# Patient Record
Sex: Male | Born: 1960
Health system: Southern US, Community
[De-identification: ages and names within clinical notes are randomized; demographics above are authoritative.]

## PROBLEM LIST (undated history)

## (undated) DIAGNOSIS — I509 Heart failure, unspecified: Secondary | ICD-10-CM

## (undated) DIAGNOSIS — I10 Essential (primary) hypertension: Secondary | ICD-10-CM

## (undated) DIAGNOSIS — E119 Type 2 diabetes mellitus without complications: Secondary | ICD-10-CM

## (undated) DIAGNOSIS — M1990 Unspecified osteoarthritis, unspecified site: Secondary | ICD-10-CM

## (undated) DIAGNOSIS — K219 Gastro-esophageal reflux disease without esophagitis: Secondary | ICD-10-CM

## (undated) HISTORY — PX: OTHER SURGICAL HISTORY: SHX169

---

## 1998-09-07 ENCOUNTER — Encounter: Payer: Self-pay | Admitting: Emergency Medicine

## 1998-09-07 ENCOUNTER — Emergency Department (HOSPITAL_COMMUNITY): Admission: EM | Admit: 1998-09-07 | Discharge: 1998-09-07 | Payer: Self-pay | Admitting: Emergency Medicine

## 2017-10-18 LAB — HM COLONOSCOPY

## 2019-03-13 ENCOUNTER — Emergency Department (HOSPITAL_COMMUNITY): Payer: Self-pay

## 2019-03-13 ENCOUNTER — Emergency Department (HOSPITAL_COMMUNITY)
Admission: EM | Admit: 2019-03-13 | Discharge: 2019-03-13 | Disposition: A | Discharge status: Home / Self Care | Payer: Self-pay | Attending: Emergency Medicine | Admitting: Emergency Medicine

## 2019-03-13 ENCOUNTER — Encounter (HOSPITAL_COMMUNITY): Payer: Self-pay | Admitting: Emergency Medicine

## 2019-03-13 ENCOUNTER — Other Ambulatory Visit: Payer: Self-pay

## 2019-03-13 DIAGNOSIS — N5089 Other specified disorders of the male genital organs: Secondary | ICD-10-CM

## 2019-03-13 DIAGNOSIS — I10 Essential (primary) hypertension: Secondary | ICD-10-CM | POA: Insufficient documentation

## 2019-03-13 DIAGNOSIS — N50812 Left testicular pain: Secondary | ICD-10-CM | POA: Insufficient documentation

## 2019-03-13 DIAGNOSIS — N50819 Testicular pain, unspecified: Secondary | ICD-10-CM

## 2019-03-13 DIAGNOSIS — F17210 Nicotine dependence, cigarettes, uncomplicated: Secondary | ICD-10-CM | POA: Insufficient documentation

## 2019-03-13 HISTORY — DX: Essential (primary) hypertension: I10

## 2019-03-13 LAB — CBC WITH DIFFERENTIAL/PLATELET
Abs Immature Granulocytes: 0.03 10*3/uL (ref 0.00–0.07)
Basophils Absolute: 0 10*3/uL (ref 0.0–0.1)
Basophils Relative: 0 %
Eosinophils Absolute: 0.1 10*3/uL (ref 0.0–0.5)
Eosinophils Relative: 1 %
HCT: 44.3 % (ref 39.0–52.0)
Hemoglobin: 14.3 g/dL (ref 13.0–17.0)
Immature Granulocytes: 0 %
Lymphocytes Relative: 36 %
Lymphs Abs: 2.6 10*3/uL (ref 0.7–4.0)
MCH: 28 pg (ref 26.0–34.0)
MCHC: 32.3 g/dL (ref 30.0–36.0)
MCV: 86.7 fL (ref 80.0–100.0)
Monocytes Absolute: 0.6 10*3/uL (ref 0.1–1.0)
Monocytes Relative: 8 %
Neutro Abs: 3.8 10*3/uL (ref 1.7–7.7)
Neutrophils Relative %: 55 %
Platelets: 284 10*3/uL (ref 150–400)
RBC: 5.11 MIL/uL (ref 4.22–5.81)
RDW: 12.8 % (ref 11.5–15.5)
WBC: 7 10*3/uL (ref 4.0–10.5)
nRBC: 0 % (ref 0.0–0.2)

## 2019-03-13 LAB — URINALYSIS, ROUTINE W REFLEX MICROSCOPIC
Bacteria, UA: NONE SEEN
Bilirubin Urine: NEGATIVE
Glucose, UA: 150 mg/dL — AB
Hgb urine dipstick: NEGATIVE
Ketones, ur: NEGATIVE mg/dL
Leukocytes,Ua: NEGATIVE
Nitrite: NEGATIVE
Protein, ur: 100 mg/dL — AB
Specific Gravity, Urine: 1.02 (ref 1.005–1.030)
pH: 5 (ref 5.0–8.0)

## 2019-03-13 LAB — COMPREHENSIVE METABOLIC PANEL
ALT: 14 U/L (ref 0–44)
AST: 18 U/L (ref 15–41)
Albumin: 3.6 g/dL (ref 3.5–5.0)
Alkaline Phosphatase: 69 U/L (ref 38–126)
Anion gap: 9 (ref 5–15)
BUN: 12 mg/dL (ref 6–20)
CO2: 24 mmol/L (ref 22–32)
Calcium: 9.2 mg/dL (ref 8.9–10.3)
Chloride: 106 mmol/L (ref 98–111)
Creatinine, Ser: 1.49 mg/dL — ABNORMAL HIGH (ref 0.61–1.24)
GFR calc Af Amer: 58 mL/min — ABNORMAL LOW (ref >60)
GFR calc non Af Amer: 50 mL/min — ABNORMAL LOW (ref >60)
Glucose, Bld: 194 mg/dL — ABNORMAL HIGH (ref 70–99)
Potassium: 4 mmol/L (ref 3.5–5.1)
Sodium: 139 mmol/L (ref 135–145)
Total Bilirubin: 0.5 mg/dL (ref 0.3–1.2)
Total Protein: 7.6 g/dL (ref 6.5–8.1)

## 2019-03-13 MED ORDER — HYDROCODONE-ACETAMINOPHEN 5-325 MG PO TABS: 1.0000 | ORAL_TABLET | Freq: Four times a day (QID) | ORAL | 0 refills | Status: DC | PRN

## 2019-03-13 MED ORDER — SULFAMETHOXAZOLE-TRIMETHOPRIM 800-160 MG PO TABS: 1.0000 | ORAL_TABLET | Freq: Two times a day (BID) | ORAL | 0 refills | Status: AC

## 2019-03-13 NOTE — Discharge Instructions (Addendum)
Return here as needed.  Call the urologist tomorrow for an appointment.

## 2019-03-13 NOTE — ED Provider Notes (Signed)
[place] EMERGENCY DEPARTMENT Provider Note   CSN: [id number] Arrival date & time: 03/13/19  1148     History   Chief Complaint Chief Complaint  Patient presents with  . Testicle Pain  . Dysuria    HPI Rodney Schultz is a 58 y.o. male.     HPI Patient presents to the emergency department with a right testicle swelling and dysuria.  Patient states he was in prison when he had surgery for boils in 2018 he states when he came out his right testicle was in the inguinal region.  Patient states he has been having intermittent pain since that time.  He states he got worse over the last 2 days.  The patient states that nothing seems to make the condition better.  Patient states that palpation makes the pain worse.  Patient states that he did not commit to having this type of procedure done and is unsure of why it was done.  The patient denies chest pain, shortness of breath, headache,blurred vision, neck pain, fever, cough, weakness, numbness, dizziness, anorexia, edema, abdominal pain, nausea, vomiting, diarrhea, rash, back pain, dysuria, hematemesis, bloody stool, near syncope, or syncope. Past Medical History:  Diagnosis Date  . Hypertension     There are no active problems to display for this patient.   Past Surgical History:  Procedure Laterality Date  . testical  Right         Home Medications    Prior to Admission medications   Medication Sig Start Date End Date Taking? Authorizing Provider  HYDROcodone-acetaminophen (NORCO/VICODIN) 5-325 MG tablet Take 1 tablet by mouth every 6 (six) hours as needed for moderate pain. 03/13/19   Emmanuelle Coxe, Harrell Gave, PA-C  sulfamethoxazole-trimethoprim (BACTRIM DS) 800-160 MG tablet Take 1 tablet by mouth 2 (two) times daily for 7 days. 03/13/19 03/20/19  Dalia Heading, PA-C    Family History No family history on file.  Social History Social History   Tobacco Use  . Smoking status: Light Tobacco Smoker  .  Smokeless tobacco: Never Used  Substance Use Topics  . Alcohol use: Not Currently  . Drug use: Not Currently     Allergies   Patient has no known allergies.   Review of Systems Review of Systems All other systems negative except as documented in the HPI. All pertinent positives and negatives as reviewed in the HPI.  Physical Exam Updated Vital Signs BP (!) 199/113   Pulse 69   Temp 98.4 F (36.9 C) (Oral)   Resp 18   SpO2 100%   Physical Exam Vitals signs and nursing note reviewed.  Constitutional:      General: He is not in acute distress.    Appearance: He is well-developed.  HENT:     Head: Normocephalic and atraumatic.  Eyes:     Pupils: Pupils are equal, round, and reactive to light.  Neck:     Musculoskeletal: Normal range of motion and neck supple.  Cardiovascular:     Rate and Rhythm: Normal rate and regular rhythm.     Heart sounds: Normal heart sounds. No murmur. No friction rub. No gallop.   Pulmonary:     Effort: Pulmonary effort is normal. No respiratory distress.     Breath sounds: Normal breath sounds. No wheezing.  Abdominal:     General: Bowel sounds are normal. There is no distension.     Palpations: Abdomen is soft.     Tenderness: There is no abdominal tenderness.  Skin:  General: Skin is warm and dry.     Capillary Refill: Capillary refill takes less than 2 seconds.     Findings: No erythema or rash.  Neurological:     Mental Status: He is alert and oriented to person, place, and time.     Motor: No abnormal muscle tone.     Coordination: Coordination normal.  Psychiatric:        Behavior: Behavior normal.      ED Treatments / Results  Labs (all labs ordered are listed, but only abnormal results are displayed) Labs Reviewed  COMPREHENSIVE METABOLIC PANEL - Abnormal; Notable for the following components:      Result Value   Glucose, Bld 194 (*)    Creatinine, Ser 1.49 (*)    GFR calc non Af Amer 50 (*)    GFR calc Af Amer 58  (*)    All other components within normal limits  URINALYSIS, ROUTINE W REFLEX MICROSCOPIC - Abnormal; Notable for the following components:   Glucose, UA 150 (*)    Protein, ur 100 (*)    All other components within normal limits  URINE CULTURE  CBC WITH DIFFERENTIAL/PLATELET    EKG None  Radiology US Scrotum W/doppler  Result Date: 03/13/2019 CLINICAL DATA:  Swelling and pain EXAM: SCROTAL ULTRASOUND DOPPLER ULTRASOUND OF THE TESTICLES TECHNIQUE: Complete ultrasound examination of the testicles, epididymis, and other scrotal structures was performed. Color and spectral Doppler ultrasound were also utilized to evaluate blood flow to the testicles. COMPARISON:  None. FINDINGS: Right testicle Measurements: 3.9 x 1.2 x 2.7 cm. Right testicle is positioned high within the right groin/inguinal canal. No mass or microlithiasis. Left testicle Measurements: 4.3 x 2.1 x 3.0 cm. No mass or microlithiasis visualized. Right epididymis:  Not visualized. Left epididymis:  Normal in size and appearance. Hydrocele:  Small left hydrocele. Varicocele:  None visualized. Pulsed Doppler interrogation of both testes demonstrates normal low resistance arterial and venous waveforms bilaterally. IMPRESSION: No evidence of testicular mass or torsion. Right testicle appears positioned within the right inguinal canal. Correlate with patient history. Nonvisualization of the right epididymis. Electronically Signed   By: Kreg Shropshire M.D.   On: 03/13/2019 14:39    Procedures Procedures (including critical care time)  Medications Ordered in ED Medications - No data to display   Initial Impression / Assessment and Plan / ED Course  I have reviewed the triage vital signs and the nursing notes.  Pertinent labs & imaging results that were available during my care of the patient were reviewed by me and considered in my medical decision making (see chart for details).       I spoke with Dr. Alvester Morin of urology who will  follow up with the patient as an outpatient.  Patient was placed on antibiotics at his request.  Told to return here for any worsening in his condition.  Patient agrees the plan and all questions were answered.     Final Clinical Impressions(s) / ED Diagnoses   Final diagnoses:  Testicular pain    ED Discharge Orders         Ordered    sulfamethoxazole-trimethoprim (BACTRIM DS) 800-160 MG tablet  2 times daily     03/13/19 1731    HYDROcodone-acetaminophen (NORCO/VICODIN) 5-325 MG tablet  Every 6 hours PRN     03/13/19 1731           Charlestine Night, PA-C 03/15/19 1524    Tegeler, Canary Brim, MD 03/15/19 [phone]

## 2019-03-13 NOTE — ED Triage Notes (Signed)
Pt reports left testicle pain with swelling and dysuria. He reports having surgery for boils while in prison last year and states "I woke up with my right testicle missing, I've been in pain ever since." Denies fever or changes in color of urine.

## 2019-03-14 LAB — URINE CULTURE: Culture: <10000 — AB

## 2019-03-17 NOTE — Progress Notes (Signed)
Patient ID: Rodney Schultz, male   DOB: Aug 03, 1960, 58 y.o.   MRN: [id number]  Virtual Visit via Telephone Note  I connected with Rodney Schultz on 03/21/19 at  8:50 AM EDT by telephone and verified that I am speaking with the correct person using two identifiers.   I discussed the limitations, risks, security and privacy concerns of performing an evaluation and management service by telephone and the availability of in person appointments. I also discussed with the patient that there may be a patient responsible charge related to this service. The patient expressed understanding and agreed to proceed.  Patient location:  home My Location:  [facility] office Persons on the call: me and the patient   History of Present Illness: After ED visit 03/13/2019 for testicular pain.  Urology was consulted.  He was prescribed sulfa.  Glucose was 150.  He has an appointment with urology tomorrow.  Pain has improved some.  Got out of prison 02/21/2019.  Last A1C was 7.0 in prison in August.  Blood sugars at home running 120-180.  No fever.  No penile discharge.  Needs financial packet.     HPI Patient presents to the emergency department with a right testicle swelling and dysuria.  Patient states he was in prison when he had surgery for boils in 2018 he states when he came out his right testicle was in the inguinal region.  Patient states he has been having intermittent pain since that time.  He states he got worse over the last 2 days.  The patient states that nothing seems to make the condition better.  Patient states that palpation makes the pain worse.  Patient states that he did not commit to having this type of procedure done and is unsure of why it was done.  The patient denies chest pain, shortness of breath, headache,blurred vision, neck pain, fever, cough, weakness, numbness, dizziness, anorexia, edema, abdominal pain, nausea, vomiting, diarrhea, rash, back pain, dysuria, hematemesis, bloody stool, near  syncope, or syncope.    Observations/Objective:  A&Ox3. NAD   Assessment and Plan: 1. Type 2 diabetes mellitus with hyperglycemia, unspecified whether long term insulin use (HCC) Not at [facility] on diet/exercise.  Last A1C=7.0 1 month ago - metFORMIN (GLUMETZA) 500 MG (MOD) 24 hr tablet; Take 2 tablets (1,000 mg total) by mouth daily with breakfast.  Dispense: 60 tablet; Refill: 3 - glipiZIDE (GLUCOTROL) 5 MG tablet; Take 1 tablet (5 mg total) by mouth daily before breakfast.  Dispense: 30 tablet; Refill: 3  2. Gastroesophageal reflux disease without esophagitis stable - omeprazole (PRILOSEC) 40 MG capsule; Take 1 capsule (40 mg total) by mouth daily.  Dispense: 30 capsule; Refill: 3  3. Encounter for examination following treatment at hospital improving  4. Testicular pain Keep appt with urology    Follow Up Instructions: Assign PCP in 2 months and check labs/A1C   I discussed the assessment and treatment plan with the patient. The patient was provided an opportunity to ask questions and all were answered. The patient agreed with the plan and demonstrated an understanding of the instructions.   The patient was advised to call back or seek an in-person evaluation if the symptoms worsen or if the condition fails to improve as anticipated.  I provided 11 minutes of non-face-to-face time during this encounter.   Freeman Caldron, PA-C

## 2019-03-21 ENCOUNTER — Other Ambulatory Visit: Payer: Self-pay

## 2019-03-21 ENCOUNTER — Other Ambulatory Visit: Payer: Self-pay | Admitting: Pharmacist

## 2019-03-21 ENCOUNTER — Ambulatory Visit: Payer: Self-pay | Attending: Family Medicine | Admitting: Physician Assistant

## 2019-03-21 DIAGNOSIS — Z09 Encounter for follow-up examination after completed treatment for conditions other than malignant neoplasm: Secondary | ICD-10-CM

## 2019-03-21 DIAGNOSIS — E1165 Type 2 diabetes mellitus with hyperglycemia: Secondary | ICD-10-CM

## 2019-03-21 DIAGNOSIS — N50819 Testicular pain, unspecified: Secondary | ICD-10-CM

## 2019-03-21 DIAGNOSIS — K219 Gastro-esophageal reflux disease without esophagitis: Secondary | ICD-10-CM

## 2019-03-21 DIAGNOSIS — E118 Type 2 diabetes mellitus with unspecified complications: Secondary | ICD-10-CM | POA: Insufficient documentation

## 2019-03-21 MED ORDER — METFORMIN HCL ER 500 MG PO TB24: 1000.0000 mg | ORAL_TABLET | Freq: Every day | ORAL | 2 refills | Status: DC

## 2019-03-21 MED ORDER — OMEPRAZOLE 40 MG PO CPDR: 40.0000 mg | DELAYED_RELEASE_CAPSULE | Freq: Every day | ORAL | 3 refills | Status: DC

## 2019-03-21 MED ORDER — GLIPIZIDE 5 MG PO TABS: 5.0000 mg | ORAL_TABLET | Freq: Every day | ORAL | 3 refills | Status: DC

## 2019-03-21 MED ORDER — METFORMIN HCL ER (MOD) 500 MG PO TB24: 1000.0000 mg | ORAL_TABLET | Freq: Every day | ORAL | 3 refills | Status: DC

## 2019-03-21 MED FILL — METFORMIN HCL ER 500 MG TB2: 500 | 30 days supply | Qty: 60 | Fill #0

## 2019-03-21 MED FILL — glipiZIDE 5 MG TABS: 5 | 30 days supply | Qty: 30 | Fill #0

## 2019-03-21 MED FILL — OMEPRAZOLE DR 40 MG CAPSULE: 40 | 30 days supply | Qty: 30 | Fill #0

## 2019-03-24 ENCOUNTER — Other Ambulatory Visit: Payer: Self-pay | Admitting: Urology

## 2019-03-31 ENCOUNTER — Telehealth: Payer: Self-pay | Admitting: General Practice

## 2019-03-31 ENCOUNTER — Ambulatory Visit: Payer: Self-pay

## 2019-03-31 NOTE — Telephone Encounter (Signed)
Attempted to leave pt VM to schedule appt

## 2019-03-31 NOTE — Telephone Encounter (Signed)
-----   Message from Debbra Riding, RMA sent at 03/21/2019  8:42 AM EDT ----- Regarding: 2 MONTHS EST CARE Please schedule this patient

## 2019-04-06 NOTE — Patient Instructions (Addendum)
DUE TO COVID-19 ONLY ONE VISITOR IS ALLOWED TO COME WITH YOU AND STAY IN THE WAITING ROOM ONLY DURING PRE OP AND PROCEDURE DAY OF SURGERY. THE 1 VISITOR MAY VISIT WITH YOU AFTER SURGERY IN YOUR PRIVATE ROOM DURING VISITING HOURS ONLY!  YOU NEED TO HAVE A COVID 19 TEST ON 04-08-19  @ 9:15 AM, THIS TEST MUST BE DONE BEFORE SURGERY, COME  [address],  [place] , [id number].  [facility] ONCE YOUR COVID TEST IS COMPLETED, PLEASE BEGIN THE QUARANTINE INSTRUCTIONS AS OUTLINED IN YOUR HANDOUT.                Rodney Schultz  Schultz   Your procedure is scheduled on:    [facility]    Report to Admitting at 8:30 AM     Call this number if you have problems the morning of surgery [phone]    Remember: Do not eat food or drink liquids :After Midnight.      Take these medicines the morning of surgery with A SIP OF WATER: Amlodipine (Norvasc),  Loratadine (Claritin), and Omeprazole (Prilosec)  BRUSH YOUR TEETH MORNING OF SURGERY AND RINSE YOUR MOUTH OUT, NO CHEWING GUM CANDY OR MINTS.  DO NOT TAKE ANY DIABETIC MEDICATIONS DAY OF YOUR SURGERY                               You may not have any metal on your body including hair pins and              piercings     Do not wear jewelry, cologne, lotions, powders or deodorant                        Men may shave face and neck.   Do not bring valuables to the hospital. [facility] IS NOT             RESPONSIBLE   FOR VALUABLES.  Contacts, dentures or bridgework may not be worn into surgery.  Leave suitcase in the car. After surgery it may be brought to your room.     Patients discharged the day of surgery will not be allowed to drive home. IF YOU ARE HAVING SURGERY AND GOING HOME THE SAME DAY, YOU MUST HAVE AN ADULT TO DRIVE YOU HOME AND BE WITH YOU FOR 24 HOURS. YOU MAY GO HOME BY TAXI OR UBER OR ORTHERWISE, BUT AN ADULT MUST ACCOMPANY YOU HOME AND STAY WITH YOU FOR 24  HOURS.    Name and phone number of your driver: Rodney AlmasWillie Schultz [phone]  Special Instructions: N/A              Please read over the following fact sheets you were given: _____________________________________________________________________             How to Manage Your Diabetes Before and After Surgery  Why is it important to control my blood sugar before and after surgery? . Improving blood sugar levels before and after surgery helps healing and can limit problems. . A way of improving blood sugar control is eating a healthy diet by: o  Eating less sugar and carbohydrates o  Increasing activity/exercise o  Talking with your doctor about reaching your blood sugar goals . High blood sugars (greater than 180 mg/dL) can raise your risk of infections and slow your recovery, so you will need  to focus on controlling your diabetes during the weeks before surgery. . Make sure that the doctor who takes care of your diabetes knows about your planned surgery including the date and location.  How do I manage my blood sugar before surgery? . Check your blood sugar at least 4 times a day, starting 2 days before surgery, to make sure that the level is not too high or low. o Check your blood sugar the morning of your surgery when you wake up and every 2 hours until you get to the Short Stay unit. . If your blood sugar is less than 70 mg/dL, you will need to treat for low blood sugar: o Do not take insulin. o Treat a low blood sugar (less than 70 mg/dL) with  cup of clear juice (cranberry or apple), 4 glucose tablets, OR glucose gel. o Recheck blood sugar in 15 minutes after treatment (to make sure it is greater than 70 mg/dL). If your blood sugar is not greater than 70 mg/dL on recheck, call [phone] for further instructions. . Report your blood sugar to the short stay nurse when you get to Short Stay.  . If you are admitted to the hospital after surgery: o Your blood sugar will be  checked by the staff and you will probably be given insulin after surgery (instead of oral diabetes medicines) to make sure you have good blood sugar levels. o The goal for blood sugar control after surgery is 80-180 mg/dL.   WHAT DO I DO ABOUT MY DIABETES MEDICATION?  Marland Kitchen Do not take oral diabetes medicines (pills) the morning of surgery.  . THE DAY BEFORE SURGERY, take your usual dose of Actos and Metformin, but only your morning/lunch dose of Glipizide       Reviewed and Endorsed by [facility] Patient Education Committee, August 2015   [facility] - Preparing for Surgery Before surgery, you can play an important role.  Because skin is not sterile, your skin needs to be as free of germs as possible.  You can reduce the number of germs on your skin by washing with CHG (chlorahexidine gluconate) soap before surgery.  CHG is an antiseptic cleaner which kills germs and bonds with the skin to continue killing germs even after washing. Please DO NOT use if you have an allergy to CHG or antibacterial soaps.  If your skin becomes reddened/irritated stop using the CHG and inform your nurse when you arrive at Short Stay. Do not shave (including legs and underarms) for at least 48 hours prior to the first CHG shower.  You may shave your face/neck. Please follow these instructions carefully:  1.  Shower with CHG Soap the night before surgery and the  morning of Surgery.  2.  If you choose to wash your hair, wash your hair first as usual with your  normal  shampoo.  3.  After you shampoo, rinse your hair and body thoroughly to remove the  shampoo.                           4.  Use CHG as you would any other liquid soap.  You can apply chg directly  to the skin and wash                       Gently with a scrungie or clean washcloth.  5.  Apply the CHG Soap to your body ONLY FROM THE NECK  DOWN.   Do not use on face/ open                           Wound or open sores. Avoid contact with eyes, ears mouth  and genitals (private parts).                       Wash face,  Genitals (private parts) with your normal soap.             6.  Wash thoroughly, paying special attention to the area where your surgery  will be performed.  7.  Thoroughly rinse your body with warm water from the neck down.  8.  DO NOT shower/wash with your normal soap after using and rinsing off  the CHG Soap.                9.  Pat yourself dry with a clean towel.            10.  Wear clean pajamas.            11.  Place clean sheets on your bed the night of your first shower and do not  sleep with pets. Day of Surgery : Do not apply any lotions/deodorants the morning of surgery.  Please wear clean clothes to the hospital/surgery center.  FAILURE TO FOLLOW THESE INSTRUCTIONS MAY RESULT IN THE CANCELLATION OF YOUR SURGERY PATIENT SIGNATURE_________________________________  NURSE SIGNATURE__________________________________  ________________________________________________________________________

## 2019-04-06 NOTE — Progress Notes (Addendum)
PCP - Pt did not know the name of the provider. Only the location of the clinic Cardiologist -   Chest x-ray -  EKG - 04-07-19 Stress Test -  ECHO -  Cardiac Cath -   Sleep Study -  CPAP -   Fasting Blood Sugar - 338 Checks Blood Sugar _____ times a day (Pt doesnot check blood glucose  Blood Thinner Instructions: Aspirin Instructions: Last Dose:  Anesthesia review:  HGA1C 8.6  Patient denies shortness of breath, fever, cough and chest pain at PAT appointment   Patient verbalized understanding of instructions that were given to them at the PAT appointment. Patient was also instructed that they will need to review over the PAT instructions again at home before surgery.

## 2019-04-07 ENCOUNTER — Other Ambulatory Visit: Payer: Self-pay

## 2019-04-07 ENCOUNTER — Encounter (HOSPITAL_COMMUNITY)
Admission: RE | Admit: 2019-04-07 | Discharge: 2019-04-07 | Disposition: A | Discharge status: Home / Self Care | Payer: Self-pay | Source: Ambulatory Visit | Attending: Urology | Admitting: Urology

## 2019-04-07 ENCOUNTER — Encounter (HOSPITAL_COMMUNITY): Payer: Self-pay

## 2019-04-07 DIAGNOSIS — I10 Essential (primary) hypertension: Secondary | ICD-10-CM | POA: Insufficient documentation

## 2019-04-07 DIAGNOSIS — Z01818 Encounter for other preprocedural examination: Secondary | ICD-10-CM | POA: Insufficient documentation

## 2019-04-07 DIAGNOSIS — Q5522 Retractile testis: Secondary | ICD-10-CM | POA: Insufficient documentation

## 2019-04-07 DIAGNOSIS — E118 Type 2 diabetes mellitus with unspecified complications: Secondary | ICD-10-CM | POA: Insufficient documentation

## 2019-04-07 DIAGNOSIS — N50811 Right testicular pain: Secondary | ICD-10-CM | POA: Insufficient documentation

## 2019-04-07 HISTORY — DX: Type 2 diabetes mellitus without complications: E11.9

## 2019-04-07 HISTORY — DX: Gastro-esophageal reflux disease without esophagitis: K21.9

## 2019-04-07 LAB — CBC
HCT: 45.5 % (ref 39.0–52.0)
Hemoglobin: 14.9 g/dL (ref 13.0–17.0)
MCH: 28.2 pg (ref 26.0–34.0)
MCHC: 32.7 g/dL (ref 30.0–36.0)
MCV: 86 fL (ref 80.0–100.0)
Platelets: 270 10*3/uL (ref 150–400)
RBC: 5.29 MIL/uL (ref 4.22–5.81)
RDW: 12.7 % (ref 11.5–15.5)
WBC: 6.5 10*3/uL (ref 4.0–10.5)
nRBC: 0 % (ref 0.0–0.2)

## 2019-04-07 LAB — BASIC METABOLIC PANEL
Anion gap: 9 (ref 5–15)
BUN: 13 mg/dL (ref 6–20)
CO2: 27 mmol/L (ref 22–32)
Calcium: 9.3 mg/dL (ref 8.9–10.3)
Chloride: 102 mmol/L (ref 98–111)
Creatinine, Ser: 1.35 mg/dL — ABNORMAL HIGH (ref 0.61–1.24)
GFR calc Af Amer: >60 mL/min (ref >60)
GFR calc non Af Amer: 57 mL/min — ABNORMAL LOW (ref >60)
Glucose, Bld: 343 mg/dL — ABNORMAL HIGH (ref 70–99)
Potassium: 4.3 mmol/L (ref 3.5–5.1)
Sodium: 138 mmol/L (ref 135–145)

## 2019-04-07 LAB — GLUCOSE, CAPILLARY: Glucose-Capillary: 338 mg/dL — ABNORMAL HIGH (ref 70–99)

## 2019-04-07 NOTE — Progress Notes (Signed)
Elevated BP reading discussed with Iver Nestle, PA. Pt advised per Janett Billow to contact his PCP to see if BP medication can be adjusted, as surgery can potentially be cancelled if BP reading is elevated the day of surgery. Pt indicated that he is scheduled to see his PCP next week after surgery, but verbalized understanding of instructions provided.   Janett Billow indicated that she would also try to contact PCP, and surgeon's office as well.

## 2019-04-08 ENCOUNTER — Other Ambulatory Visit (HOSPITAL_COMMUNITY)
Admission: RE | Admit: 2019-04-08 | Discharge: 2019-04-08 | Disposition: A | Discharge status: Home / Self Care | Payer: HRSA Program | Source: Ambulatory Visit | Attending: Urology | Admitting: Urology

## 2019-04-08 DIAGNOSIS — Z20828 Contact with and (suspected) exposure to other viral communicable diseases: Secondary | ICD-10-CM | POA: Diagnosis not present

## 2019-04-08 DIAGNOSIS — Z01812 Encounter for preprocedural laboratory examination: Secondary | ICD-10-CM | POA: Diagnosis not present

## 2019-04-08 LAB — HEMOGLOBIN A1C
Hgb A1c MFr Bld: 8.6 % — ABNORMAL HIGH (ref 4.8–5.6)
Mean Plasma Glucose: 200 mg/dL

## 2019-04-09 LAB — NOVEL CORONAVIRUS, NAA (HOSP ORDER, SEND-OUT TO REF LAB; TAT 18-24 HRS): SARS-CoV-2, NAA: NOT DETECTED

## 2019-04-10 ENCOUNTER — Encounter (HOSPITAL_COMMUNITY): Payer: Self-pay | Admitting: Physician Assistant

## 2019-04-10 NOTE — Progress Notes (Addendum)
04-07-19 HGA1C result routed to Dr. Gloriann Loan for review

## 2019-04-11 NOTE — Anesthesia Preprocedure Evaluation (Deleted)
Anesthesia Evaluation    Airway        Dental   Pulmonary Current Smoker,           Cardiovascular hypertension,      Neuro/Psych    GI/Hepatic   Endo/Other  diabetes  Renal/GU      Musculoskeletal   Abdominal   Peds  Hematology   Anesthesia Other Findings   Reproductive/Obstetrics                             Anesthesia Physical Anesthesia Plan  ASA:   Anesthesia Plan:    Post-op Pain Management:    Induction:   PONV Risk Score and Plan:   Airway Management Planned:   Additional Equipment:   Intra-op Plan:   Post-operative Plan:   Informed Consent:   Plan Discussed with:   Anesthesia Plan Comments: (See PAT note 04/07/2019, Konrad Felix, PA-C)        Anesthesia Quick Evaluation

## 2019-04-11 NOTE — Progress Notes (Signed)
Anesthesia Chart Review   Case: [id number] Date/Time: 04/12/19 1015   Procedure: RIGHT GROIN  EXPLORATION POSSIBLE RIGHT ORCHIOPEXY POSSIBLE RIGHT ORCHIECTOMY (Right )   Anesthesia type: General   Pre-op diagnosis: RIGHT TESTICULAR PAIN   Location: [place] / [facility]   Surgeon: Lucas Mallow, MD      DISCUSSION:58 y.o. light tobacco smoker with h/o HTN, GERD, DM II, right testicular pain scheduled for above procedure 04/12/2019 with Dr. Link Snuffer.    Elevated BP at PAT visit.  Pt reports he was recently released from prison, is establishing care with PCP week after surgery.  Nurse advised pt to contact PCP to see if he could get in earlier for evaluation of BP.    Discussed with Dr. Marcell Barlow.  Unless case is emergent should be postponed until HTN and poorly controlled diabetes are better managed.  Voicemail left with Dr. Purvis Sheffield scheduler, informed case may be cancelled day of due to BP or CBG.   VS: BP (!) 213/116   Pulse 99   Temp 36.8 C (Oral)   Resp 18   Ht 5\' 10"  (1.778 m)   Wt 94.4 kg   SpO2 100%   BMI 29.87 kg/m   PROVIDERS: Patient, No Pcp Per   LABS: Surgeon made aware of blood glucose (all labs ordered are listed, but only abnormal results are displayed)  Labs Reviewed  BASIC METABOLIC PANEL - Abnormal; Notable for the following components:      Result Value   Glucose, Bld 343 (*)    Creatinine, Ser 1.35 (*)    GFR calc non Af Amer 57 (*)    All other components within normal limits  HEMOGLOBIN A1C - Abnormal; Notable for the following components:   Hgb A1c MFr Bld 8.6 (*)    All other components within normal limits  GLUCOSE, CAPILLARY - Abnormal; Notable for the following components:   Glucose-Capillary 338 (*)    All other components within normal limits  CBC     IMAGES:   EKG: 04/07/2019 Rate 74 bpm Normal sinus rhythm  Normal ECG   CV:  Past Medical History:  Diagnosis Date  . Diabetes mellitus without complication ([place])   .  GERD (gastroesophageal reflux disease)   . Hypertension     Past Surgical History:  Procedure Laterality Date  . testical  Right     MEDICATIONS: . amLODipine (NORVASC) 10 MG tablet  . glipiZIDE (GLUCOTROL) 10 MG tablet  . glipiZIDE (GLUCOTROL) 5 MG tablet  . HYDROcodone-acetaminophen (NORCO/VICODIN) 5-325 MG tablet  . loratadine (CLARITIN) 10 MG tablet  . Menthol-Methyl Salicylate (MUSCLE RUB EX)  . metFORMIN (GLUCOPHAGE-XR) 500 MG 24 hr tablet  . omeprazole (PRILOSEC) 20 MG capsule  . omeprazole (PRILOSEC) 40 MG capsule  . pioglitazone (ACTOS) 15 MG tablet   No current facility-administered medications for this encounter.      Maia Plan [facility] Pre-Surgical Testing [phone] 04/11/19 1:23 PM

## 2019-04-12 ENCOUNTER — Ambulatory Visit (HOSPITAL_COMMUNITY): Admission: RE | Admit: 2019-04-12 | Payer: Self-pay | Source: Home / Self Care | Admitting: Urology

## 2019-04-12 ENCOUNTER — Ambulatory Visit (HOSPITAL_COMMUNITY)
Admission: EM | Admit: 2019-04-12 | Discharge: 2019-04-12 | Disposition: A | Discharge status: Home / Self Care | Payer: Self-pay | Attending: Family Medicine | Admitting: Family Medicine

## 2019-04-12 ENCOUNTER — Encounter (HOSPITAL_COMMUNITY): Payer: Self-pay | Admitting: Emergency Medicine

## 2019-04-12 ENCOUNTER — Encounter (HOSPITAL_COMMUNITY): Admission: RE | Payer: Self-pay | Source: Home / Self Care

## 2019-04-12 ENCOUNTER — Other Ambulatory Visit: Payer: Self-pay

## 2019-04-12 DIAGNOSIS — E1165 Type 2 diabetes mellitus with hyperglycemia: Secondary | ICD-10-CM

## 2019-04-12 DIAGNOSIS — I10 Essential (primary) hypertension: Secondary | ICD-10-CM

## 2019-04-12 LAB — GLUCOSE, CAPILLARY: Glucose-Capillary: 411 mg/dL — ABNORMAL HIGH (ref 70–99)

## 2019-04-12 SURGERY — EXPLORATION, SCROTUM
Anesthesia: General | Laterality: Right

## 2019-04-12 MED ORDER — METFORMIN HCL ER 500 MG PO TB24: 1000.0000 mg | ORAL_TABLET | Freq: Two times a day (BID) | ORAL | 3 refills | Status: DC

## 2019-04-12 MED ORDER — INSULIN NPH ISOPHANE & REGULAR (70-30) 100 UNIT/ML ~~LOC~~ SUSP: 20.0000 [IU] | Freq: Every day | SUBCUTANEOUS | 11 refills | Status: DC

## 2019-04-12 MED ORDER — AMLODIPINE BESYLATE 10 MG PO TABS: 10.0000 mg | ORAL_TABLET | Freq: Every day | ORAL | 3 refills | Status: DC

## 2019-04-12 MED ORDER — "INSULIN SYRINGE 27G X 1/2"" 0.5 ML MISC": 1.0000 | Freq: Every day | 3 refills | Status: DC

## 2019-04-12 MED ORDER — GLIPIZIDE 10 MG PO TABS: 10.0000 mg | ORAL_TABLET | Freq: Every day | ORAL | 3 refills | Status: DC

## 2019-04-12 MED ORDER — PRAVASTATIN SODIUM 10 MG PO TABS: 10.0000 mg | ORAL_TABLET | Freq: Every day | ORAL | 3 refills | Status: DC

## 2019-04-12 NOTE — ED Triage Notes (Signed)
Pt presents to [facility] for assessment of hyperglycemia (390 this morning) and hypertension (denies headaches, weakness or dizziness).  States he just got out of prison and is out of his medications.

## 2019-04-12 NOTE — ED Provider Notes (Signed)
[facility]    CSN: [id number] Arrival date & time: 04/12/19  1058      History   Chief Complaint Chief Complaint  Patient presents with  . Hypertension  . Hyperglycemia    HPI Rodney Schultz is a 58 y.o. male.   This is the initial visit for this hypertensive diabetic patient to the [facility] urgent care office.  Pt presents to [facility] for assessment of hyperglycemia (390 this morning) and hypertension (denies headaches, weakness or dizziness).  States he just got out of prison and is out of his medications.      Past Medical History:  Diagnosis Date  . Diabetes mellitus without complication (HCC)   . GERD (gastroesophageal reflux disease)   . Hypertension     Patient Active Problem List   Diagnosis Date Noted  . Type 2 diabetes mellitus with hyperglycemia (HCC) 03/21/2019    Past Surgical History:  Procedure Laterality Date  . testical  Right        Home Medications    Prior to Admission medications   Medication Sig Start Date End Date Taking? Authorizing Provider  amLODipine (NORVASC) 10 MG tablet Take 1 tablet (10 mg total) by mouth daily. 04/12/19   Elvina Sidle, MD  glipiZIDE (GLUCOTROL) 10 MG tablet Take 1 tablet (10 mg total) by mouth daily before breakfast. 04/12/19   Elvina Sidle, MD  insulin NPH-regular Human (70-30) 100 UNIT/ML injection Inject 20 Units into the skin daily with breakfast. 04/12/19   Elvina Sidle, MD  Insulin Syringe 27G X 1/2" 0.5 ML MISC 1 Syringe by Does not apply route daily. 04/12/19   Elvina Sidle, MD  loratadine (CLARITIN) 10 MG tablet Take 10 mg by mouth daily as needed for allergies.    [provider]  Menthol-Methyl Salicylate (MUSCLE RUB EX) Apply 1 application topically 2 (two) times daily as needed (leg pain).    [provider]  metFORMIN (GLUCOPHAGE-XR) 500 MG 24 hr tablet Take 2 tablets (1,000 mg total) by mouth 2 (two) times daily. 04/12/19   Elvina Sidle, MD  pravastatin  (PRAVACHOL) 10 MG tablet Take 1 tablet (10 mg total) by mouth daily. Patient unsure of dose at this time 04/12/19   Elvina Sidle, MD  omeprazole (PRILOSEC) 20 MG capsule Take 20 mg by mouth daily.  04/12/19  [provider]  pioglitazone (ACTOS) 15 MG tablet Take 15 mg by mouth daily.  04/12/19  [provider]    Family History History reviewed. No pertinent family history.  Social History Social History   Tobacco Use  . Smoking status: Light Tobacco Smoker    Packs/day: 0.10  . Smokeless tobacco: Never Used  . Tobacco comment: Last smoked on 04/03/19. Pt reports that he only smoke when he is in pain  Substance Use Topics  . Alcohol use: Not Currently  . Drug use: Not Currently     Allergies   Patient has no known allergies.   Review of Systems Review of Systems   Physical Exam Triage Vital Signs ED Triage Vitals [04/12/19 1111]  Enc Vitals Group     BP (!) 185/119     Pulse Rate 98     Resp 18     Temp 97.6 F (36.4 C)     Temp Source Oral     SpO2 100 %     Weight      Height      Head Circumference      Peak Flow  Pain Score 0     Pain Loc      Pain Edu?      Excl. in [place]?    No data found.  Updated Vital Signs BP (!) 185/119 (BP Location: Right Arm)   Pulse 98   Temp 97.6 F (36.4 C) (Oral)   Resp 18   SpO2 100%    Physical Exam   UC Treatments / Results  Labs (all labs ordered are listed, but only abnormal results are displayed) Labs Reviewed  GLUCOSE, CAPILLARY - Abnormal; Notable for the following components:      Result Value   Glucose-Capillary 411 (*)    All other components within normal limits  CBG MONITORING, ED    EKG   Radiology No results found.  Procedures Procedures (including critical care time)  Medications Ordered in UC Medications - No data to display  Initial Impression / Assessment and Plan / UC Course  I have reviewed the triage vital signs and the nursing notes.  Pertinent labs &  imaging results that were available during my care of the patient were reviewed by me and considered in my medical decision making (see chart for details).    Final Clinical Impressions(s) / UC Diagnoses   Final diagnoses:  Essential hypertension  Uncontrolled type 2 diabetes mellitus with hyperglycemia [facility])   Discharge Instructions   None    ED Prescriptions    Medication Sig Dispense Auth. Provider   amLODipine (NORVASC) 10 MG tablet Take 1 tablet (10 mg total) by mouth daily. 90 tablet Robyn Haber, MD   glipiZIDE (GLUCOTROL) 10 MG tablet Take 1 tablet (10 mg total) by mouth daily before breakfast. 90 tablet Robyn Haber, MD   insulin NPH-regular Human (70-30) 100 UNIT/ML injection Inject 20 Units into the skin daily with breakfast. 10 mL Robyn Haber, MD   metFORMIN (GLUCOPHAGE-XR) 500 MG 24 hr tablet Take 2 tablets (1,000 mg total) by mouth 2 (two) times daily. 180 tablet Robyn Haber, MD   pravastatin (PRAVACHOL) 10 MG tablet Take 1 tablet (10 mg total) by mouth daily. Patient unsure of dose at this time 90 tablet Teffany Blaszczyk, Synetta Shadow, MD   Insulin Syringe 27G X 1/2" 0.5 ML MISC 1 Syringe by Does not apply route daily. 100 each Robyn Haber, MD     I have reviewed the PDMP during this encounter.   Robyn Haber, MD 04/12/19 1144

## 2019-04-28 ENCOUNTER — Ambulatory Visit (HOSPITAL_COMMUNITY)
Admission: EM | Admit: 2019-04-28 | Discharge: 2019-04-28 | Disposition: A | Discharge status: Home / Self Care | Payer: Self-pay | Attending: Family Medicine | Admitting: Family Medicine

## 2019-04-28 ENCOUNTER — Encounter (HOSPITAL_COMMUNITY): Payer: Self-pay

## 2019-04-28 ENCOUNTER — Other Ambulatory Visit: Payer: Self-pay

## 2019-04-28 DIAGNOSIS — Z794 Long term (current) use of insulin: Secondary | ICD-10-CM

## 2019-04-28 DIAGNOSIS — E119 Type 2 diabetes mellitus without complications: Secondary | ICD-10-CM

## 2019-04-28 DIAGNOSIS — I10 Essential (primary) hypertension: Secondary | ICD-10-CM

## 2019-04-28 MED ORDER — TRIAMTERENE-HCTZ 37.5-25 MG PO TABS: 1.0000 | ORAL_TABLET | Freq: Every day | ORAL | 3 refills | Status: DC

## 2019-04-28 NOTE — ED Triage Notes (Signed)
Pt presents for follow up on elevated blood sugar and elevated blood pressure since last visit on 09/23; pt has not been able [facility].

## 2019-04-28 NOTE — ED Provider Notes (Signed)
[facility]    CSN: [id number] Arrival date & time: 04/28/19  4098      History   Chief Complaint Chief Complaint  Patient presents with   Elevated Blood Sugar   Hypertension    HPI Rodney Schultz is a 58 y.o. male.   Pt presents for follow up on elevated blood sugar and elevated blood pressure since last visit on 09/23; pt has not been able to establish [facility] care yet at [facility].  Patient is feeling much better.  Sugars are running 69-125.  Blood pressure still high (150/98 recently) and patient eager to have right inguinal hernia repaired.  No chest pain or shortness of breath.  No headaches.  Walking miles daily.  Living with mother and brother who has stage IV lung cancer.      Past Medical History:  Diagnosis Date   Diabetes mellitus without complication (HCC)    GERD (gastroesophageal reflux disease)    Hypertension     Patient Active Problem List   Diagnosis Date Noted   Type 2 diabetes mellitus with hyperglycemia (HCC) 03/21/2019    Past Surgical History:  Procedure Laterality Date   testical  Right        Home Medications    Prior to Admission medications   Medication Sig Start Date End Date Taking? Authorizing Provider  amLODipine (NORVASC) 10 MG tablet Take 1 tablet (10 mg total) by mouth daily. 04/12/19   Elvina Sidle, MD  glipiZIDE (GLUCOTROL) 10 MG tablet Take 1 tablet (10 mg total) by mouth daily before breakfast. 04/12/19   Elvina Sidle, MD  insulin NPH-regular Human (70-30) 100 UNIT/ML injection Inject 20 Units into the skin daily with breakfast. 04/12/19   Elvina Sidle, MD  Insulin Syringe 27G X 1/2" 0.5 ML MISC 1 Syringe by Does not apply route daily. 04/12/19   Elvina Sidle, MD  loratadine (CLARITIN) 10 MG tablet Take 10 mg by mouth daily as needed for allergies.    [provider]  Menthol-Methyl Salicylate (MUSCLE RUB EX) Apply 1 application topically 2 (two) times daily as needed  (leg pain).    [provider]  metFORMIN (GLUCOPHAGE-XR) 500 MG 24 hr tablet Take 2 tablets (1,000 mg total) by mouth 2 (two) times daily. 04/12/19   Elvina Sidle, MD  pravastatin (PRAVACHOL) 10 MG tablet Take 1 tablet (10 mg total) by mouth daily. Patient unsure of dose at this time 04/12/19   Elvina Sidle, MD  triamterene-hydrochlorothiazide (MAXZIDE-25) 37.5-25 MG tablet Take 1 tablet by mouth daily. 04/28/19   Elvina Sidle, MD  omeprazole (PRILOSEC) 20 MG capsule Take 20 mg by mouth daily.  04/12/19  [provider]  pioglitazone (ACTOS) 15 MG tablet Take 15 mg by mouth daily.  04/12/19  [provider]    Family History Family History  Family history unknown: Yes    Social History Social History   Tobacco Use   Smoking status: Light Tobacco Smoker    Packs/day: 0.10   Smokeless tobacco: Never Used   Tobacco comment: Last smoked on 04/03/19. Pt reports that he only smoke when he is in pain  Substance Use Topics   Alcohol use: Not Currently   Drug use: Not Currently     Allergies   Patient has no known allergies.   Review of Systems Review of Systems  All other systems reviewed and are negative.    Physical Exam Triage Vital Signs ED Triage Vitals  Enc Vitals Group  BP 04/28/19 0830 (!) 168/114     Pulse Rate 04/28/19 0830 (!) 114     Resp 04/28/19 0830 16     Temp 04/28/19 0830 97.9 F (36.6 C)     Temp Source 04/28/19 0830 Temporal     SpO2 04/28/19 0830 98 %     Weight --      Height --      Head Circumference --      Peak Flow --      Pain Score 04/28/19 0833 2     Pain Loc --      Pain Edu? --      Excl. in [place]? --    No data found.  Updated Vital Signs BP (!) 168/114 (BP Location: Left Arm)    Pulse (!) 114    Temp 97.9 F (36.6 C) (Temporal)    Resp 16    SpO2 98%   Physical Exam Vitals signs and nursing note reviewed.  Constitutional:      General: He is not in acute distress.    Appearance: Normal  appearance. He is normal weight. He is not toxic-appearing.  HENT:     Head: Normocephalic.  Eyes:     Conjunctiva/sclera: Conjunctivae normal.  Neck:     Musculoskeletal: Normal range of motion and neck supple.  Cardiovascular:     Rate and Rhythm: Normal rate and regular rhythm.  Pulmonary:     Effort: Pulmonary effort is normal.     Breath sounds: Normal breath sounds.  Abdominal:     Hernia: A hernia is present.  Musculoskeletal: Normal range of motion.  Skin:    General: Skin is warm and dry.  Neurological:     General: No focal deficit present.     Mental Status: He is alert and oriented to person, place, and time.  Psychiatric:        Mood and Affect: Mood normal.        Behavior: Behavior normal.        Thought Content: Thought content normal.      UC Treatments / Results  Labs (all labs ordered are listed, but only abnormal results are displayed) Labs Reviewed - No data to display  EKG   Radiology No results found.  Procedures Procedures (including critical care time)  Medications Ordered in UC Medications - No data to display  Initial Impression / Assessment and Plan / UC Course  I have reviewed the triage vital signs and the nursing notes.  Pertinent labs & imaging results that were available during my care of the patient were reviewed by me and considered in my medical decision making (see chart for details).     Final Clinical Impressions(s) / UC Diagnoses   Final diagnoses:  Essential hypertension  Controlled type 2 diabetes mellitus without complication, with long-term current use of insulin [facility])   Discharge Instructions   None    ED Prescriptions    Medication Sig Dispense Auth. Provider   triamterene-hydrochlorothiazide (MAXZIDE-25) 37.5-25 MG tablet Take 1 tablet by mouth daily. 90 tablet Robyn Haber, MD     PDMP not reviewed this encounter.   Robyn Haber, MD 04/28/19 [phone]

## 2019-06-13 ENCOUNTER — Ambulatory Visit (HOSPITAL_COMMUNITY)
Admission: EM | Admit: 2019-06-13 | Discharge: 2019-06-13 | Disposition: A | Discharge status: Home / Self Care | Payer: Self-pay | Attending: Family Medicine | Admitting: Family Medicine

## 2019-06-13 ENCOUNTER — Other Ambulatory Visit: Payer: Self-pay

## 2019-06-13 ENCOUNTER — Encounter (HOSPITAL_COMMUNITY): Payer: Self-pay | Admitting: Family Medicine

## 2019-06-13 DIAGNOSIS — I10 Essential (primary) hypertension: Secondary | ICD-10-CM

## 2019-06-13 MED ORDER — CHLORTHALIDONE 25 MG PO TABS: 25.0000 mg | ORAL_TABLET | Freq: Every day | ORAL | 3 refills | Status: DC

## 2019-06-13 MED ORDER — LOSARTAN POTASSIUM 100 MG PO TABS: 100.0000 mg | ORAL_TABLET | Freq: Every day | ORAL | 3 refills | Status: DC

## 2019-06-13 NOTE — ED Triage Notes (Signed)
Patient presents to Urgent Care with complaints of needing his blood pressure rechecked since having a high reading last month. Patient reports he was put on new medication and told to come back for follow up.

## 2019-06-13 NOTE — ED Provider Notes (Signed)
[facility]    CSN: [id number] Arrival date & time: 06/13/19  9485      History   Chief Complaint Chief Complaint  Patient presents with   Blood Pressure Check    HPI Rodney Schultz is a 58 y.o. male.   58 year old established [facility] urgent care patient who comes in for evaluation of blood pressure.  He is waiting to have good control of his blood pressure so he can proceed with a right inguinal hernia repair.  Patient also has diabetes mellitus.  Patient's been out of blood pressure medicine for several days.  He says that the last prescription cost him $20.  His blood sugars have been running low normal.  Patient shared with me that he had an operation while he was in prison on his right groin to remove his right testicle.  He does not feel that he had any problem with that testicle to begin with but that the surgeon operating on him took advantage of him without him having signed any consent.  He is having continued pain and swelling in that right groin.     Past Medical History:  Diagnosis Date   Diabetes mellitus without complication (HCC)    GERD (gastroesophageal reflux disease)    Hypertension     Patient Active Problem List   Diagnosis Date Noted   Type 2 diabetes mellitus with hyperglycemia ([place]) 03/21/2019    Past Surgical History:  Procedure Laterality Date   testical  Right        Home Medications    Prior to Admission medications   Medication Sig Start Date End Date Taking? Authorizing Provider  amLODipine (NORVASC) 10 MG tablet Take 1 tablet (10 mg total) by mouth daily. 04/12/19 06/13/19 Yes Robyn Haber, MD  chlorthalidone (HYGROTON) 25 MG tablet Take 1 tablet (25 mg total) by mouth daily. 06/13/19   Robyn Haber, MD  glipiZIDE (GLUCOTROL) 10 MG tablet Take 1 tablet (10 mg total) by mouth daily before breakfast. 04/12/19   Robyn Haber, MD  insulin NPH-regular Human (70-30) 100 UNIT/ML injection Inject 20 Units  into the skin daily with breakfast. 04/12/19   Robyn Haber, MD  Insulin Syringe 27G X 1/2" 0.5 ML MISC 1 Syringe by Does not apply route daily. 04/12/19   Robyn Haber, MD  loratadine (CLARITIN) 10 MG tablet Take 10 mg by mouth daily as needed for allergies.    [provider]  losartan (COZAAR) 100 MG tablet Take 1 tablet (100 mg total) by mouth daily. 06/13/19   Robyn Haber, MD  Menthol-Methyl Salicylate (MUSCLE RUB EX) Apply 1 application topically 2 (two) times daily as needed (leg pain).    [provider]  metFORMIN (GLUCOPHAGE-XR) 500 MG 24 hr tablet Take 2 tablets (1,000 mg total) by mouth 2 (two) times daily. 04/12/19   Robyn Haber, MD  pravastatin (PRAVACHOL) 10 MG tablet Take 1 tablet (10 mg total) by mouth daily. Patient unsure of dose at this time 04/12/19   Robyn Haber, MD  omeprazole (PRILOSEC) 20 MG capsule Take 20 mg by mouth daily.  04/12/19  [provider]  pioglitazone (ACTOS) 15 MG tablet Take 15 mg by mouth daily.  04/12/19  [provider]  triamterene-hydrochlorothiazide (MAXZIDE-25) 37.5-25 MG tablet Take 1 tablet by mouth daily. 04/28/19 06/13/19  Robyn Haber, MD    Family History Family History  Problem Relation Age of Onset   Healthy Mother     Social History Social History   Tobacco  Use   Smoking status: Light Tobacco Smoker    Packs/day: 0.10   Smokeless tobacco: Never Used   Tobacco comment: Last smoked on 04/03/19. Pt reports that he only smoke when he is in pain  Substance Use Topics   Alcohol use: Not Currently   Drug use: Not Currently     Allergies   Patient has no known allergies.   Review of Systems Review of Systems   Physical Exam Triage Vital Signs ED Triage Vitals  Enc Vitals Group     BP      Pulse      Resp      Temp      Temp src      SpO2      Weight      Height      Head Circumference      Peak Flow      Pain Score      Pain Loc      Pain Edu?       Excl. in [place]?    No data found.  Updated Vital Signs BP (!) 157/110 (BP Location: Right Arm)    Pulse 89    Temp 98.5 F (36.9 C) (Oral)    Resp 15    SpO2 100%    Physical Exam Vitals signs and nursing note reviewed.  Constitutional:      General: He is not in acute distress.    Appearance: Normal appearance. He is normal weight. He is not toxic-appearing.  Neck:     Musculoskeletal: Normal range of motion and neck supple.  Pulmonary:     Effort: Pulmonary effort is normal.  Abdominal:     Hernia: A hernia is present.     Comments: Tender right inguinal swelling  Musculoskeletal: Normal range of motion.  Skin:    General: Skin is warm and dry.  Neurological:     General: No focal deficit present.     Mental Status: He is alert.  Psychiatric:        Mood and Affect: Mood normal.      UC Treatments / Results  Labs (all labs ordered are listed, but only abnormal results are displayed) Labs Reviewed - No data to display  EKG   Radiology No results found.  Procedures Procedures (including critical care time)  Medications Ordered in UC Medications - No data to display  Initial Impression / Assessment and Plan / UC Course  I have reviewed the triage vital signs and the nursing notes.  Pertinent labs & imaging results that were available during my care of the patient were reviewed by me and considered in my medical decision making (see chart for details).    Final Clinical Impressions(s) / UC Diagnoses   Final diagnoses:  Essential hypertension   Discharge Instructions   None    ED Prescriptions    Medication Sig Dispense Auth. Provider   chlorthalidone (HYGROTON) 25 MG tablet Take 1 tablet (25 mg total) by mouth daily. 90 tablet Elvina Sidle, MD   losartan (COZAAR) 100 MG tablet Take 1 tablet (100 mg total) by mouth daily. 90 tablet Elvina Sidle, MD     I have reviewed the PDMP during this encounter.   Elvina Sidle, MD 06/13/19 [phone]

## 2019-07-12 ENCOUNTER — Ambulatory Visit (HOSPITAL_COMMUNITY)
Admission: EM | Admit: 2019-07-12 | Discharge: 2019-07-12 | Disposition: A | Discharge status: Home / Self Care | Payer: Self-pay | Attending: Family Medicine | Admitting: Family Medicine

## 2019-07-12 ENCOUNTER — Encounter (HOSPITAL_COMMUNITY): Payer: Self-pay

## 2019-07-12 ENCOUNTER — Other Ambulatory Visit: Payer: Self-pay

## 2019-07-12 DIAGNOSIS — I10 Essential (primary) hypertension: Secondary | ICD-10-CM

## 2019-07-12 DIAGNOSIS — E1165 Type 2 diabetes mellitus with hyperglycemia: Secondary | ICD-10-CM

## 2019-07-12 DIAGNOSIS — E119 Type 2 diabetes mellitus without complications: Secondary | ICD-10-CM

## 2019-07-12 DIAGNOSIS — Z794 Long term (current) use of insulin: Secondary | ICD-10-CM

## 2019-07-12 DIAGNOSIS — Z760 Encounter for issue of repeat prescription: Secondary | ICD-10-CM

## 2019-07-12 MED ORDER — METFORMIN HCL ER 500 MG PO TB24: 1000.0000 mg | ORAL_TABLET | Freq: Two times a day (BID) | ORAL | 3 refills | Status: DC

## 2019-07-12 MED ORDER — CHLORTHALIDONE 25 MG PO TABS: 25.0000 mg | ORAL_TABLET | Freq: Every day | ORAL | 3 refills | Status: DC

## 2019-07-12 MED ORDER — "INSULIN SYRINGE 27G X 1/2"" 0.5 ML MISC": 1.0000 | Freq: Every day | 3 refills | Status: DC

## 2019-07-12 MED ORDER — INSULIN NPH ISOPHANE & REGULAR (70-30) 100 UNIT/ML ~~LOC~~ SUSP: 20.0000 [IU] | Freq: Every day | SUBCUTANEOUS | 11 refills | Status: DC

## 2019-07-12 MED ORDER — PRAVASTATIN SODIUM 10 MG PO TABS: 10.0000 mg | ORAL_TABLET | Freq: Every day | ORAL | 3 refills | Status: DC

## 2019-07-12 MED ORDER — GLIPIZIDE 10 MG PO TABS: 10.0000 mg | ORAL_TABLET | Freq: Every day | ORAL | 3 refills | Status: DC

## 2019-07-12 MED ORDER — LOSARTAN POTASSIUM 100 MG PO TABS: 100.0000 mg | ORAL_TABLET | Freq: Every day | ORAL | 3 refills | Status: DC

## 2019-07-12 MED FILL — LOSARTAN POTASSIUM 100 MG T: 100 | 30 days supply | Qty: 30 | Fill #0

## 2019-07-12 MED FILL — PRAVASTATIN SODIUM 10 MG TA: 10 | 30 days supply | Qty: 30 | Fill #0

## 2019-07-12 MED FILL — ?CHLORTHALIDONE 25MG TABL: 25 | 30 days supply | Qty: 30 | Fill #0

## 2019-07-12 MED FILL — METFORMIN HCL ER 500 MG TB2: 500 | 30 days supply | Qty: 120 | Fill #0

## 2019-07-12 MED FILL — ?GLIPIZIDE 10 MG TABLET: 10 | 30 days supply | Qty: 30 | Fill #0

## 2019-07-12 MED FILL — ?HUMULIN 70/30 VIAL: (70-30) 100 | 10 days supply | Qty: 10 | Fill #0

## 2019-07-12 NOTE — ED Triage Notes (Signed)
Pt states he ran out of his b/p meds.

## 2019-07-12 NOTE — ED Provider Notes (Signed)
[facility]    CSN: [id number] Arrival date & time: 07/12/19  0807      History   Chief Complaint Chief Complaint  Patient presents with  . Hypertension    HPI Rodney Schultz is a 58 y.o. male.   Is a 58 year old man who was released from prison little over a month ago and is here for follow-up of elevated blood pressure. We started him on two different medications: Chlorthalidone and losartan.  Patient was unable to get his prescriptions because they cost $138 so he is gone a month without medicine..     Past Medical History:  Diagnosis Date  . Diabetes mellitus without complication (HCC)   . GERD (gastroesophageal reflux disease)   . Hypertension     Patient Active Problem List   Diagnosis Date Noted  . Type 2 diabetes mellitus with hyperglycemia (HCC) 03/21/2019    Past Surgical History:  Procedure Laterality Date  . testical  Right        Home Medications    Prior to Admission medications   Medication Sig Start Date End Date Taking? Authorizing Provider  chlorthalidone (HYGROTON) 25 MG tablet Take 1 tablet (25 mg total) by mouth daily. 07/12/19   Elvina Sidle, MD  glipiZIDE (GLUCOTROL) 10 MG tablet Take 1 tablet (10 mg total) by mouth daily before breakfast. 07/12/19   Elvina Sidle, MD  insulin NPH-regular Human (70-30) 100 UNIT/ML injection Inject 20 Units into the skin daily with breakfast. 07/12/19   Elvina Sidle, MD  Insulin Syringe 27G X 1/2" 0.5 ML MISC 1 Syringe by Does not apply route daily. 07/12/19   Elvina Sidle, MD  loratadine (CLARITIN) 10 MG tablet Take 10 mg by mouth daily as needed for allergies.    [provider]  losartan (COZAAR) 100 MG tablet Take 1 tablet (100 mg total) by mouth daily. 07/12/19   Elvina Sidle, MD  Menthol-Methyl Salicylate (MUSCLE RUB EX) Apply 1 application topically 2 (two) times daily as needed (leg pain).    [provider]  metFORMIN (GLUCOPHAGE-XR) 500 MG 24 hr  tablet Take 2 tablets (1,000 mg total) by mouth 2 (two) times daily. 07/12/19   Elvina Sidle, MD  pravastatin (PRAVACHOL) 10 MG tablet Take 1 tablet (10 mg total) by mouth daily. Patient unsure of dose at this time 07/12/19   Elvina Sidle, MD  amLODipine (NORVASC) 10 MG tablet Take 1 tablet (10 mg total) by mouth daily. 04/12/19 06/13/19  Elvina Sidle, MD  omeprazole (PRILOSEC) 20 MG capsule Take 20 mg by mouth daily.  04/12/19  [provider]  pioglitazone (ACTOS) 15 MG tablet Take 15 mg by mouth daily.  04/12/19  [provider]  triamterene-hydrochlorothiazide (MAXZIDE-25) 37.5-25 MG tablet Take 1 tablet by mouth daily. 04/28/19 06/13/19  Elvina Sidle, MD    Family History Family History  Problem Relation Age of Onset  . Healthy Mother     Social History Social History   Tobacco Use  . Smoking status: Light Tobacco Smoker    Packs/day: 0.10  . Smokeless tobacco: Never Used  . Tobacco comment: Last smoked on 04/03/19. Pt reports that he only smoke when he is in pain  Substance Use Topics  . Alcohol use: Not Currently  . Drug use: Not Currently     Allergies   Patient has no known allergies.   Review of Systems Review of Systems  Constitutional: Negative.   Respiratory: Negative.   Cardiovascular: Negative.   Neurological: Negative.  Physical Exam Triage Vital Signs ED Triage Vitals  Enc Vitals Group     BP 07/12/19 0844 (!) 162/107     Pulse Rate 07/12/19 0844 100     Resp 07/12/19 0844 (!) 158     Temp 07/12/19 0844 98.5 F (36.9 C)     Temp Source 07/12/19 0844 Oral     SpO2 07/12/19 0844 100 %     Weight 07/12/19 0845 190 lb (86.2 kg)     Height --      Head Circumference --      Peak Flow --      Pain Score 07/12/19 0845 0     Pain Loc --      Pain Edu? --      Excl. in [place]? --    No data found.  Updated Vital Signs BP (!) 162/107 (BP Location: Right Arm)   Pulse 100   Temp 98.5 F (36.9 C) (Oral)   Resp (!)  158   Wt 86.2 kg   SpO2 100%   BMI 27.26 kg/m    Physical Exam Vitals and nursing note reviewed.  Constitutional:      Appearance: Normal appearance. He is normal weight.  Eyes:     Conjunctiva/sclera: Conjunctivae normal.  Pulmonary:     Effort: Pulmonary effort is normal.  Musculoskeletal:        General: Normal range of motion.     Cervical back: Normal range of motion and neck supple.  Skin:    General: Skin is warm and dry.  Neurological:     General: No focal deficit present.     Mental Status: He is alert.      UC Treatments / Results  Labs (all labs ordered are listed, but only abnormal results are displayed) Labs Reviewed - No data to display  EKG   Radiology No results found.  Procedures Procedures (including critical care time)  Medications Ordered in UC Medications - No data to display  Initial Impression / Assessment and Plan / UC Course  I have reviewed the triage vital signs and the nursing notes.  Pertinent labs & imaging results that were available during my care of the patient were reviewed by me and considered in my medical decision making (see chart for details).    Final Clinical Impressions(s) / UC Diagnoses   Final diagnoses:  Essential hypertension  Insulin dependent type 2 diabetes mellitus [facility])   Discharge Instructions   None    ED Prescriptions    Medication Sig Dispense Auth. Provider   chlorthalidone (HYGROTON) 25 MG tablet Take 1 tablet (25 mg total) by mouth daily. 90 tablet Robyn Haber, MD   glipiZIDE (GLUCOTROL) 10 MG tablet Take 1 tablet (10 mg total) by mouth daily before breakfast. 90 tablet Robyn Haber, MD   insulin NPH-regular Human (70-30) 100 UNIT/ML injection Inject 20 Units into the skin daily with breakfast. 10 mL Robyn Haber, MD   Insulin Syringe 27G X 1/2" 0.5 ML MISC 1 Syringe by Does not apply route daily. 100 each Robyn Haber, MD   losartan (COZAAR) 100 MG tablet Take 1 tablet (100  mg total) by mouth daily. 90 tablet Robyn Haber, MD   metFORMIN (GLUCOPHAGE-XR) 500 MG 24 hr tablet Take 2 tablets (1,000 mg total) by mouth 2 (two) times daily. 180 tablet Robyn Haber, MD   pravastatin (PRAVACHOL) 10 MG tablet Take 1 tablet (10 mg total) by mouth daily. Patient unsure of dose at this time 90 tablet  Elvina Sidle, MD     PDMP not reviewed this encounter.   Elvina Sidle, MD 07/12/19 [phone]

## 2019-08-11 MED FILL — LOSARTAN POTASSIUM 100 MG T: 100 | 30 days supply | Qty: 30 | Fill #1

## 2019-08-11 MED FILL — ?GLIPIZIDE 10 MG TABLET: 10 | 30 days supply | Qty: 30 | Fill #1

## 2019-08-11 MED FILL — metFORMIN HCL ER 500 MG TB2: 500 | 30 days supply | Qty: 120 | Fill #1

## 2019-09-22 MED FILL — metFORMIN HCL ER 500 MG TB2: 500 | 30 days supply | Qty: 120 | Fill #2

## 2019-09-22 MED FILL — LOSARTAN POTASSIUM 100 MG T: 100 | 30 days supply | Qty: 30 | Fill #2

## 2019-09-22 MED FILL — ?GLIPIZIDE 10 MG TABLET: 10 | 30 days supply | Qty: 30 | Fill #2

## 2019-10-26 ENCOUNTER — Ambulatory Visit: Payer: Self-pay | Attending: Internal Medicine

## 2019-10-26 DIAGNOSIS — Z23 Encounter for immunization: Secondary | ICD-10-CM

## 2019-10-26 NOTE — Progress Notes (Signed)
   Covid-19 Vaccination Clinic  Name:  ELMA LIMAS    MRN: [id number] DOB: Mar 06, 1961  10/26/2019  Mr. [place] was observed post Covid-19 immunization for 15 minutes without incident. He was provided with Vaccine Information Sheet and instruction to access the V-Safe system.   Mr. Metayer was instructed to call 911 with any severe reactions post vaccine: Marland Kitchen Difficulty breathing  . Swelling of face and throat  . A fast heartbeat  . A bad rash all over body  . Dizziness and weakness   Immunizations Administered    Name Date Dose VIS Date Route   Pfizer COVID-19 Vaccine 10/26/2019 10:15 AM 0.3 mL 06/30/2019 Intramuscular   Manufacturer: [employer], [employer]   Lot: [id number]   NDC: [address]-5

## 2019-11-07 MED FILL — ?GLIPIZIDE 10 MG TABLET: 10 | 30 days supply | Qty: 30 | Fill #3

## 2019-11-07 MED FILL — LOSARTAN POTASSIUM 100 MG T: 100 | 30 days supply | Qty: 30 | Fill #3

## 2019-11-07 MED FILL — metFORMIN HCL ER 500 MG TB2: 500 | 30 days supply | Qty: 120 | Fill #3

## 2019-11-20 ENCOUNTER — Ambulatory Visit: Payer: Self-pay | Attending: Internal Medicine

## 2019-11-20 DIAGNOSIS — Z23 Encounter for immunization: Secondary | ICD-10-CM

## 2019-11-20 NOTE — Progress Notes (Signed)
   Covid-19 Vaccination Clinic  Name:  JUVENCIO VERDI    MRN: [id number] DOB: 10/05/1960  11/20/2019  Mr. Iovine was observed post Covid-19 immunization for 15 minutes without incident. He was provided with Vaccine Information Sheet and instruction to access the V-Safe system.   Mr. Ogborn was instructed to call 911 with any severe reactions post vaccine: Marland Kitchen Difficulty breathing  . Swelling of face and throat  . A fast heartbeat  . A bad rash all over body  . Dizziness and weakness   Immunizations Administered    Name Date Dose VIS Date Route   Pfizer COVID-19 Vaccine 11/20/2019 10:04 AM 0.3 mL 09/13/2018 Intramuscular   Manufacturer: [employer], [employer]   Lot: [id number]   NDC: [address]-6

## 2020-01-10 MED FILL — ?GLIPIZIDE 10 MG TABLET: 10 | 30 days supply | Qty: 30 | Fill #4

## 2020-01-10 MED FILL — LOSARTAN POTASSIUM 100 MG T: 100 | 30 days supply | Qty: 30 | Fill #4

## 2020-04-12 MED FILL — LOSARTAN POTASSIUM 100 MG T: 100 | 30 days supply | Qty: 30 | Fill #5

## 2020-04-12 MED FILL — ?GLIPIZIDE 10 MG TABLET: 10 | 30 days supply | Qty: 30 | Fill #5

## 2020-04-19 MED FILL — ?GLIPIZIDE 10 MG TABLET: 10 | 30 days supply | Qty: 30 | Fill #5

## 2020-04-19 MED FILL — LOSARTAN POTASSIUM 100 MG T: 100 | 30 days supply | Qty: 30 | Fill #5

## 2020-05-31 ENCOUNTER — Ambulatory Visit (HOSPITAL_COMMUNITY): Admission: EM | Admit: 2020-05-31 | Discharge: 2020-05-31 | Discharge status: Against Medical Advice | Payer: Self-pay

## 2020-05-31 ENCOUNTER — Other Ambulatory Visit: Payer: Self-pay

## 2020-05-31 NOTE — ED Notes (Signed)
Patient was called for triage 2 times with no response.

## 2020-05-31 NOTE — ED Notes (Signed)
Pt called in lobby x 2 no answer.

## 2020-06-19 MED FILL — LOSARTAN POTASSIUM 100 MG T: 100 | 30 days supply | Qty: 30 | Fill #6

## 2020-06-19 MED FILL — ?GLIPIZIDE 10 MG TABLET: 10 | 30 days supply | Qty: 30 | Fill #6

## 2020-06-19 MED FILL — metFORMIN HCL ER 500 MG TB2: 500 | 30 days supply | Qty: 120 | Fill #4

## 2020-07-11 IMAGING — US ULTRASOUND SCROTUM DOPPLER COMPLETE
1 series · 14 of 25 positions shown · non-contrast
Comparison: None.

CLINICAL DATA: Swelling and pain

EXAM:
SCROTAL ULTRASOUND
DOPPLER ULTRASOUND OF THE TESTICLES
TECHNIQUE: Complete ultrasound examination of the testicles, epididymis, and
other scrotal structures was performed. Color and spectral Doppler
ultrasound were also utilized to evaluate blood flow to the
testicles.

[Series 1: ultrasound scrotum doppler complete · 43 acquisitions, 14 frames shown]
[im 1/43]
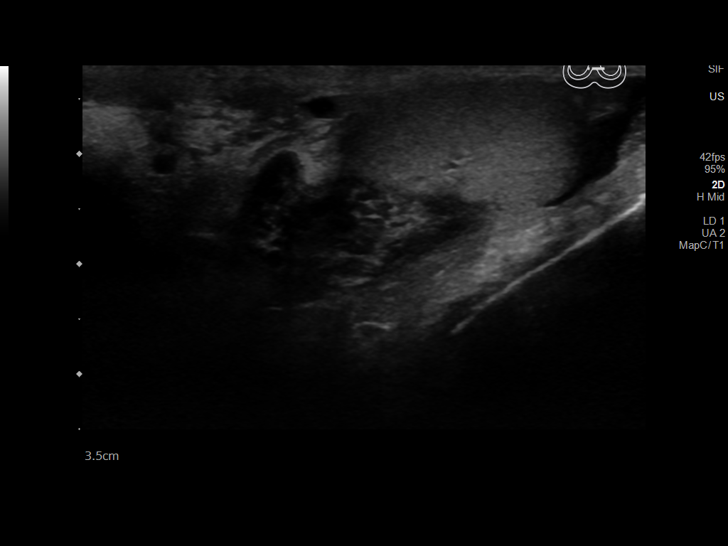
[im 4/43]
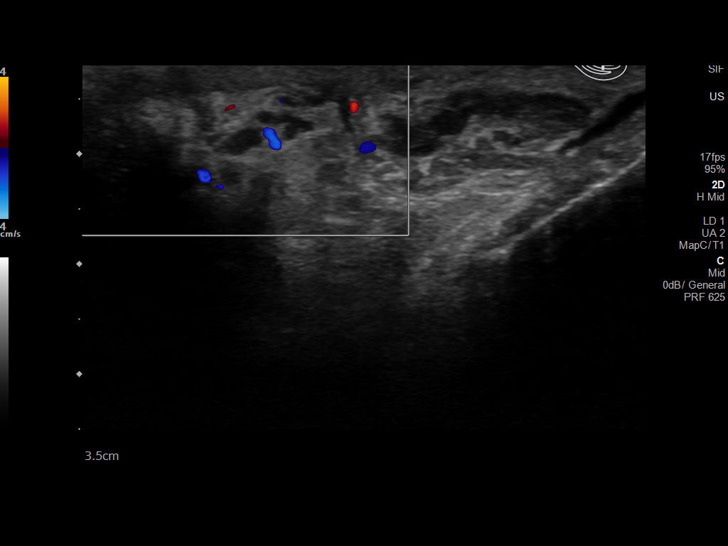
[im 8/43]
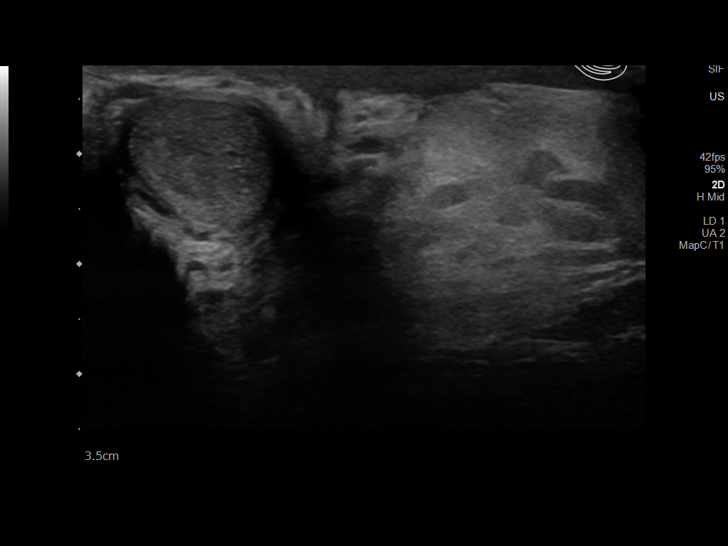
[im 11/43]
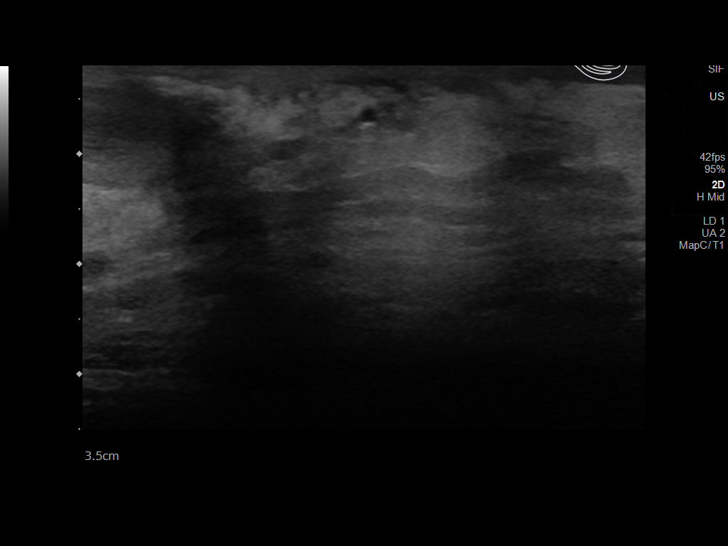
[im 15/43]
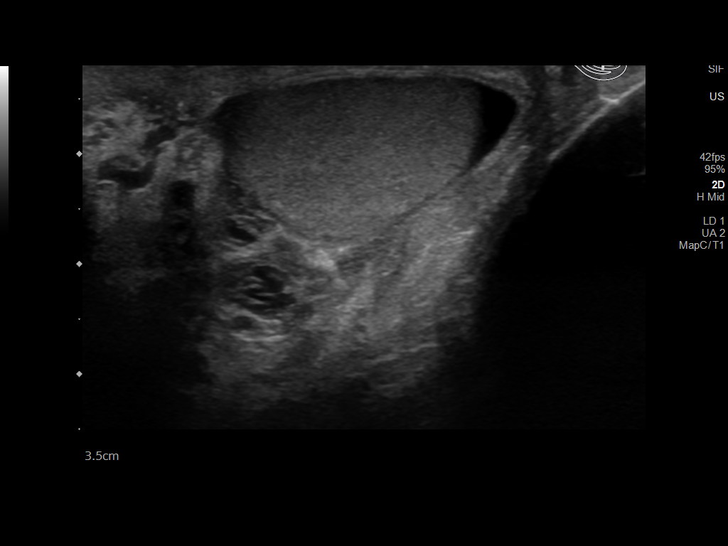
[im 16/43]
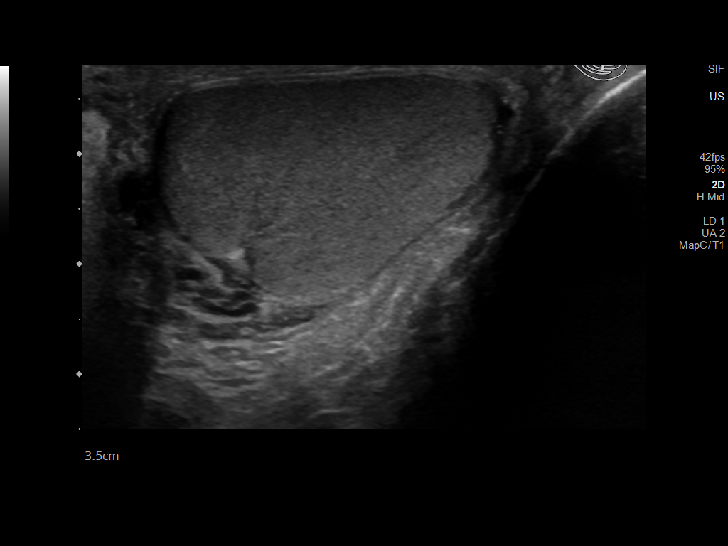
[im 20/43]
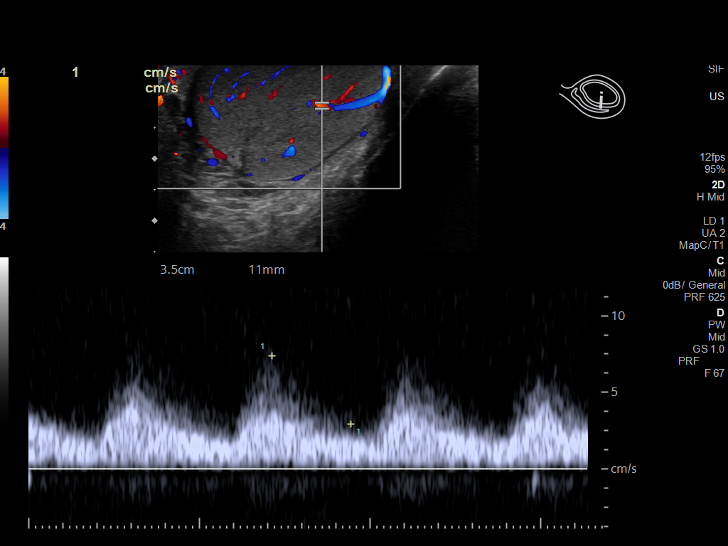
[im 23/43]
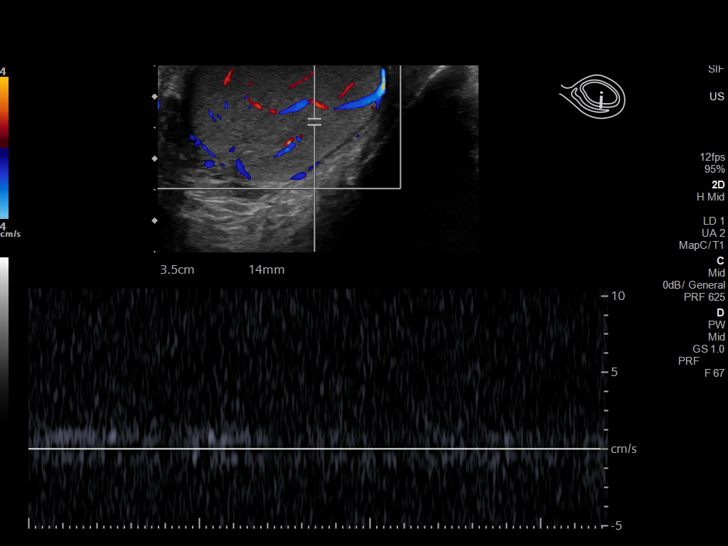
[im 27/43]
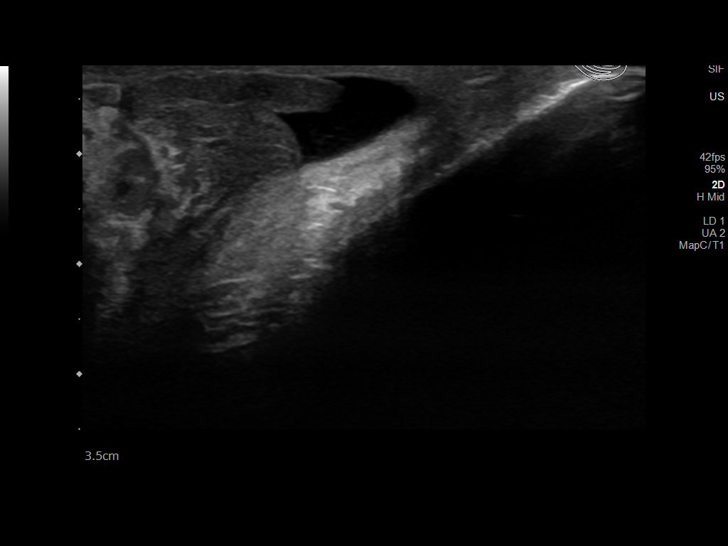
[im 29/43]
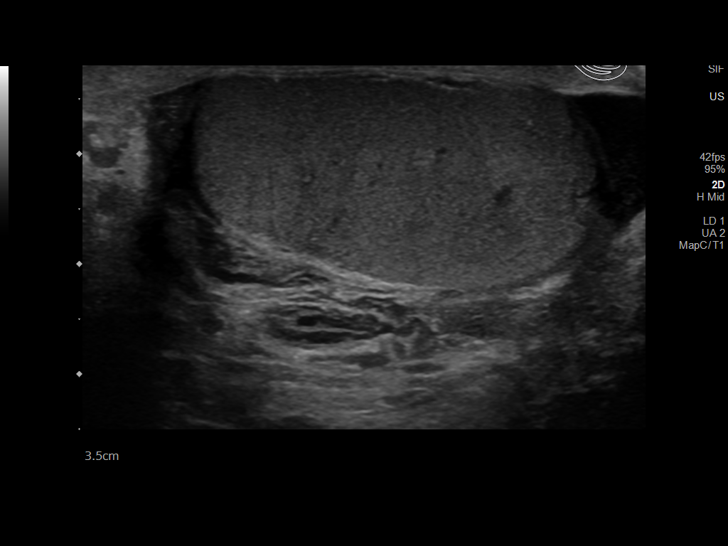
[im 32/43]
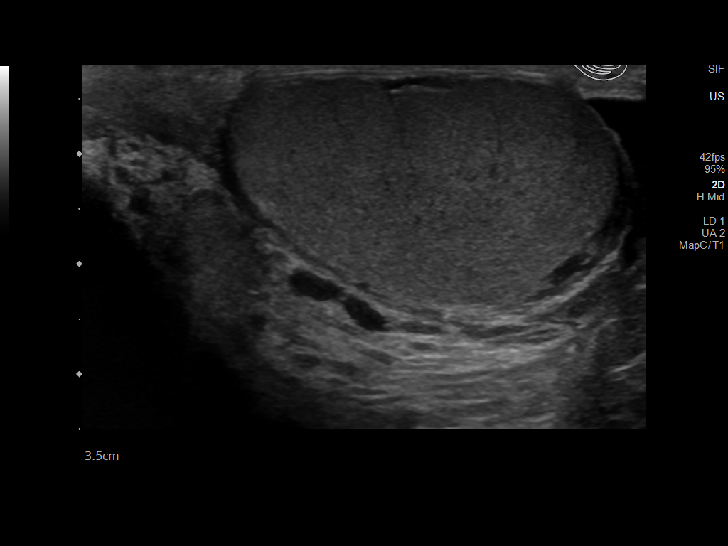
[im 36/43]
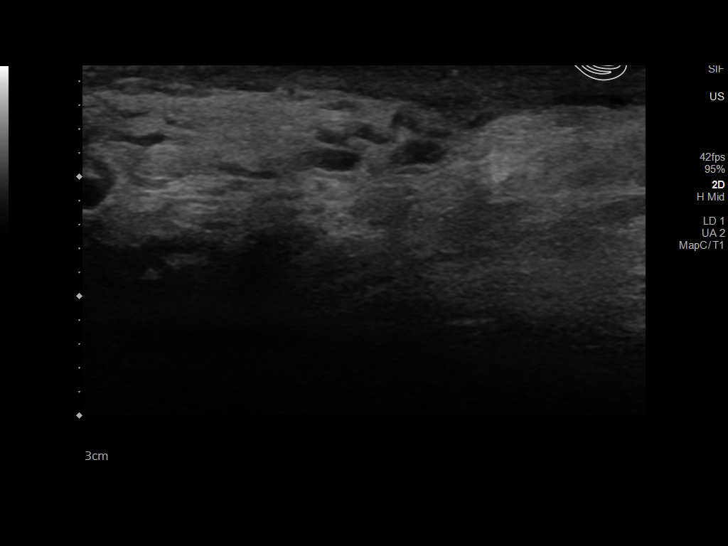
[im 39/43]
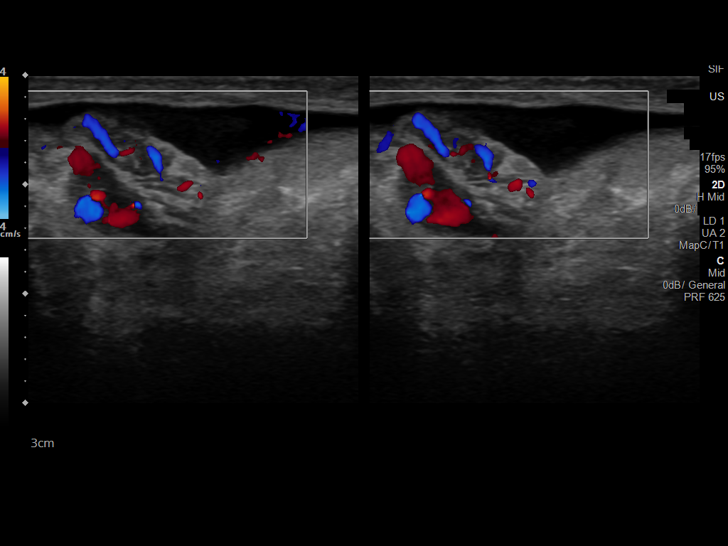
[im 43/43]
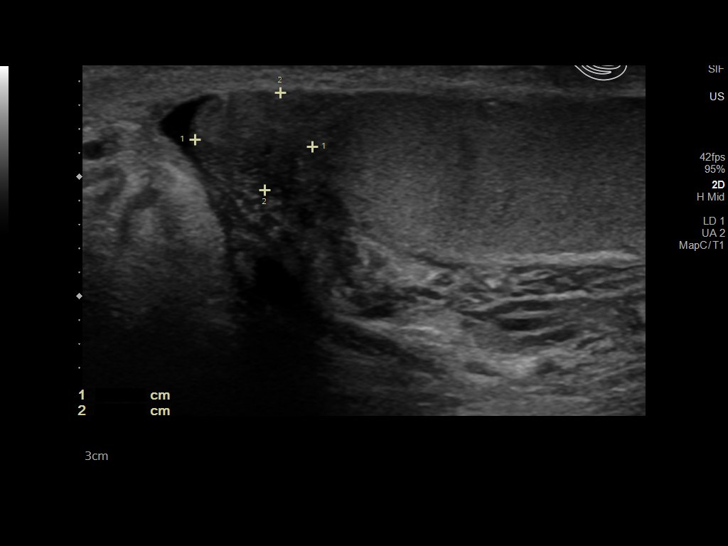

[14 of 25 positions shown; findings below may reference images not displayed]

FINDINGS: Right testicle

Measurements: 3.9 x 1.2 x 2.7 cm. Right testicle is positioned high
within the right groin/inguinal canal. No mass or microlithiasis.

Left testicle

Measurements: 4.3 x 2.1 x 3.0 cm. No mass or microlithiasis
visualized.

Right epididymis:  Not visualized.

Left epididymis:  Normal in size and appearance.

Hydrocele:  Small left hydrocele.

Varicocele:  None visualized.

Pulsed Doppler interrogation of both testes demonstrates normal low
resistance arterial and venous waveforms bilaterally.
IMPRESSION: No evidence of testicular mass or torsion.

Right testicle appears positioned within the right inguinal canal.
Correlate with patient history.

Nonvisualization of the right epididymis.

## 2020-07-16 ENCOUNTER — Ambulatory Visit: Payer: Self-pay | Attending: Internal Medicine

## 2020-07-16 DIAGNOSIS — Z23 Encounter for immunization: Secondary | ICD-10-CM

## 2020-07-16 NOTE — Progress Notes (Signed)
° °  Covid-19 Vaccination Clinic  Name:  NAVY BELAY    MRN: [id number] DOB: 1960/09/21  07/16/2020  Mr. Tremblay was observed post Covid-19 immunization for 15 minutes without incident. He was provided with Vaccine Information Sheet and instruction to access the V-Safe system.   Mr. Vi was instructed to call 911 with any severe reactions post vaccine:  Difficulty breathing   Swelling of face and throat   A fast heartbeat   A bad rash all over body   Dizziness and weakness   Immunizations Administered    Name Date Dose VIS Date Route   Pfizer COVID-19 Vaccine 07/16/2020  1:45 PM 0.3 mL 05/08/2020 Intramuscular   Manufacturer: [employer], [employer]   Lot: [id number]   NDC: [address]-2

## 2020-09-02 ENCOUNTER — Ambulatory Visit: Payer: Self-pay | Admitting: Internal Medicine

## 2020-09-13 ENCOUNTER — Other Ambulatory Visit: Payer: Self-pay

## 2020-09-13 ENCOUNTER — Encounter (HOSPITAL_COMMUNITY): Payer: Self-pay

## 2020-09-13 ENCOUNTER — Other Ambulatory Visit: Payer: Self-pay | Admitting: Emergency Medicine

## 2020-09-13 ENCOUNTER — Ambulatory Visit (HOSPITAL_COMMUNITY)
Admission: EM | Admit: 2020-09-13 | Discharge: 2020-09-13 | Disposition: A | Discharge status: Home / Self Care | Payer: 59 | Attending: Emergency Medicine | Admitting: Emergency Medicine

## 2020-09-13 DIAGNOSIS — E119 Type 2 diabetes mellitus without complications: Secondary | ICD-10-CM | POA: Diagnosis not present

## 2020-09-13 DIAGNOSIS — I10 Essential (primary) hypertension: Secondary | ICD-10-CM | POA: Diagnosis not present

## 2020-09-13 DIAGNOSIS — E1165 Type 2 diabetes mellitus with hyperglycemia: Secondary | ICD-10-CM

## 2020-09-13 DIAGNOSIS — Z760 Encounter for issue of repeat prescription: Secondary | ICD-10-CM

## 2020-09-13 LAB — CBG MONITORING, ED: Glucose-Capillary: 78 mg/dL (ref 70–99)

## 2020-09-13 MED ORDER — LOSARTAN POTASSIUM 100 MG PO TABS: 100.0000 mg | ORAL_TABLET | Freq: Every day | ORAL | 1 refills | Status: DC

## 2020-09-13 MED ORDER — CHLORTHALIDONE 25 MG PO TABS: 25.0000 mg | ORAL_TABLET | Freq: Every day | ORAL | 1 refills | Status: DC

## 2020-09-13 MED ORDER — GLIPIZIDE 10 MG PO TABS: 10.0000 mg | ORAL_TABLET | Freq: Every day | ORAL | 1 refills | Status: DC

## 2020-09-13 MED ORDER — METFORMIN HCL ER 500 MG PO TB24: 1000.0000 mg | ORAL_TABLET | Freq: Two times a day (BID) | ORAL | 1 refills | Status: DC

## 2020-09-13 MED FILL — CHLORTHALIDONE 25 MG TAB: 25 | 30 days supply | Qty: 30 | Fill #0

## 2020-09-13 MED FILL — glipiZIDE 10 MG TABS: 10 | 30 days supply | Qty: 30 | Fill #0

## 2020-09-13 MED FILL — metFORMIN HCL ER 500 MG TB2: 500 | 30 days supply | Qty: 120 | Fill #0

## 2020-09-13 MED FILL — LOSARTAN POTASSIUM 100 MG T: 100 | 30 days supply | Qty: 30 | Fill #0

## 2020-09-13 NOTE — ED Triage Notes (Signed)
Pt in requesting medication refill for metformin, glipizide, and losartan  States that he has been out for about 2 weeks now

## 2020-09-13 NOTE — ED Provider Notes (Signed)
HPI  SUBJECTIVE:  Rodney Schultz is a 60 y.o. male who presents for medication refill on his lisinopril, Metformin and glipizide.  States that he has been out of all of these medications for 2 weeks.  He states his blood pressure has been around 190/110 during this time, but denies headache, blurry vision, slurred speech, arm or leg weakness, facial droop, discoordination, chest pain, shortness of breath, palpitations, pain tearing through to his back, syncope, seizures, lower extremity edema, anuria, hematuria.  He states that chlorthaladone and losartan controls his blood pressure well.  He states that his glucose has been running within normal limits-states it was 110 earlier today.  Has a past medical history of diabetes, hypertension.  No history of stroke, MI, hypercholesterolemia, smoking.  PMD: None.    Past Medical History:  Diagnosis Date  . Diabetes mellitus without complication (HCC)   . GERD (gastroesophageal reflux disease)   . Hypertension     Past Surgical History:  Procedure Laterality Date  . testical  Right     Family History  Problem Relation Age of Onset  . Healthy Mother     Social History   Tobacco Use  . Smoking status: Light Tobacco Smoker    Packs/day: 0.10  . Smokeless tobacco: Never Used  . Tobacco comment: Last smoked on 04/03/19. Pt reports that he only smoke when he is in pain  Vaping Use  . Vaping Use: Never used  Substance Use Topics  . Alcohol use: Not Currently  . Drug use: Not Currently    No current facility-administered medications for this encounter.  Current Outpatient Medications:  .  chlorthalidone (HYGROTON) 25 MG tablet, Take 1 tablet (25 mg total) by mouth daily., Disp: 30 tablet, Rfl: 1 .  glipiZIDE (GLUCOTROL) 10 MG tablet, Take 1 tablet (10 mg total) by mouth daily before breakfast., Disp: 30 tablet, Rfl: 1 .  insulin NPH-regular Human (70-30) 100 UNIT/ML injection, Inject 20 Units into the skin daily with breakfast., Disp:  10 mL, Rfl: 11 .  Insulin Syringe 27G X 1/2" 0.5 ML MISC, 1 Syringe by Does not apply route daily., Disp: 100 each, Rfl: 3 .  losartan (COZAAR) 100 MG tablet, Take 1 tablet (100 mg total) by mouth daily., Disp: 30 tablet, Rfl: 1 .  Menthol-Methyl Salicylate (MUSCLE RUB EX), Apply 1 application topically 2 (two) times daily as needed (leg pain)., Disp: , Rfl:  .  metFORMIN (GLUCOPHAGE-XR) 500 MG 24 hr tablet, Take 2 tablets (1,000 mg total) by mouth 2 (two) times daily., Disp: 120 tablet, Rfl: 1  No Known Allergies   ROS  As noted in HPI.   Physical Exam  BP (!) 203/119   Pulse 81   Temp 98.4 F (36.9 C)   Resp 17   SpO2 100%   Constitutional: Well developed, well nourished, no acute distress Eyes:  EOMI, conjunctiva normal bilaterally HENT: Normocephalic, atraumatic,mucus membranes moist Respiratory: Normal inspiratory effort Cardiovascular: Normal rate GI: nondistended skin: No rash, skin intact Musculoskeletal: no deformities Neurologic: Alert & oriented x 3, no focal neuro deficits Psychiatric: Speech and behavior appropriate   ED Course   Medications - No data to display  Orders Placed This Encounter  Procedures  . POC CBG monitoring    Standing Status:   Standing    Number of Occurrences:   1    Results for orders placed or performed during the hospital encounter of 09/13/20 (from the past 24 hour(s))  POC CBG monitoring  Status: None   Collection Time: 09/13/20  2:50 PM  Result Value Ref Range   Glucose-Capillary 78 70 - 99 mg/dL   No results found.  ED Clinical Impression  1.  Essential hypertension 2.  Type 2 diabetes without complication without long-term current use of insulin 3.  Medication refill  ED Assessment/Plan  Confirmed all medications and dosages with the patient.  1-hypertension.  Blood pressure noted.  States BP has been running in this range recently.  Has not taken BP meds in 2-week.  Pt has no historical evidence of end organ  damage. Pt denies any CNS type sx such as HA, visual changes, focal paresis, or new onset seizure activity. Pt denies any CV sx such as CP, dyspnea, palpitations, pedal edema, tearing pain radiating to back or abd. Pt denied any renal sx such as anuria or hematuria.  Will refill the losartan and Hygroton-1 month with one refill. Pt to f/u as OP.   2.  Diabetes.  Glucose 78.  Patient states he is not taking insulin.  Will refill Metformin and glipizide-1 month with 1 refill.  His glucose is normal today despite not having medications for 2 weeks.   will have him hold off on both of these medications for now and start them only if his sugars get above 200.  He is to start the Metformin only if glucose is consistently above 200.  Explained this to patient and wrote it down. will have PMD decide whether he needs to restart glipizide.  Providing primary care list for ongoing care and will order assistance in finding primary care provider.  Discussed labs,  MDM, treatment plan, and plan for follow-up with patient. Discussed sn/sx that should prompt return to the ED. patient agrees with plan.   Meds ordered this encounter  Medications  . chlorthalidone (HYGROTON) 25 MG tablet    Sig: Take 1 tablet (25 mg total) by mouth daily.    Dispense:  30 tablet    Refill:  1  . glipiZIDE (GLUCOTROL) 10 MG tablet    Sig: Take 1 tablet (10 mg total) by mouth daily before breakfast.    Dispense:  30 tablet    Refill:  1  . losartan (COZAAR) 100 MG tablet    Sig: Take 1 tablet (100 mg total) by mouth daily.    Dispense:  30 tablet    Refill:  1  . metFORMIN (GLUCOPHAGE-XR) 500 MG 24 hr tablet    Sig: Take 2 tablets (1,000 mg total) by mouth 2 (two) times daily.    Dispense:  120 tablet    Refill:  1    *This clinic note was created using Scientist, clinical (histocompatibility and immunogenetics). Therefore, there may be occasional mistakes despite careful proofreading.   ?    Domenick Gong, MD 09/14/20 [phone]

## 2020-09-13 NOTE — Discharge Instructions (Addendum)
Decrease your salt intake. diet and exercise will lower your blood pressure significantly. It is important to keep your blood pressure under good control, as having a elevated blood pressure for prolonged periods of time significantly increases your risk of stroke, heart attacks, kidney damage, eye damage, and other problems. Measure your blood pressure once a day, preferably at the same time every day. Keep a log of this and bring it to your next doctor's appointment.  Bring your blood pressure cuff as well.  Return here in 2 weeks for blood pressure recheck if you're unable to find a primary care physician by then. Return immediately to the ER if you start having chest pain, headache, problems seeing, problems talking, problems walking, if you feel like you're about to pass out, if you do pass out, if you have a seizure, or for any other concerns.  Also keep a log of your glucose.  Bring that to your doctor's appointment.  Your sugar is normal today despite not being on any medicines for 2 weeks.  I would not start taking the glipizide or the Metformin at this point in time.  I would continue monitoring your blood sugars and keeping a log of it.  I would start with the Metformin if your glucose gets above 200 mg consistently.  Do not start the glipizide unless your glucose is persistently elevated despite taking the Metformin.  Hopefully by that time you will have a primary care provider to monitor this more closely.  Below is a list of primary care practices who are taking new patients for you to follow-up with.  [facility] internal medicine clinic Ground Floor - [facility], [address] [place], [place], [place] [address] [phone]  [facility] at [facility] [address] [place], [place] [address] [phone]  [facility] [address] [place], [place] [address] [phone]  [facility] [phone] [address] [place] [address]  [facility]: [address] [place] [address]  [phone]  [facility]: [address] [place] [address]   [phone]  [facility]: [address] [place] [address]  [phone]  [facility] : [address] [place] [address] [phone]  [facility]: [address] [place] [address] [phone]  [facility]: [address] [place] [address] [phone]  Dr. Oneal Grout [address] [facility] [place] [address]  [phone]   Go to www.goodrx.com to look up your medications. This will give you a list of where you can find your prescriptions at the most affordable prices. Or ask the pharmacist what the cash price is, or if they have any other discount programs available to help make your medication more affordable. This can be less expensive than what you would pay with insurance.

## 2020-10-22 ENCOUNTER — Other Ambulatory Visit: Payer: Self-pay

## 2020-10-22 ENCOUNTER — Ambulatory Visit (INDEPENDENT_AMBULATORY_CARE_PROVIDER_SITE_OTHER): Payer: 59 | Admitting: Medical

## 2020-10-22 ENCOUNTER — Encounter: Payer: Self-pay | Admitting: Medical

## 2020-10-22 VITALS — BP 158/110 | HR 95 | Ht 70.0 in | Wt 180.4 lb

## 2020-10-22 DIAGNOSIS — E1169 Type 2 diabetes mellitus with other specified complication: Secondary | ICD-10-CM

## 2020-10-22 DIAGNOSIS — R319 Hematuria, unspecified: Secondary | ICD-10-CM | POA: Diagnosis not present

## 2020-10-22 DIAGNOSIS — Z9079 Acquired absence of other genital organ(s): Secondary | ICD-10-CM | POA: Diagnosis not present

## 2020-10-22 DIAGNOSIS — I10 Essential (primary) hypertension: Secondary | ICD-10-CM | POA: Diagnosis not present

## 2020-10-22 DIAGNOSIS — E119 Type 2 diabetes mellitus without complications: Secondary | ICD-10-CM | POA: Insufficient documentation

## 2020-10-22 DIAGNOSIS — R9431 Abnormal electrocardiogram [ECG] [EKG]: Secondary | ICD-10-CM | POA: Insufficient documentation

## 2020-10-22 LAB — POCT URINALYSIS DIP (PROADVANTAGE DEVICE)
Bilirubin, UA: NEGATIVE
Glucose, UA: NEGATIVE mg/dL
Ketones, POC UA: NEGATIVE mg/dL
Leukocytes, UA: NEGATIVE
Nitrite, UA: NEGATIVE
Protein Ur, POC: >=300 mg/dL — AB
Specific Gravity, Urine: 1.03
Urobilinogen, Ur: 0.2
pH, UA: 6 (ref 5.0–8.0)

## 2020-10-22 LAB — HEMOGLOBIN A1C
Est. average glucose Bld gHb Est-mCnc: 140 mg/dL
Hgb A1c MFr Bld: 6.5 % — ABNORMAL HIGH (ref 4.8–5.6)

## 2020-10-22 NOTE — Progress Notes (Addendum)
Subjective:  Rodney Schultz is a 60 y.o. male who presents for Chief Complaint  Patient presents with  . New Patient (Initial Visit)    Establish care and discuss blood pressure which has been elevated     Here as a new patient today to establish care.   Was going to urgent care for primary care prior.  HTN - is on medication but home readings tend to start up close to over 100 DBP.  Ranges between 90-110 DBP.   Typical home BP 150/99.   First diagnosed with HTN 6 years ago.   Saw cardiology in prison in the past, maybe 02/2019 had stress test, echo.  Currently on Losartan 100mg  and Chlorthalidone 25mg  daily.   In prison was on HCTZ, was eventually put on Chlorthalidone.  No chest pain, palpitations, SOB, edema.      Is diabetic, compliant with diabetes medication, Metformin and Glipizide.   Last on insulin 15 years ago.  Last labs 09/13/2020, HgbA1C 6%, sugar around 78.    Not currently on cholesterol medication but not currently.   Quit smoking 2 months ago, no alcohol or drugs in 22 years.     Works for .    Exercise - walks, some basketball.   Eating habits, not a lot of food.  Eats a variety of food including fried chicken.    He reports history of surgery while in prison in the past.  He notes that he went in for boils on his buttocks but then woke up by the surgery missing the right testicle.  He would like to have a prosthetic testicle put back in.  He is not sure why his testicles removed.  He felt like he had an unnecessary procedure.  No other aggravating or relieving factors.    No other c/o.   Past Medical History:  Diagnosis Date  . Diabetes mellitus without complication (HCC)   . GERD (gastroesophageal reflux disease)   . Hypertension    Current Outpatient Medications on File Prior to Visit  Medication Sig Dispense Refill  . chlorthalidone (HYGROTON) 25 MG tablet Take 1 tablet (25 mg total) by mouth daily. 30 tablet 1  . glipiZIDE (GLUCOTROL) 10 MG  tablet Take 1 tablet (10 mg total) by mouth daily before breakfast. 30 tablet 1  . losartan (COZAAR) 100 MG tablet Take 1 tablet (100 mg total) by mouth daily. 30 tablet 1  . Menthol-Methyl Salicylate (MUSCLE RUB EX) Apply 1 application topically 2 (two) times daily as needed (leg pain).    . metFORMIN (GLUCOPHAGE-XR) 500 MG 24 hr tablet Take 2 tablets (1,000 mg total) by mouth 2 (two) times daily. 120 tablet 1  . [DISCONTINUED] amLODipine (NORVASC) 10 MG tablet Take 1 tablet (10 mg total) by mouth daily. 90 tablet 3  . [DISCONTINUED] loratadine (CLARITIN) 10 MG tablet Take 10 mg by mouth daily as needed for allergies.    . [DISCONTINUED] omeprazole (PRILOSEC) 20 MG capsule Take 20 mg by mouth daily.    . [DISCONTINUED] pioglitazone (ACTOS) 15 MG tablet Take 15 mg by mouth daily.    . [DISCONTINUED] pravastatin (PRAVACHOL) 10 MG tablet Take 1 tablet (10 mg total) by mouth daily. Patient unsure of dose at this time 90 tablet 3  . [DISCONTINUED] triamterene-hydrochlorothiazide (MAXZIDE-25) 37.5-25 MG tablet Take 1 tablet by mouth daily. 90 tablet 3   No current facility-administered medications on file prior to visit.     The following portions of the patient's history were  reviewed and updated as appropriate: allergies, current medications, past family history, past medical history, past social history, past surgical history and problem list.  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  . Essential hypertension, benign Yes  . Type 2 diabetes mellitus with other specified complication, without long-term current use of insulin (HCC)   . Hematuria, unspecified type   . S/P orchiectomy   . Abnormal EKG      Plan: Hypertension-not at goal, EKG reviewed, labs today, will make medication modifications pending labs  Diabetes-labs today, follow-up pending results.  For now continue current medication  History of hematuria, status post orchiectomy-we will try to request prior records.  He saw urologist in [place] after getting out of prison and supposedly had normal work-up.  We will try to get prior records from [facility] [employer] to review  We discussed the need to be on statin the standard of care  Abnormal EKG - we will try to get copy of prior cardiac evaluation  Johnte was seen today for new patient (initial visit).  Diagnoses and all orders for this visit:  Essential hypertension, benign -     EKG 12-Lead -     POCT Urinalysis DIP (Proadvantage Device) -     Basic metabolic panel  Type 2 diabetes mellitus with other specified complication, without long-term current use of insulin (HCC) -     Hemoglobin A1c  Hematuria, unspecified type -     Basic metabolic panel  S/P orchiectomy  Abnormal EKG    Follow up: Pending labs

## 2020-10-23 ENCOUNTER — Other Ambulatory Visit: Payer: Self-pay | Admitting: Medical

## 2020-10-23 ENCOUNTER — Other Ambulatory Visit (HOSPITAL_BASED_OUTPATIENT_CLINIC_OR_DEPARTMENT_OTHER): Payer: Self-pay

## 2020-10-23 ENCOUNTER — Encounter: Payer: Self-pay | Admitting: Medical

## 2020-10-23 DIAGNOSIS — I10 Essential (primary) hypertension: Secondary | ICD-10-CM

## 2020-10-23 DIAGNOSIS — R809 Proteinuria, unspecified: Secondary | ICD-10-CM

## 2020-10-23 LAB — BASIC METABOLIC PANEL
BUN/Creatinine Ratio: 12 (ref 10–24)
BUN: 18 mg/dL (ref 8–27)
CO2: 21 mmol/L (ref 20–29)
Calcium: 9.5 mg/dL (ref 8.6–10.2)
Chloride: 110 mmol/L — ABNORMAL HIGH (ref 96–106)
Creatinine, Ser: 1.53 mg/dL — ABNORMAL HIGH (ref 0.76–1.27)
Glucose: 109 mg/dL — ABNORMAL HIGH (ref 65–99)
Potassium: 4.9 mmol/L (ref 3.5–5.2)
Sodium: 145 mmol/L — ABNORMAL HIGH (ref 134–144)
eGFR: 51 mL/min/{1.73_m2} — ABNORMAL LOW (ref >59)

## 2020-10-23 MED ORDER — ATORVASTATIN CALCIUM 20 MG PO TABS: 20.0000 mg | ORAL_TABLET | Freq: Every day | ORAL | 3 refills | Status: DC

## 2020-10-23 MED ORDER — EDARBYCLOR 40-25 MG PO TABS
1.0000 | ORAL_TABLET | Freq: Every day | ORAL | 3 refills | Status: DC
  Filled 2020-10-23 – 2020-10-25 (×2): qty 30, 30d supply, fill #0

## 2020-10-23 MED ORDER — AMLODIPINE BESYLATE 10 MG PO TABS
10.0000 mg | ORAL_TABLET | Freq: Every day | ORAL | 3 refills | Status: DC
  Filled 2020-10-23 – 2020-10-25 (×2): qty 30, 30d supply, fill #0

## 2020-10-24 ENCOUNTER — Other Ambulatory Visit (HOSPITAL_BASED_OUTPATIENT_CLINIC_OR_DEPARTMENT_OTHER): Payer: Self-pay

## 2020-10-25 ENCOUNTER — Other Ambulatory Visit: Payer: Self-pay

## 2020-10-28 ENCOUNTER — Other Ambulatory Visit (HOSPITAL_BASED_OUTPATIENT_CLINIC_OR_DEPARTMENT_OTHER): Payer: Self-pay

## 2020-10-28 ENCOUNTER — Other Ambulatory Visit: Payer: Self-pay

## 2020-11-04 ENCOUNTER — Other Ambulatory Visit: Payer: Self-pay

## 2020-11-05 ENCOUNTER — Other Ambulatory Visit: Payer: Self-pay

## 2020-12-10 ENCOUNTER — Telehealth: Payer: Self-pay | Admitting: Medical

## 2020-12-10 NOTE — Telephone Encounter (Signed)
Requested records received from [facility]

## 2021-01-08 ENCOUNTER — Ambulatory Visit: Payer: Self-pay | Admitting: Internal Medicine

## 2021-01-29 ENCOUNTER — Other Ambulatory Visit (HOSPITAL_BASED_OUTPATIENT_CLINIC_OR_DEPARTMENT_OTHER): Payer: Self-pay

## 2021-03-21 ENCOUNTER — Telehealth: Payer: Self-pay | Admitting: Internal Medicine

## 2021-03-21 NOTE — Telephone Encounter (Signed)
Called and left message for pt to call back to schedule an appt 

## 2021-03-27 ENCOUNTER — Ambulatory Visit (INDEPENDENT_AMBULATORY_CARE_PROVIDER_SITE_OTHER): Payer: 59 | Admitting: Medical

## 2021-03-27 ENCOUNTER — Other Ambulatory Visit: Payer: Self-pay

## 2021-03-27 ENCOUNTER — Other Ambulatory Visit (HOSPITAL_BASED_OUTPATIENT_CLINIC_OR_DEPARTMENT_OTHER): Payer: Self-pay

## 2021-03-27 ENCOUNTER — Telehealth: Payer: Self-pay

## 2021-03-27 ENCOUNTER — Encounter: Payer: Self-pay | Admitting: Medical

## 2021-03-27 VITALS — BP 170/110 | HR 87 | Wt 187.4 lb

## 2021-03-27 DIAGNOSIS — E785 Hyperlipidemia, unspecified: Secondary | ICD-10-CM

## 2021-03-27 DIAGNOSIS — I10 Essential (primary) hypertension: Secondary | ICD-10-CM | POA: Diagnosis not present

## 2021-03-27 DIAGNOSIS — N509 Disorder of male genital organs, unspecified: Secondary | ICD-10-CM | POA: Diagnosis not present

## 2021-03-27 DIAGNOSIS — R809 Proteinuria, unspecified: Secondary | ICD-10-CM | POA: Insufficient documentation

## 2021-03-27 DIAGNOSIS — R310 Gross hematuria: Secondary | ICD-10-CM

## 2021-03-27 DIAGNOSIS — E1169 Type 2 diabetes mellitus with other specified complication: Secondary | ICD-10-CM

## 2021-03-27 DIAGNOSIS — R319 Hematuria, unspecified: Secondary | ICD-10-CM

## 2021-03-27 LAB — POCT GLYCOSYLATED HEMOGLOBIN (HGB A1C): Hemoglobin A1C: 7 % — AB (ref 4.0–5.6)

## 2021-03-27 MED ORDER — GLIPIZIDE 10 MG PO TABS
10.0000 mg | ORAL_TABLET | Freq: Every day | ORAL | 1 refills | Status: DC
  Filled 2021-03-27: qty 30, 30d supply, fill #0
  Filled 2021-03-27: qty 90, 90d supply, fill #0
  Filled 2021-07-07 (×3): qty 30, 30d supply, fill #1
  Filled 2021-08-07: qty 30, 30d supply, fill #2
  Filled 2021-08-25 – 2021-09-10 (×2): qty 30, 30d supply, fill #3
  Filled 2021-12-04: qty 30, 30d supply, fill #4
  Filled 2022-02-27: qty 30, 30d supply, fill #5

## 2021-03-27 MED ORDER — AMLODIPINE BESYLATE 10 MG PO TABS
10.0000 mg | ORAL_TABLET | Freq: Every day | ORAL | 3 refills | Status: DC
  Filled 2021-03-27: qty 90, 90d supply, fill #0
  Filled 2021-03-27: qty 30, 30d supply, fill #0

## 2021-03-27 MED ORDER — ATORVASTATIN CALCIUM 20 MG PO TABS
20.0000 mg | ORAL_TABLET | Freq: Every day | ORAL | 3 refills | Status: DC
Start: 2021-03-27 — End: 2021-07-29
  Filled 2021-03-27: qty 90, 90d supply, fill #0
  Filled 2021-03-27: qty 30, 30d supply, fill #0

## 2021-03-27 MED ORDER — CHLORTHALIDONE 25 MG PO TABS
ORAL_TABLET | Freq: Every day | ORAL | 3 refills | Status: DC
  Filled 2021-03-27: qty 90, fill #0
  Filled 2021-03-27: qty 30, 30d supply, fill #0

## 2021-03-27 MED ORDER — VALSARTAN 320 MG PO TABS
320.0000 mg | ORAL_TABLET | Freq: Every day | ORAL | 0 refills | Status: DC
  Filled 2021-03-27 (×2): qty 30, 30d supply, fill #0

## 2021-03-27 MED ORDER — METFORMIN HCL ER 500 MG PO TB24
500.0000 mg | ORAL_TABLET | Freq: Two times a day (BID) | ORAL | 1 refills | Status: DC
  Filled 2021-03-27: qty 60, 30d supply, fill #0
  Filled 2021-03-27: qty 180, 90d supply, fill #0

## 2021-03-27 MED ORDER — ATENOLOL 25 MG PO TABS
25.0000 mg | ORAL_TABLET | Freq: Every day | ORAL | 0 refills | Status: DC
  Filled 2021-03-27 (×2): qty 30, 30d supply, fill #0

## 2021-03-27 NOTE — Patient Instructions (Signed)
High blood pressure Continue Amlodipine 10 mg daily in the morning for blood pressure Continue Chlorthalidone 25 mg daily in the morning for blood pressure STOP Losartan blood pressure medication ADD or begin Valsartan 320 mg daily in the morning for blood pressure ADD or begin Atenolol 25 mg daily in the morning for blood pressure Your blood pressure is way too high so it will likely take multiple medications to get this under control I am changing out the losartan for valsartan as I think this will work better than the current losartan you are taking   High cholesterol Begin a atorvastatin/Lipitor 20 mg daily at bedtime for cholesterol and to reduce risk for heart disease   Diabetes I reduced you Metformin to 1 tablet twice daily given your kidney function is abnormal Continue glipizide 10 mg daily Your diabetes marker is okay at the moment, stable Check your blood sugars fasting in the mornings.  The goal is less than 130 fasting glucose See your eye doctor yearly for eye exam and make sure they send a copy of your eye exam to [facility]   Testicular issue and blood in the urine I have made a referral to [facility] Expect a call back within the next 7 to 10 days about this appointment   Please call if any issues with obtaining medications, price issues, insurance not covering the medicines or any other issues that we do not delay treatment

## 2021-03-27 NOTE — Progress Notes (Signed)
Subjective:  Rodney Schultz is a 60 y.o. male who presents for Chief Complaint  Patient presents with   med check    Med check. Been out of meds for 2 weeks   Here for med check.  I saw him as a new patient back in April.  At that visit we did some labs but when we tried to call him back and make changes to his medications and describe the lab findings we could not get a hold of him by phone after multiple attempts.  We mailed him a letter about the recommendations and he does not recall getting the letter either.  Thus he did not make any the changes recommended at that time.  He is still taking the same medications he was taking back in April.  We also apparently had 2 different pharmacies listed and could not get a hold of him to verify the preferred pharmacy.  Thus, here for recheck as he is out of medications.  Ran out about a week and a half ago  Home blood pressure readings are elevated.  However his blood sugars run okay  He does note compliance with losartan, chlorthalidone and amlodipine for blood pressure.  He denies chest pain, palpitations, edema.  Diabetes-compliant with glipizide and metformin 2 tablets twice daily  He is not on a cholesterol medicine although we recommended at last visit  He still has concerns about his right testicle area.  We discussed this last visit.  He notes prior surgery when he was in prison.  He claims that he was operated on without his permission in a way that was not warranted or justified causing harm to his right scrotal region.  He says he took his testicle out for no reason.  He does get blood in the urine particularly when the area of his right inguinal region swells up which he does fairly regularly.  No fever.  No other body aches or chills.  Married, no concern for STD.   Past Medical History:  Diagnosis Date   Diabetes mellitus without complication (HCC)    GERD (gastroesophageal reflux disease)    Hypertension    Review of  systems as in subjective    Objective: BP (!) 170/110   Pulse 87   Wt 187 lb 6.4 oz (85 kg)   BMI 26.89 kg/m   General appearance: alert, no distress, well developed, well nourished, African-American male Neck: supple, no lymphadenopathy, no thyromegaly, no masses, no bruits Heart: RRR, normal S1, S2, no murmurs Lungs: CTA bilaterally, no wheezes, rhonchi, or rales Pulses: 2+ radial pulses, 2+ pedal pulses, normal cap refill Ext: no edema GU: Surgical scar in the right mons pubis region, fullness in the inguinal region, surgical scar noted on the right inguinal/scrotal region, otherwise normal genitalia, circumcised    Assessment: Encounter Diagnoses  Name Primary?   Essential hypertension, benign Yes   Type 2 diabetes mellitus with other specified complication, without long-term current use of insulin (HCC)    Hyperlipidemia, unspecified hyperlipidemia type    Testicular abnormality    Gross hematuria    Hematuria, unspecified type    Proteinuria, unspecified type      Plan: High blood pressure Continue Amlodipine 10 mg daily in the morning for blood pressure Continue Chlorthalidone 25 mg daily in the morning for blood pressure STOP Losartan blood pressure medication ADD or begin Valsartan 320 mg daily in the morning for blood pressure ADD or begin Atenolol 25 mg daily in the morning  for blood pressure Your blood pressure is way too high so it will likely take multiple medications to get this under control I am changing out the losartan for valsartan as I think this will work better than the current losartan you are taking   High cholesterol Begin a atorvastatin/Lipitor 20 mg daily at bedtime for cholesterol and to reduce risk for heart disease   Diabetes I reduced you Metformin to 1 tablet twice daily given your kidney function is abnormal Continue glipizide 10 mg daily Your diabetes marker is okay at the moment, stable Check your blood sugars fasting in the  mornings.  The goal is less than 130 fasting glucose See your eye doctor yearly for eye exam and make sure they send a copy of your eye exam to [facility]   Testicular issue and blood in the urine I have made a referral to [facility] Expect a call back within the next 7 to 10 days about this appointment   Please call if any issues with obtaining medications, price issues, insurance not covering the medicines or any other issues that we do not delay treatment   Please make sure you update your phone number and email on file with [place] so we can communicate with you    Rodney Schultz was seen today for med check.  Diagnoses and all orders for this visit:  Essential hypertension, benign  Type 2 diabetes mellitus with other specified complication, without long-term current use of insulin (HCC) -     Urinalysis, Routine w reflex microscopic -     HgB A1c  Hyperlipidemia, unspecified hyperlipidemia type  Testicular abnormality -     Ambulatory referral to Urology  Gross hematuria -     Ambulatory referral to Urology  Hematuria, unspecified type  Proteinuria, unspecified type  Other orders -     amLODipine (NORVASC) 10 MG tablet; Take 1 tablet (10 mg total) by mouth daily. -     chlorthalidone (HYGROTON) 25 MG tablet; TAKE 1 TABLET (25 MG TOTAL) BY MOUTH DAILY. -     glipiZIDE (GLUCOTROL) 10 MG tablet; Take 1 tablet (10 mg total) by mouth daily before breakfast. -     metFORMIN (GLUCOPHAGE-XR) 500 MG 24 hr tablet; Take 1 tablet (500 mg total) by mouth 2 (two) times daily. -     valsartan (DIOVAN) 320 MG tablet; Take 1 tablet (320 mg total) by mouth daily. -     atenolol (TENORMIN) 25 MG tablet; Take 1 tablet (25 mg total) by mouth daily. -     atorvastatin (LIPITOR) 20 MG tablet; Take 1 tablet (20 mg total) by mouth daily.    Follow up: 14mo

## 2021-03-27 NOTE — Telephone Encounter (Signed)
Mailed pt AVS, also set up mychart while pt was in office today.

## 2021-03-28 ENCOUNTER — Other Ambulatory Visit: Payer: Self-pay

## 2021-03-28 LAB — URINALYSIS, ROUTINE W REFLEX MICROSCOPIC
Bilirubin, UA: NEGATIVE
Glucose, UA: NEGATIVE
Ketones, UA: NEGATIVE
Leukocytes,UA: NEGATIVE
Nitrite, UA: NEGATIVE
Specific Gravity, UA: 1.021 (ref 1.005–1.030)
Urobilinogen, Ur: 0.2 mg/dL (ref 0.2–1.0)
pH, UA: 6 (ref 5.0–7.5)

## 2021-03-28 LAB — MICROSCOPIC EXAMINATION
Bacteria, UA: NONE SEEN
Casts: NONE SEEN /LPF
Epithelial Cells (non renal): NONE SEEN /HPF (ref 0–10)
WBC, UA: NONE SEEN /HPF (ref 0–5)

## 2021-03-28 NOTE — Progress Notes (Signed)
Mailed pt AVS, called and left message for pt

## 2021-04-01 NOTE — Progress Notes (Signed)
Left another message for pt.

## 2021-04-04 ENCOUNTER — Telehealth: Payer: Self-pay

## 2021-04-05 NOTE — Telephone Encounter (Signed)
Have left 2 messages this week for pt to call back regarding [facility] instructions

## 2021-04-05 NOTE — Progress Notes (Signed)
Have left 2 messages this week for pt to call back regarding [facility] instructions & mailed AVS

## 2021-06-18 ENCOUNTER — Emergency Department (HOSPITAL_COMMUNITY): Payer: 59

## 2021-06-18 ENCOUNTER — Inpatient Hospital Stay (HOSPITAL_COMMUNITY)
Admission: EM | Admit: 2021-06-18 | Discharge: 2021-06-20 | DRG: 291 | Disposition: A | Discharge status: Home / Self Care | Payer: 59 | Attending: Family Medicine | Admitting: Family Medicine

## 2021-06-18 ENCOUNTER — Encounter (HOSPITAL_COMMUNITY): Payer: Self-pay | Admitting: Emergency Medicine

## 2021-06-18 ENCOUNTER — Other Ambulatory Visit: Payer: Self-pay

## 2021-06-18 DIAGNOSIS — N179 Acute kidney failure, unspecified: Secondary | ICD-10-CM | POA: Diagnosis present

## 2021-06-18 DIAGNOSIS — I509 Heart failure, unspecified: Secondary | ICD-10-CM

## 2021-06-18 DIAGNOSIS — F458 Other somatoform disorders: Secondary | ICD-10-CM | POA: Diagnosis present

## 2021-06-18 DIAGNOSIS — Z79899 Other long term (current) drug therapy: Secondary | ICD-10-CM

## 2021-06-18 DIAGNOSIS — R059 Cough, unspecified: Secondary | ICD-10-CM | POA: Diagnosis present

## 2021-06-18 DIAGNOSIS — R778 Other specified abnormalities of plasma proteins: Secondary | ICD-10-CM | POA: Diagnosis not present

## 2021-06-18 DIAGNOSIS — I160 Hypertensive urgency: Secondary | ICD-10-CM | POA: Diagnosis not present

## 2021-06-18 DIAGNOSIS — Z87891 Personal history of nicotine dependence: Secondary | ICD-10-CM

## 2021-06-18 DIAGNOSIS — I5043 Acute on chronic combined systolic (congestive) and diastolic (congestive) heart failure: Secondary | ICD-10-CM | POA: Diagnosis present

## 2021-06-18 DIAGNOSIS — I110 Hypertensive heart disease with heart failure: Secondary | ICD-10-CM | POA: Diagnosis not present

## 2021-06-18 DIAGNOSIS — Z7984 Long term (current) use of oral hypoglycemic drugs: Secondary | ICD-10-CM

## 2021-06-18 DIAGNOSIS — R7989 Other specified abnormal findings of blood chemistry: Secondary | ICD-10-CM | POA: Insufficient documentation

## 2021-06-18 DIAGNOSIS — R0609 Other forms of dyspnea: Secondary | ICD-10-CM

## 2021-06-18 DIAGNOSIS — R000 Tachycardia, unspecified: Secondary | ICD-10-CM | POA: Diagnosis present

## 2021-06-18 DIAGNOSIS — J441 Chronic obstructive pulmonary disease with (acute) exacerbation: Secondary | ICD-10-CM | POA: Diagnosis present

## 2021-06-18 DIAGNOSIS — K219 Gastro-esophageal reflux disease without esophagitis: Secondary | ICD-10-CM | POA: Diagnosis present

## 2021-06-18 DIAGNOSIS — T502X5A Adverse effect of carbonic-anhydrase inhibitors, benzothiadiazides and other diuretics, initial encounter: Secondary | ICD-10-CM | POA: Diagnosis present

## 2021-06-18 DIAGNOSIS — Z20822 Contact with and (suspected) exposure to covid-19: Secondary | ICD-10-CM | POA: Diagnosis present

## 2021-06-18 DIAGNOSIS — E785 Hyperlipidemia, unspecified: Secondary | ICD-10-CM | POA: Diagnosis present

## 2021-06-18 DIAGNOSIS — Z887 Allergy status to serum and vaccine status: Secondary | ICD-10-CM

## 2021-06-18 DIAGNOSIS — R0602 Shortness of breath: Secondary | ICD-10-CM

## 2021-06-18 DIAGNOSIS — R319 Hematuria, unspecified: Secondary | ICD-10-CM | POA: Diagnosis present

## 2021-06-18 DIAGNOSIS — I5022 Chronic systolic (congestive) heart failure: Secondary | ICD-10-CM

## 2021-06-18 LAB — URINALYSIS, ROUTINE W REFLEX MICROSCOPIC
Bilirubin Urine: NEGATIVE
Glucose, UA: NEGATIVE mg/dL
Ketones, ur: NEGATIVE mg/dL
Leukocytes,Ua: NEGATIVE
Nitrite: NEGATIVE
Protein, ur: >300 mg/dL — AB
Specific Gravity, Urine: >1.03 — ABNORMAL HIGH (ref 1.005–1.030)
pH: 6 (ref 5.0–8.0)

## 2021-06-18 LAB — TROPONIN I (HIGH SENSITIVITY)
Troponin I (High Sensitivity): 48 ng/L — ABNORMAL HIGH (ref <18)
Troponin I (High Sensitivity): 49 ng/L — ABNORMAL HIGH (ref <18)

## 2021-06-18 LAB — CBC WITH DIFFERENTIAL/PLATELET
Abs Immature Granulocytes: 0.01 10*3/uL (ref 0.00–0.07)
Basophils Absolute: 0 10*3/uL (ref 0.0–0.1)
Basophils Relative: 0 %
Eosinophils Absolute: 0.2 10*3/uL (ref 0.0–0.5)
Eosinophils Relative: 3 %
HCT: 40.5 % (ref 39.0–52.0)
Hemoglobin: 13.5 g/dL (ref 13.0–17.0)
Immature Granulocytes: 0 %
Lymphocytes Relative: 35 %
Lymphs Abs: 1.9 10*3/uL (ref 0.7–4.0)
MCH: 28.5 pg (ref 26.0–34.0)
MCHC: 33.3 g/dL (ref 30.0–36.0)
MCV: 85.4 fL (ref 80.0–100.0)
Monocytes Absolute: 0.6 10*3/uL (ref 0.1–1.0)
Monocytes Relative: 10 %
Neutro Abs: 2.9 10*3/uL (ref 1.7–7.7)
Neutrophils Relative %: 52 %
Platelets: 278 10*3/uL (ref 150–400)
RBC: 4.74 MIL/uL (ref 4.22–5.81)
RDW: 13.2 % (ref 11.5–15.5)
WBC: 5.6 10*3/uL (ref 4.0–10.5)
nRBC: 0 % (ref 0.0–0.2)

## 2021-06-18 LAB — URINALYSIS, MICROSCOPIC (REFLEX): Squamous Epithelial / HPF: NONE SEEN (ref 0–5)

## 2021-06-18 LAB — SODIUM, URINE, RANDOM: Sodium, Ur: 89 mmol/L

## 2021-06-18 LAB — BASIC METABOLIC PANEL
Anion gap: 8 (ref 5–15)
BUN: 23 mg/dL (ref 8–23)
CO2: 20 mmol/L — ABNORMAL LOW (ref 22–32)
Calcium: 8.9 mg/dL (ref 8.9–10.3)
Chloride: 109 mmol/L (ref 98–111)
Creatinine, Ser: 1.96 mg/dL — ABNORMAL HIGH (ref 0.61–1.24)
GFR, Estimated: 38 mL/min — ABNORMAL LOW (ref >60)
Glucose, Bld: 210 mg/dL — ABNORMAL HIGH (ref 70–99)
Potassium: 4 mmol/L (ref 3.5–5.1)
Sodium: 137 mmol/L (ref 135–145)

## 2021-06-18 LAB — RESP PANEL BY RT-PCR (FLU A&B, COVID) ARPGX2
Influenza A by PCR: NEGATIVE
Influenza B by PCR: NEGATIVE
SARS Coronavirus 2 by RT PCR: NEGATIVE

## 2021-06-18 LAB — BRAIN NATRIURETIC PEPTIDE: B Natriuretic Peptide: 436.2 pg/mL — ABNORMAL HIGH (ref 0.0–100.0)

## 2021-06-18 LAB — CREATININE, URINE, RANDOM: Creatinine, Urine: 126.03 mg/dL

## 2021-06-18 MED ORDER — INSULIN ASPART 100 UNIT/ML IJ SOLN
0.0000 [IU] | Freq: Three times a day (TID) | INTRAMUSCULAR | Status: DC
Start: 2021-06-19 — End: 2021-06-20
  Administered 2021-06-19 (×3): 1 [IU] via SUBCUTANEOUS

## 2021-06-18 MED ORDER — ATORVASTATIN CALCIUM 10 MG PO TABS
20.0000 mg | ORAL_TABLET | Freq: Every day | ORAL | Status: DC
  Administered 2021-06-18 – 2021-06-20 (×3): 20 mg via ORAL
  Filled 2021-06-18 (×3): qty 2

## 2021-06-18 MED ORDER — IPRATROPIUM-ALBUTEROL 0.5-2.5 (3) MG/3ML IN SOLN
3.0000 mL | Freq: Four times a day (QID) | RESPIRATORY_TRACT | Status: DC
  Filled 2021-06-18: qty 3

## 2021-06-18 MED ORDER — PREDNISONE 5 MG PO TABS
5.0000 mg | ORAL_TABLET | Freq: Every day | ORAL | Status: DC
  Administered 2021-06-19: 5 mg via ORAL
  Filled 2021-06-18 (×2): qty 1

## 2021-06-18 MED ORDER — AZITHROMYCIN 250 MG PO TABS
500.0000 mg | ORAL_TABLET | Freq: Every day | ORAL | Status: DC
  Administered 2021-06-18 – 2021-06-19 (×2): 500 mg via ORAL
  Filled 2021-06-18 (×2): qty 2

## 2021-06-18 MED ORDER — IPRATROPIUM-ALBUTEROL 0.5-2.5 (3) MG/3ML IN SOLN: 3.0000 mL | Freq: Four times a day (QID) | RESPIRATORY_TRACT | Status: DC | PRN

## 2021-06-18 MED ORDER — ATENOLOL 25 MG PO TABS
25.0000 mg | ORAL_TABLET | Freq: Once | ORAL | Status: AC
  Administered 2021-06-18: 25 mg via ORAL
  Filled 2021-06-18: qty 1

## 2021-06-18 MED ORDER — ENOXAPARIN SODIUM 40 MG/0.4ML IJ SOSY
40.0000 mg | PREFILLED_SYRINGE | INTRAMUSCULAR | Status: DC
  Administered 2021-06-18 – 2021-06-19 (×2): 40 mg via SUBCUTANEOUS
  Filled 2021-06-18 (×2): qty 0.4

## 2021-06-18 MED ORDER — FUROSEMIDE 10 MG/ML IJ SOLN
20.0000 mg | Freq: Once | INTRAMUSCULAR | Status: AC
  Administered 2021-06-18: 20 mg via INTRAVENOUS
  Filled 2021-06-18: qty 2

## 2021-06-18 MED ORDER — LABETALOL HCL 5 MG/ML IV SOLN: 20.0000 mg | Freq: Once | INTRAVENOUS | Status: DC

## 2021-06-18 MED ORDER — IPRATROPIUM-ALBUTEROL 0.5-2.5 (3) MG/3ML IN SOLN
3.0000 mL | Freq: Once | RESPIRATORY_TRACT | Status: AC
  Administered 2021-06-18: 3 mL via RESPIRATORY_TRACT
  Filled 2021-06-18: qty 3

## 2021-06-18 MED ORDER — AMLODIPINE BESYLATE 5 MG PO TABS
10.0000 mg | ORAL_TABLET | Freq: Every day | ORAL | Status: DC
  Administered 2021-06-19: 10 mg via ORAL
  Filled 2021-06-18: qty 2

## 2021-06-18 MED ORDER — ATENOLOL 25 MG PO TABS: 25.0000 mg | ORAL_TABLET | Freq: Every day | ORAL | Status: DC

## 2021-06-18 MED ORDER — AMLODIPINE BESYLATE 5 MG PO TABS
10.0000 mg | ORAL_TABLET | Freq: Once | ORAL | Status: AC
  Administered 2021-06-18: 10 mg via ORAL
  Filled 2021-06-18: qty 2

## 2021-06-18 NOTE — ED Triage Notes (Signed)
Patient c/o shortness of breath with exertion x 1 week. States he recently quit smoking and has been smoking since he was 60 years old. Denies any pain. Reports dry cough. Has not taken BP medicine in about 4 days since he has not felt well.

## 2021-06-18 NOTE — ED Provider Notes (Signed)
[facility] EMERGENCY DEPARTMENT Provider Note   CSN: [id number] Arrival date & time: 06/18/21  [id number]     History Chief Complaint  Patient presents with   Shortness of Breath    Rodney Schultz is a 60 y.o. male with history of hypertension, diabetes, hyperlipidemia presents the emergency department with 1 week of shortness of breath.  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  He denies any abdominal or leg swelling, chest pain, nausea, vomiting, diaphoresis, lightheadedness, or dizziness.  He does report a dry cough.  Patiently recently stopped smoking a week ago.  He was 1/2 pack/day smoker for the past 50 years.  Additionally, the patient has not taken her blood pressure medicine for the past 4 days because of shortness of breath was concerning him. He denies any EtOH or drugs.  No known drug allergies.   Shortness of Breath Associated symptoms: cough   Associated symptoms: no abdominal pain, no chest pain, no ear pain, no fever, no rash, no sore throat and no vomiting       Past Medical History:  Diagnosis Date   Diabetes mellitus without complication (HCC)    GERD (gastroesophageal reflux disease)    Hypertension     Patient Active Problem List   Diagnosis Date Noted   Heart failure ([place]) 06/18/2021   Testicular abnormality 03/27/2021   Hyperlipidemia 03/27/2021   Proteinuria 03/27/2021   Essential hypertension, benign 10/22/2020   Hematuria 10/22/2020   Diabetes mellitus ([place]) 10/22/2020   Abnormal EKG 10/22/2020   Type 2 diabetes mellitus with hyperglycemia ([place]) 03/21/2019    Past Surgical History:  Procedure Laterality Date   testical  Right        Family History  Problem Relation Age of Onset   Healthy Mother     Social History   Tobacco Use   Smoking status: Former    Packs/day: 0.10    Types: Cigarettes   Smokeless  tobacco: Never   Tobacco comments:    Last smoked on 04/03/19. Pt reports that he only smoke when he is in pain  Vaping Use   Vaping Use: Never used  Substance Use Topics   Alcohol use: Not Currently   Drug use: Not Currently    Home Medications Prior to Admission medications   Medication Sig Start Date End Date Taking? Authorizing Provider  amLODipine (NORVASC) 10 MG tablet Take 1 tablet (10 mg total) by mouth daily. 03/27/21 03/27/22 Yes Tysinger, Camelia Eng, PA-C  atenolol (TENORMIN) 25 MG tablet Take 1 tablet (25 mg total) by mouth daily. 03/27/21  Yes Tysinger, Camelia Eng, PA-C  atorvastatin (LIPITOR) 20 MG tablet Take 1 tablet (20 mg total) by mouth daily. 03/27/21 03/27/22 Yes Tysinger, Camelia Eng, PA-C  chlorthalidone (HYGROTON) 25 MG tablet TAKE 1 TABLET (25 MG TOTAL) BY MOUTH DAILY. Patient taking differently: Take 25 mg by mouth daily. 03/27/21 03/27/22 Yes Tysinger, Camelia Eng, PA-C  glipiZIDE (GLUCOTROL) 10 MG tablet Take 1 tablet (10 mg total) by mouth daily before breakfast. 03/27/21  Yes Tysinger, Camelia Eng, PA-C  Menthol-Methyl Salicylate (MUSCLE RUB EX) Apply 1 application topically 2 (two) times daily as needed (leg pain).   Yes [provider]  metFORMIN (GLUCOPHAGE-XR) 500 MG 24 hr tablet Take 1 tablet (500 mg total) by mouth 2 (two) times daily. 03/27/21 03/27/22 Yes Tysinger, Camelia Eng, PA-C  Triprolidine-Pseudoephedrine (COLD/ALLERGY/SINUS PO) Take  1 tablet by mouth every 6 (six) hours as needed (sinus symptoms).   Yes [provider]  valsartan (DIOVAN) 320 MG tablet Take 1 tablet (320 mg total) by mouth daily. 03/27/21  Yes Tysinger, Camelia Eng, PA-C  loratadine (CLARITIN) 10 MG tablet Take 10 mg by mouth daily as needed for allergies.  09/13/20  [provider]  omeprazole (PRILOSEC) 20 MG capsule Take 20 mg by mouth daily.  04/12/19  [provider]  pioglitazone (ACTOS) 15 MG tablet Take 15 mg by mouth daily.  04/12/19  [provider]  pravastatin (PRAVACHOL)  10 MG tablet Take 1 tablet (10 mg total) by mouth daily. Patient unsure of dose at this time 07/12/19 09/13/20  Robyn Haber, MD  triamterene-hydrochlorothiazide (MAXZIDE-25) 37.5-25 MG tablet Take 1 tablet by mouth daily. 04/28/19 06/13/19  Robyn Haber, MD    Allergies    Influenza vaccines  Review of Systems   Review of Systems  Constitutional:  Negative for chills and fever.  HENT:  Negative for congestion, ear pain, rhinorrhea and sore throat.   Eyes:  Negative for pain and visual disturbance.  Respiratory:  Positive for cough and shortness of breath.   Cardiovascular:  Negative for chest pain, palpitations and leg swelling.  Gastrointestinal:  Negative for abdominal pain and vomiting.  Genitourinary:  Negative for dysuria and hematuria.  Musculoskeletal:  Negative for arthralgias, back pain and myalgias.  Skin:  Negative for color change and rash.  Neurological:  Negative for dizziness, seizures, syncope, weakness and light-headedness.  All other systems reviewed and are negative.  Physical Exam Updated Vital Signs BP (!) 195/137   Pulse 94   Temp 98.4 F (36.9 C)   Resp 16   Ht 5\' 10"  (1.778 m)   Wt 86.2 kg   SpO2 99%   BMI 27.26 kg/m   Physical Exam Vitals and nursing note reviewed.  Constitutional:      General: He is not in acute distress.    Appearance: Normal appearance. He is not toxic-appearing.  HENT:     Head: Normocephalic and atraumatic.  Eyes:     General: No scleral icterus. Cardiovascular:     Rate and Rhythm: Normal rate and regular rhythm.     Comments: Borderline tachycardic at 96 when I was in the room Pulmonary:     Effort: Pulmonary effort is normal. Tachypnea present.     Breath sounds: Normal breath sounds.     Comments: Patient is mildly tachypneic at 22, but no respiratory distress. Satting 98% on RA. Patient speaking in full sentences with ease.  No respiratory distress, accessory muscle use, tripoding, cyanosis, or nasal flaring  present.  No retractions.  On auscultation exam, patient has diminished breath sounds with bilateral expiratory wheezing.  No rhonchi appreciated. Chest:     Chest wall: No deformity.  Abdominal:     General: Abdomen is flat. Bowel sounds are normal.     Palpations: Abdomen is soft.     Tenderness: There is no abdominal tenderness. There is no guarding or rebound.  Musculoskeletal:        General: No deformity.     Cervical back: Normal range of motion.     Right lower leg: No tenderness. No edema.     Left lower leg: No tenderness. No edema.  Skin:    General: Skin is warm and dry.  Neurological:     General: No focal deficit present.     Mental Status: He is alert. Mental  status is at baseline.     Motor: No weakness.    ED Results / Procedures / Treatments   Labs (all labs ordered are listed, but only abnormal results are displayed) Labs Reviewed  BASIC METABOLIC PANEL - Abnormal; Notable for the following components:      Result Value   CO2 20 (*)    Glucose, Bld 210 (*)    Creatinine, Ser 1.96 (*)    GFR, Estimated 38 (*)    All other components within normal limits  BRAIN NATRIURETIC PEPTIDE - Abnormal; Notable for the following components:   B Natriuretic Peptide 436.2 (*)    All other components within normal limits  TROPONIN I (HIGH SENSITIVITY) - Abnormal; Notable for the following components:   Troponin I (High Sensitivity) 48 (*)    All other components within normal limits  TROPONIN I (HIGH SENSITIVITY) - Abnormal; Notable for the following components:   Troponin I (High Sensitivity) 49 (*)    All other components within normal limits  RESP PANEL BY RT-PCR (FLU A&B, COVID) ARPGX2  CBC WITH DIFFERENTIAL/PLATELET    EKG EKG Interpretation  Date/Time:  Wednesday June 18 2021 06:57:53 EST Ventricular Rate:  102 PR Interval:  158 QRS Duration: 98 QT Interval:  386 QTC Calculation: 503 R Axis:   110 Text Interpretation: Sinus tachycardia Possible Left  atrial enlargement Left posterior fascicular block Left ventricular hypertrophy with repolarization abnormality ( Cornell product ) Abnormal ECG Confirmed by Elnora Morrison [id number]) on 06/18/2021 10:31:35 AM  Radiology DG Chest 2 View  Result Date: 06/18/2021 CLINICAL DATA:  Shortness of breath. EXAM: CHEST - 2 VIEW COMPARISON:  None. FINDINGS: Cardiopericardial silhouette is at upper limits of normal for size. Hazy parahilar opacities identified in both lungs, potentially related to pulmonary edema although infectious/inflammatory etiology not excluded. No pleural effusion or pneumothorax. The visualized bony structures of the thorax show no acute abnormality. IMPRESSION: Hazy bilateral parahilar lung opacity. Features may be related to pulmonary edema although infectious/inflammatory etiology not excluded. Electronically Signed   By: Misty Stanley M.D.   On: 06/18/2021 07:33    Procedures Procedures   Medications Ordered in ED Medications  ipratropium-albuterol (DUONEB) 0.5-2.5 (3) MG/3ML nebulizer solution 3 mL (3 mLs Nebulization Given 06/18/21 1345)  atenolol (TENORMIN) tablet 25 mg (25 mg Oral Given 06/18/21 1345)  amLODipine (NORVASC) tablet 10 mg (10 mg Oral Given 06/18/21 1345)    ED Course  I have reviewed the triage vital signs and the nursing notes.  Pertinent labs & imaging results that were available during my care of the patient were reviewed by me and considered in my medical decision making (see chart for details).  60 year old male presents to the emergency department for evaluation of gradually worsening shortness of breath for the past week.  Differential diagnosis includes but is not limited to PE, pneumonia, pneumothorax, viral illness, COPD exacerbation, CHF, ACS.  On physical exam, patient is in no acute distress.  Satting 98% on room air.  Patient is mildly tachycardic.  Mildly tachypneic at 22 although patient is speaking in full sentences with ease.  Patient is  hypertensive at 190/147.  The patient reports he has not taken his blood pressure medication the past 4 days because of the shortness of breath concern.  His home medication of amlodipine and atenolol ordered.  No leg swelling or abdominal swelling appreciated on exam.  Patient does not appear to be fluid overload.  No weakness.  I personally reviewed and interpreted the patient's  labs and imaging.  Patient has elevated troponin with initial 48 and repeat 149.  All these are flat, the elevated troponin is concerning.  BMP shows no electrolyte abnormalities, although patient has hyperglycemia at 210 with an increased creatinine at 1.96 from 1.53 seven months ago.  BNP elevated at 436.  No previous compare to.  Patient is negative for COVID and flu.  CBC shows no leukocytosis or anemia.  Chest x-ray shows some hazy bilateral perihilar lung opacities that may be consistent with pulmonary edema.  Per confirm EKG that shows sinus tachycardia with possible left atrial enlargement, left posterior fascicular block, left ventricular hypertrophy with repolarization abnormality.  Given the elevated troponin and BNP, cardiology was consulted.  Spoke to Rosann Auerbach, NP with cardiology, who recommended admission to medicine with follow-up with cardiology inpatient. Discussed lab and imaging results with patient. Patient is amenable to admission.  Admission placed at this time. [facility] admitting provider.    MDM Rules/Calculators/A&P                          Final Clinical Impression(s) / ED Diagnoses Final diagnoses:  SOB (shortness of breath)  Elevated troponin  Hypertensive urgency    Rx / DC Orders ED Discharge Orders     None        Achille Rich, PA-C 06/18/21 1502    Blane Ohara, MD 06/19/21 1347

## 2021-06-18 NOTE — Hospital Course (Addendum)
Rodney Schultz is a 60 y.o. male presenting with shortness of breath. PMH is significant for T2DM, HTN, HLD, Hematuria s/p orchiectomy, GERD.   Hospital course by problem listed below:  Shortness of breath CHF Exacerbation, Systolic and diastolic HF Patient was admitted for shortness of breath that concerning for new onset CHF. BNP elevated to 436 and troponin mildly elevated but trended flat. Chest x-ray showed hazy perihilar opacities and cardiac silhouette in upper limits of normal.  EKG showed mild sinus tachycardia at 102 bpm and significant LVH. Cardiology was consulted and recommended admission.  On examination patient had bilateral crackles in lower lung fields posteriorly and was saturating well on room air.  Had some mild increased work of breathing while in room.  Did not have any lower extremity edema and had mild JVD 1 inch above supraclavicular notch.  Was given 20 mg IV Lasix and demonstrated rapid response with improvement in respiratory status.  Echo showed LVEF 20-25%, global hypokinesis of the LV, and mild dilatation of the LV. Moderate LVH also noted as well as Grade III diastolic dysfunction. RV systolic function also severely reduced. Moderate dilatation of LA. No valvular disease. Due to patient's renal impairment discussed below, he was not placed on an ACE/ARB/ARNI and was not started on daily diuretic, but was discharged with PRN Lasix and close follow-up in HF clinic with plans for outpatient L/R Heart Cath.   AKI, likely prerenal Creatinine 1.96 on admission.  Baseline around 1.3-1.5.  Creatinine initially improved with diuresis, but increased to 2.14 after two doses of Lasix. Patient's home chlorthalidone and valsartan were transitioned to hydralazine and isosorbide and his home atenolol was transitioned to carvedilol. Discharged with PRN Lasix as we feared his kidneys would not tolerate daily diuresis.   Chronic conditions of HTN, T2DM, HLD remained stable  Follow up  recommendations: Recommend adding SGLT2 given T2DM and systolic heart failure. He would benefit from both cardiac and renal protection.  AKI: Recommend follow-up BMP to monitor renal function off nephrotoxic meds.

## 2021-06-18 NOTE — H&P (Addendum)
[facility] Admission History and Physical Service Pager: [phone]  Patient name: Rodney Schultz Medical record number: [id number] Date of birth: [birth year] Age: 60 y.o. Gender: male  Primary Care Provider: Carlena Hurl, PA-C Consultants: Cardiology Code Status: FULL Preferred Emergency Contact: Rodney Schultz [phone] (home) [phone] (cell)  Chief Complaint: Shortness of breath  Assessment and Plan: Rodney Schultz is a 60 y.o. male presenting with shortness of breath. PMH is significant for T2DM, HTN, HLD, Hematuria s/p orchiectomy, GERD.   Shortness of breath Patient was admitted for shortness of breath that started last Wednesday, concerning for CHF. Vital signs in the ED showed saturations in high 90s on room air, hypertensive (talked about in problem below), afebrile and mostly normocardic and normal respirations. Labs in the ED showed BNP elevated to 436 and troponin 48, 49, and mildly decreased bicarb.  Negative respiratory panel, normal CBC, and AKI.  Chest x-ray showed hazy perihilar opacities and cardiac silhouette in upper limits of normal.  EKG showed mild sinus tachycardia at 102 bpm and significant LVH.  Was given DuoNeb x 1 and felt improved afterwards. Cardiology was consulted by ED provider and recommended admission with ongoing follow up in hospital given concern for new onset heart failure.  On examination patient had bilateral crackles in lower lung fields posteriorly and was saturating well on room air.  Had some mild increased work of breathing while in room.  Did not have any lower extremity edema and had mild JVD 1 inch above supraclavicular notch.  Differential includes new onset heart failure given elevated BNP, crackles on our examination, and shortness of breath. COPD exacerbation possible given patient has long history of smoking, has been having increased cough with sputum production since 2 weeks ago, wheezing noted  on ED provider exam, and improvement with DuoNeb in ED.  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 Consider malignancy given long smoking history however no hematemesis or weight loss. Unlikely to be related to cardiac etiology given troponins 48>49 and denies any kind of chest pain EKG not showing any acute ST elevations. -Admit to med telemetry for observation, attending Dr. Erin Hearing -Cardiology consulted, appreciate recommendations -Follow-up echo -One time 20 IV Lasix dose -prednisone 5 mg daily for 5 days, DuoNebs every 6 hours for 3 treatments, and azithromycin 500 mg daily for 5 days for possible COPD exacerbation -Daily weights -Strict I's and O's -Vitals per floor protocol -PT/OT evaluate and treat -A.m. BMP, CBC  AKI, likely prerenal Creatinine 1.96 on admission.  Baseline around 1.3-1.5.  No CKD charted baseline and proteinuria likely CKD.  Says he has been urinating well.  Could be secondary to fluid overload in new onset CHF. -Monitor on BMP -UA -Urine creatinine -Urine sodium  HTN Says he has not taken since last Friday 11/25.  Denies any chest pain, vision changes, or headaches. Home meds of amlodipine 10 mg, atenolol 25 mg, chlorthalidone 25 mg, and valsartan 320 mg daily.  Blood pressures in the ED were [id number] systolics and [id number] diastolic. S/p initiation of amlodipine 10 mg and atenolol 25 mg in ED. -Reordered amlodipine 10 mg and atenolol 25 mg home medications -Holding chlorthalidone and valsartan given AKI -vitals per unit routine  T2DM Last hemoglobin A1c of 7.0. Home meds of glipizide 10 mg and metformin 500 mg BID.  Says it has been well controlled. -Carb modified diet -A1c -CBG  TID with meals and qHS -very sensitive sliding scale insulin TID given insulin naivety  HLD Home meds of atorvastatin 20 mg daily. -Continue atorvastatin in  hospital  Hematuria s/p orchiectomy  Hx proteinuria in chart Denies any hematuria and did not disclose testicular surgery. -UA  FEN/GI: Carb controlled diet Prophylaxis: Lovenox  Disposition: Med tele  History of Present Illness:  Rodney Schultz is a 60 y.o. male presenting with shortness of breath.   Reports he quit smoking one week ago on Tuesday, out of the blue. He was smoking around 1 pack per week. On Wednesday he developed shortness of breath. 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 He reports inhaling a lot of wood particles and soot at work despite wearing masks and protection.   He says his shortness of breath comes and goes since last Wednesday. It occurs during exertion (walking, pulling wood off conveyor belt at work). He denies orthopnea, sleeps well on back without feeling smothered. He does breathe hard and fast at baseline which fiancee corroborates.   He says he started smoking at 60 yo and did not smoke for 8 years while in jail/prison. He started back smoking in 2020 and stopped last Tuesday. He has been having a dry cough for 2 weeks. After he quit smoking he felt like he couldn't cough because it was so dry. He had some feeling of globus sensation or feeling of phlegm that won't come out. Denies hemoptysis, edema/swelling, fevers, other systemic illness symptoms. Reports having lung evaluation in prison about 2015, does not recall being given lung diagnosis like asthma or COPD.   Denies chest pain. Reports never having chest pain really.   Review Of Systems: Per HPI with the following additions:   Review of Systems  Constitutional:  Negative for fever.  HENT:  Negative for sore throat and trouble swallowing.   Eyes:  Negative for visual disturbance.  Respiratory:  Positive for cough. Negative for choking.   Cardiovascular:  Negative for chest pain and leg swelling.   Gastrointestinal:  Negative for abdominal pain, constipation and diarrhea.  Genitourinary:  Negative for difficulty urinating, dysuria and hematuria.    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   Hyperlipidemia 03/27/2021   Proteinuria 03/27/2021   Essential hypertension, benign 10/22/2020   Hematuria 10/22/2020   Diabetes mellitus (HCC) 10/22/2020   Abnormal EKG 10/22/2020   Type 2 diabetes mellitus with hyperglycemia (HCC) 03/21/2019    Past Medical History: Past Medical History:  Diagnosis Date   Diabetes mellitus without complication (HCC)    GERD (gastroesophageal reflux disease)    Hypertension     Past Surgical History: Past Surgical History:  Procedure Laterality Date   testical  Right    Abdominal surgery at age 25 after trauma due to running through a laundromat window  Social History: Social History   Tobacco Use   Smoking status: Former    Packs/day: 0.10    Types: Cigarettes   Smokeless tobacco: Never   Tobacco comments:    Last smoked on 04/03/19. Pt reports that he only smoke when he is in pain  Vaping Use   Vaping Use: Never used  Substance Use Topics   Alcohol use: Not Currently   Drug use: Not Currently   No drug use currently "Way back in the day used  to get high" No alcohol use currently Does do very infrequent social drinking, cannot remember last drink, says months ago No hx ETOH withdrawal  Additional social history:  - works at [employer] - lives with fiancee only - prison x8 years  Please also refer to relevant sections of EMR.  Family History: Family History  Problem Relation Age of Onset   Healthy Mother     Allergies and Medications: Allergies  Allergen Reactions   Influenza Vaccines     Sick- was put in hospital    No current facility-administered medications on file prior to encounter.   Current Outpatient Medications on File  Prior to Encounter  Medication Sig Dispense Refill   amLODipine (NORVASC) 10 MG tablet Take 1 tablet (10 mg total) by mouth daily. 90 tablet 3   atenolol (TENORMIN) 25 MG tablet Take 1 tablet (25 mg total) by mouth daily. 30 tablet 0   atorvastatin (LIPITOR) 20 MG tablet Take 1 tablet (20 mg total) by mouth daily. 90 tablet 3   chlorthalidone (HYGROTON) 25 MG tablet TAKE 1 TABLET (25 MG TOTAL) BY MOUTH DAILY. (Patient taking differently: Take 25 mg by mouth daily.) 90 tablet 3   glipiZIDE (GLUCOTROL) 10 MG tablet Take 1 tablet (10 mg total) by mouth daily before breakfast. 90 tablet 1   Menthol-Methyl Salicylate (MUSCLE RUB EX) Apply 1 application topically 2 (two) times daily as needed (leg pain).     metFORMIN (GLUCOPHAGE-XR) 500 MG 24 hr tablet Take 1 tablet (500 mg total) by mouth 2 (two) times daily. 180 tablet 1   Triprolidine-Pseudoephedrine (COLD/ALLERGY/SINUS PO) Take 1 tablet by mouth every 6 (six) hours as needed (sinus symptoms).     valsartan (DIOVAN) 320 MG tablet Take 1 tablet (320 mg total) by mouth daily. 30 tablet 0   [DISCONTINUED] loratadine (CLARITIN) 10 MG tablet Take 10 mg by mouth daily as needed for allergies.     [DISCONTINUED] omeprazole (PRILOSEC) 20 MG capsule Take 20 mg by mouth daily.     [DISCONTINUED] pioglitazone (ACTOS) 15 MG tablet Take 15 mg by mouth daily.     [DISCONTINUED] pravastatin (PRAVACHOL) 10 MG tablet Take 1 tablet (10 mg total) by mouth daily. Patient unsure of dose at this time 90 tablet 3   [DISCONTINUED] triamterene-hydrochlorothiazide (MAXZIDE-25) 37.5-25 MG tablet Take 1 tablet by mouth daily. 90 tablet 3    Objective: BP (!) 153/118   Pulse 73   Temp 98.4 F (36.9 C)   Resp (!) 30   Ht 5\' 10"  (1.778 m)   Wt 86.2 kg   SpO2 100%   BMI 27.26 kg/m  Exam: General: NAD, laying comfortably in bed, nontoxic, alert and oriented and responsive to questions Eyes: No conjunctival injection, tracks movements well ENTM: No nasal drainage,  normocephalic atraumatic Cardiovascular: Mild JVD 1 inch above the clavicular notch bilaterally, regular rate and rhythm, no murmurs rubs or gallops.  2+ radial pulses and 2+ lower extremity pulses Respiratory: Clear to auscultation in frontal lung fields, crackles present in lower lung fields posteriorly.  Some mild dyspnea but saturating in high 90s on room air.  No retractions. No wheezes no rales auscultated. Gastrointestinal: Soft, nondistended, nontender to palpation, surgical scar present on lower left abdomen healed LE: No lower extremity edema, moves all extremities well Derm: No rashes visualized Neuro: No focal neurologic deficits, able to respond appropriately Psych: Mood appropriate, pleasant  Labs and Imaging: CBC BMET  Recent Labs  Lab 06/18/21 0717  WBC 5.6  HGB 13.5  HCT 40.5  PLT 278   Recent Labs  Lab 06/18/21 0717  NA 137  K 4.0  CL 109  CO2 20*  BUN 23  CREATININE 1.96*  GLUCOSE 210*  CALCIUM 8.9     EKG: sinus tachycardia with LVH with HR 102, QTcB 503, nonspecific ST and T wave abnormalities  DG Chest 2 View  Result Date: 06/18/2021 CLINICAL DATA:  Shortness of breath. EXAM: CHEST - 2 VIEW COMPARISON:  None. FINDINGS: Cardiopericardial silhouette is at upper limits of normal for size. Hazy parahilar opacities identified in both lungs, potentially related to pulmonary edema although infectious/inflammatory etiology not excluded. No pleural effusion or pneumothorax. The visualized bony structures of the thorax show no acute abnormality. IMPRESSION: Hazy bilateral parahilar lung opacity. Features may be related to pulmonary edema although infectious/inflammatory etiology not excluded. Electronically Signed   By: Misty Stanley M.D.   On: 06/18/2021 07:33     Echo pending  Gerrit Heck, MD 06/18/2021, 2:41 PM PGY-1, [place] [facility] pager: [phone], text pages welcome   Upper Level Addendum: I have seen and evaluated this  patient along with Dr. Jinny Sanders and reviewed the above note, making necessary revisions as appropriate. I agree with the medical decision making and physical exam as noted above. Ezequiel Essex, MD PGY-2 [facility]

## 2021-06-18 NOTE — Progress Notes (Signed)
FPTS Brief Progress Note  S: Patient reports that he feels his breathing has improved. He states he has filled up 1.5 urine canisters since given the Lasix. Denies any chest pain, SOB, edema.   O: BP 135/81   Pulse (!) 52   Temp 98.4 F (36.9 C)   Resp 14   Ht 5\' 10"  (1.778 m)   Wt 86.2 kg   SpO2 94%   BMI 27.26 kg/m   Gen: Awake, alert, well appearing, in NAD Resp: CTAB, without wheezing/rhonchi/rales, normal effort Cardio: RRR  A/P: Acute CHF exacerbation vs. COPD  Breathing appears to be improved per patient. He appears stable and diuresing with IV lasix.  -Continue plan per H&P  Hypertension Appears improved since admission.  -Monitor; continue atenolol, amlodipine  - Orders reviewed. Labs for AM ordered, which was adjusted as needed.   Additional plan per H&P  , DO 06/18/2021, 10:41 PM PGY-2, [place] [facility] Night Resident  Please page [phone] with questions.

## 2021-06-18 NOTE — Consult Note (Addendum)
Cardiology Consultation:   Patient ID: Rodney Schultz MRN: [id number]; DOB: Apr 07, 1961  Admit date: 06/18/2021 Date of Consult: 06/18/2021  PCP:  Jac Canavan, PA-C   [facility] Providers Cardiologist: New    Patient Profile:   Rodney Schultz is a 60 y.o. male with a hx of hypertension, tobacco smoking and diabetes mellitus who is being seen 06/18/2021 for the evaluation of shortness of breath and elevated troponin at the request of Dr. Deirdre Priest.  Patient was seen by cardiologist remotely when in prison.  Had normal stress test at that time.  History of Present Illness:   Rodney Schultz has history of tobacco smoking since age 58.  He quit for 5 years between 2015-2000.  Since then he started to smoking about 1 pack/week.  Eventually quit smoking last Tuesday.  He reports since Thursday he is experiencing exertional shortness of breath.  Relieved with rest.  No associated chest tightness or palpitation.  He denies orthopnea, PND, syncope, lower extremity edema, dizziness or melena.  Reports his blood pressure runs in 150s/80 to 90s at home.  He had a worsening shortness of breath this morning at 7 AM while walking outside.  He came to ER for further evaluation.  Upon arrival he noted hypertensive at 206/155.  Patient reports he did not took his medication yesterday otherwise reports compliance.  Last dose on Monday.   BNP 436 High sensitivity troponin 48 >>49 Creatinine 1.9 Hemoglobin 13.5  CXR: IMPRESSION: Hazy bilateral parahilar lung opacity. Features may be related to pulmonary edema although infectious/inflammatory etiology not excluded.   Started on nebulizer treatment, antibiotic, amlodipine and atenolol.   Past Medical History:  Diagnosis Date   Diabetes mellitus without complication (HCC)    GERD (gastroesophageal reflux disease)    Hypertension     Past Surgical History:  Procedure Laterality Date   testical  Right      Inpatient  Medications: Scheduled Meds:  [START ON 06/19/2021] amLODipine  10 mg Oral Daily   [START ON 06/19/2021] atenolol  25 mg Oral Daily   atorvastatin  20 mg Oral Daily   azithromycin  500 mg Oral Daily   enoxaparin (LOVENOX) injection  40 mg Subcutaneous Q24H   furosemide  20 mg Intravenous Once   [START ON 06/19/2021] insulin aspart  0-6 Units Subcutaneous TID WC   ipratropium-albuterol  3 mL Nebulization Q6H   [START ON 06/19/2021] predniSONE  5 mg Oral Q breakfast   Continuous Infusions:  PRN Meds:   Allergies:    Allergies  Allergen Reactions   Influenza Vaccines     Sick- was put in hospital     Social History:   Social History   Socioeconomic History   Marital status: Divorced    Spouse name: Not on file   Number of children: Not on file   Years of education: Not on file   Highest education level: Not on file  Occupational History   Not on file  Tobacco Use   Smoking status: Former    Packs/day: 0.10    Types: Cigarettes   Smokeless tobacco: Never   Tobacco comments:    Last smoked on 04/03/19. Pt reports that he only smoke when he is in pain  Vaping Use   Vaping Use: Never used  Substance and Sexual Activity   Alcohol use: Not Currently   Drug use: Not Currently   Sexual activity: Not on file  Other Topics Concern   Not on file  Social History Narrative  Not on file   Social Determinants of Health   Financial Resource Strain: Not on file  Food Insecurity: Not on file  Transportation Needs: Not on file  Physical Activity: Not on file  Stress: Not on file  Social Connections: Not on file  Intimate Partner Violence: Not on file    Family History:   Family History  Problem Relation Age of Onset   Healthy Mother      ROS:  Please see the history of present illness.  All other ROS reviewed and negative.     Physical Exam/Data:   Vitals:   06/18/21 1330 06/18/21 1445 06/18/21 1615 06/18/21 1730  BP: (!) 185/143 (!) 195/137 (!) 153/118 (!) 161/112   Pulse: 92 94 73 77  Resp: 16 16 (!) 30 20  Temp:      SpO2: 100% 99% 100% 98%  Weight:      Height:       No intake or output data in the 24 hours ending 06/18/21 1753 Last 3 Weights 06/18/2021 03/27/2021 10/22/2020  Weight (lbs) 190 lb 187 lb 6.4 oz 180 lb 6.4 oz  Weight (kg) 86.183 kg 85.004 kg 81.829 kg     Body mass index is 27.26 kg/m.  General:  Well nourished, well developed, in no acute distress HEENT: normal Neck: no JVD Vascular: No carotid bruits; Distal pulses 2+ bilaterally Cardiac:  normal S1, S2; RRR; no murmur  Lungs:  clear to auscultation bilaterally, no wheezing, rhonchi or rales  Abd: soft, nontender, no hepatomegaly  Ext: no edema Musculoskeletal:  No deformities, BUE and BLE strength normal and equal Skin: warm and dry  Neuro:  CNs 2-12 intact, no focal abnormalities noted Psych:  Normal affect   EKG:  The EKG was personally reviewed and demonstrates: Sinus rhythm, LVH with repolarization abnormality Telemetry:  Telemetry was personally reviewed and demonstrates: Sinus rhythm  Relevant CV Studies: As above   Laboratory Data:  High Sensitivity Troponin:   Recent Labs  Lab 06/18/21 0717 06/18/21 0936  TROPONINIHS 48* 49*     Chemistry Recent Labs  Lab 06/18/21 0717  NA 137  K 4.0  CL 109  CO2 20*  GLUCOSE 210*  BUN 23  CREATININE 1.96*  CALCIUM 8.9  GFRNONAA 38*  ANIONGAP 8    Hematology Recent Labs  Lab 06/18/21 0717  WBC 5.6  RBC 4.74  HGB 13.5  HCT 40.5  MCV 85.4  MCH 28.5  MCHC 33.3  RDW 13.2  PLT 278    BNP Recent Labs  Lab 06/18/21 0717  BNP 436.2*    Radiology/Studies:  DG Chest 2 View  Result Date: 06/18/2021 CLINICAL DATA:  Shortness of breath. EXAM: CHEST - 2 VIEW COMPARISON:  None. FINDINGS: Cardiopericardial silhouette is at upper limits of normal for size. Hazy parahilar opacities identified in both lungs, potentially related to pulmonary edema although infectious/inflammatory etiology not excluded. No  pleural effusion or pneumothorax. The visualized bony structures of the thorax show no acute abnormality. IMPRESSION: Hazy bilateral parahilar lung opacity. Features may be related to pulmonary edema although infectious/inflammatory etiology not excluded. Electronically Signed   By: Kennith Center M.D.   On: 06/18/2021 07:33     Assessment and Plan:   Shortness of breath  Unknown etiology.  He reports shortness of breath with exertion since Thursday after quitting tobacco smoking on Tuesday.  Chest x-ray with pulmonary edema versus infection.  Started on antibiotic and nebulizer.  1 dose of IV Lasix.  See response and  follow-up tomorrow.  Pending echocardiogram.  2.  Hypertensive urgency -Upon arrival he noted hypertensive at 206/155.   -Last took his medication on Monday -Continue amlodipine and atenolol.  Consider changing atenolol to carvedilol for better blood pressure control.  3.  AKI -Hold chlorthalidone -Follow closely  4. Elevated troponin  -High sensitive troponin 48 >>49 -No chest pain   Risk Assessment/Risk Scores:  46}   HEAR Score (for undifferentiated chest pain):  HEAR Score: 4{  For questions or updates, please contact [facility] Please consult [email or web address] for contact info under    Lorelei Pont, PA  06/18/2021 5:53 PM   I have examined the patient and reviewed assessment and plan and discussed with patient.  Agree with above as stated.    Check echo.  Further w/u based on echo.  COntinue smoking cessation.  Smoked 1 pack per week.  Would order 7 mg patch at discharge.    Said BP was better controlled but he stopped his meds a few days ago.  I stressed the importance of BP control to prevent ESRD.  Will follow.   Lance Muss

## 2021-06-18 NOTE — ED Provider Notes (Signed)
Emergency Medicine Provider Triage Evaluation Note  Rodney Schultz , a 60 y.o. male  was evaluated in triage.  Pt complains of exertional shortness of breath over the last week.  No pain.  Recently quit smoking.  Has dry cough which is chronic at baseline.  No PND, orthopnea.  No lower extremity swelling.  No history of heart failure, PE, DVT.  Has not taken blood pressure medications over the last 4 days due to not feeling well.  No headache.  No paresthesias or weakness.  No known sick contact  Review of Systems  Positive: Cough, shortness of breath Negative: Pain, emesis, numbness or weakness  Physical Exam  BP (!) 206/155 (BP Location: Right Arm)   Pulse (!) 102   Temp 98.7 F (37.1 C)   Resp 17   Ht 5\' 10"  (1.778 m)   Wt 86.2 kg   SpO2 96%   BMI 27.26 kg/m  Gen:   Awake, no distress   Resp:  Normal effort, slight rales at right base, decreased lung sounds on the right MSK:   Moves extremities without difficulty  Other:    Medical Decision Making  Medically screening exam initiated at 7:16 AM.  Appropriate orders placed.  Petsch was informed that the remainder of the evaluation will be completed by another provider, this initial triage assessment does not replace that evaluation, and the importance of remaining in the ED until their evaluation is complete.  SOB, cough  Severely HTN, plan on labs and imaging   Sherel Fennell A, PA-C 06/18/21 0718    06/20/21, DO 06/18/21 [phone]

## 2021-06-18 NOTE — Progress Notes (Signed)
Family medicine teaching service will be admitting this patient. Our pager information can be located in the physician sticky notes, treatment team sticky notes, and the headers of all our official daily progress notes.   FAMILY MEDICINE TEACHING SERVICE Patient - Please contact intern pager [phone] or text page via website [email or web address] (login: [email or web address]) for questions regarding care. DO NOT page listed attending provider unless there is no answer from the number above.   Rodney Lampkins, MD PGY-2, [place] [facility] pager [phone]   

## 2021-06-19 ENCOUNTER — Encounter (HOSPITAL_COMMUNITY): Payer: Self-pay | Admitting: Student

## 2021-06-19 ENCOUNTER — Observation Stay (HOSPITAL_COMMUNITY): Payer: 59

## 2021-06-19 ENCOUNTER — Other Ambulatory Visit (HOSPITAL_COMMUNITY): Payer: Self-pay

## 2021-06-19 DIAGNOSIS — J441 Chronic obstructive pulmonary disease with (acute) exacerbation: Secondary | ICD-10-CM | POA: Diagnosis present

## 2021-06-19 DIAGNOSIS — N179 Acute kidney failure, unspecified: Secondary | ICD-10-CM | POA: Diagnosis present

## 2021-06-19 DIAGNOSIS — R0609 Other forms of dyspnea: Secondary | ICD-10-CM | POA: Diagnosis not present

## 2021-06-19 DIAGNOSIS — I5041 Acute combined systolic (congestive) and diastolic (congestive) heart failure: Secondary | ICD-10-CM

## 2021-06-19 DIAGNOSIS — I517 Cardiomegaly: Secondary | ICD-10-CM | POA: Diagnosis not present

## 2021-06-19 DIAGNOSIS — N1831 Chronic kidney disease, stage 3a: Secondary | ICD-10-CM | POA: Diagnosis not present

## 2021-06-19 DIAGNOSIS — K219 Gastro-esophageal reflux disease without esophagitis: Secondary | ICD-10-CM | POA: Diagnosis present

## 2021-06-19 DIAGNOSIS — Z20822 Contact with and (suspected) exposure to covid-19: Secondary | ICD-10-CM | POA: Diagnosis present

## 2021-06-19 DIAGNOSIS — I110 Hypertensive heart disease with heart failure: Secondary | ICD-10-CM | POA: Diagnosis present

## 2021-06-19 DIAGNOSIS — I5043 Acute on chronic combined systolic (congestive) and diastolic (congestive) heart failure: Secondary | ICD-10-CM | POA: Diagnosis present

## 2021-06-19 DIAGNOSIS — E785 Hyperlipidemia, unspecified: Secondary | ICD-10-CM | POA: Diagnosis present

## 2021-06-19 DIAGNOSIS — Z79899 Other long term (current) drug therapy: Secondary | ICD-10-CM | POA: Diagnosis not present

## 2021-06-19 DIAGNOSIS — Z887 Allergy status to serum and vaccine status: Secondary | ICD-10-CM | POA: Diagnosis not present

## 2021-06-19 DIAGNOSIS — Z7984 Long term (current) use of oral hypoglycemic drugs: Secondary | ICD-10-CM | POA: Diagnosis not present

## 2021-06-19 DIAGNOSIS — I10 Essential (primary) hypertension: Secondary | ICD-10-CM | POA: Diagnosis not present

## 2021-06-19 DIAGNOSIS — R059 Cough, unspecified: Secondary | ICD-10-CM | POA: Diagnosis present

## 2021-06-19 DIAGNOSIS — R319 Hematuria, unspecified: Secondary | ICD-10-CM | POA: Diagnosis present

## 2021-06-19 DIAGNOSIS — F458 Other somatoform disorders: Secondary | ICD-10-CM | POA: Diagnosis present

## 2021-06-19 DIAGNOSIS — R000 Tachycardia, unspecified: Secondary | ICD-10-CM | POA: Diagnosis present

## 2021-06-19 DIAGNOSIS — Z87891 Personal history of nicotine dependence: Secondary | ICD-10-CM | POA: Diagnosis not present

## 2021-06-19 DIAGNOSIS — T502X5A Adverse effect of carbonic-anhydrase inhibitors, benzothiadiazides and other diuretics, initial encounter: Secondary | ICD-10-CM | POA: Diagnosis present

## 2021-06-19 DIAGNOSIS — I160 Hypertensive urgency: Secondary | ICD-10-CM | POA: Diagnosis present

## 2021-06-19 LAB — CBC
HCT: 38.3 % — ABNORMAL LOW (ref 39.0–52.0)
Hemoglobin: 12.6 g/dL — ABNORMAL LOW (ref 13.0–17.0)
MCH: 28.4 pg (ref 26.0–34.0)
MCHC: 32.9 g/dL (ref 30.0–36.0)
MCV: 86.5 fL (ref 80.0–100.0)
Platelets: 233 10*3/uL (ref 150–400)
RBC: 4.43 MIL/uL (ref 4.22–5.81)
RDW: 13.3 % (ref 11.5–15.5)
WBC: 6.4 10*3/uL (ref 4.0–10.5)
nRBC: 0 % (ref 0.0–0.2)

## 2021-06-19 LAB — ECHOCARDIOGRAM COMPLETE
AR max vel: 2.04 cm2
AV Peak grad: 6.8 mmHg
Ao pk vel: 1.3 m/s
Area-P 1/2: 5.5 cm2
Calc EF: 32.4 %
Height: 70 in
MV M vel: 5.19 m/s
MV Peak grad: 107.5 mmHg
S' Lateral: 4.7 cm
Single Plane A2C EF: 30.1 %
Single Plane A4C EF: 34.5 %
Weight: 3040 oz

## 2021-06-19 LAB — BASIC METABOLIC PANEL
Anion gap: 5 (ref 5–15)
BUN: 26 mg/dL — ABNORMAL HIGH (ref 8–23)
CO2: 19 mmol/L — ABNORMAL LOW (ref 22–32)
Calcium: 7.6 mg/dL — ABNORMAL LOW (ref 8.9–10.3)
Chloride: 115 mmol/L — ABNORMAL HIGH (ref 98–111)
Creatinine, Ser: 1.74 mg/dL — ABNORMAL HIGH (ref 0.61–1.24)
GFR, Estimated: 44 mL/min — ABNORMAL LOW (ref >60)
Glucose, Bld: 152 mg/dL — ABNORMAL HIGH (ref 70–99)
Potassium: 4 mmol/L (ref 3.5–5.1)
Sodium: 139 mmol/L (ref 135–145)

## 2021-06-19 LAB — HIV ANTIBODY (ROUTINE TESTING W REFLEX): HIV Screen 4th Generation wRfx: NONREACTIVE

## 2021-06-19 LAB — CBG MONITORING, ED
Glucose-Capillary: 157 mg/dL — ABNORMAL HIGH (ref 70–99)
Glucose-Capillary: 175 mg/dL — ABNORMAL HIGH (ref 70–99)
Glucose-Capillary: 248 mg/dL — ABNORMAL HIGH (ref 70–99)

## 2021-06-19 LAB — GLUCOSE, CAPILLARY
Glucose-Capillary: 166 mg/dL — ABNORMAL HIGH (ref 70–99)
Glucose-Capillary: 148 mg/dL — ABNORMAL HIGH (ref 70–99)

## 2021-06-19 LAB — HEMOGLOBIN A1C
Hgb A1c MFr Bld: 6.9 % — ABNORMAL HIGH (ref 4.8–5.6)
Mean Plasma Glucose: 151.33 mg/dL

## 2021-06-19 MED ORDER — ISOSORB DINITRATE-HYDRALAZINE 20-37.5 MG PO TABS
1.0000 | ORAL_TABLET | Freq: Three times a day (TID) | ORAL | Status: DC
  Filled 2021-06-19 (×2): qty 1

## 2021-06-19 MED ORDER — HYDRALAZINE HCL 25 MG PO TABS
37.5000 mg | ORAL_TABLET | Freq: Three times a day (TID) | ORAL | Status: DC
  Administered 2021-06-19 – 2021-06-20 (×3): 37.5 mg via ORAL
  Filled 2021-06-19 (×3): qty 2

## 2021-06-19 MED ORDER — ISOSORBIDE MONONITRATE ER 30 MG PO TB24
15.0000 mg | ORAL_TABLET | Freq: Every day | ORAL | Status: DC
  Administered 2021-06-19 – 2021-06-20 (×2): 15 mg via ORAL
  Filled 2021-06-19 (×3): qty 1

## 2021-06-19 MED ORDER — CARVEDILOL 6.25 MG PO TABS
6.2500 mg | ORAL_TABLET | Freq: Two times a day (BID) | ORAL | Status: DC
  Administered 2021-06-19 – 2021-06-20 (×3): 6.25 mg via ORAL
  Filled 2021-06-19 (×2): qty 1
  Filled 2021-06-19: qty 2

## 2021-06-19 MED ORDER — PERFLUTREN LIPID MICROSPHERE
1.0000 mL | INTRAVENOUS | Status: AC | PRN
Start: 2021-06-19 — End: 2021-06-19
  Administered 2021-06-19: 2 mL via INTRAVENOUS
  Filled 2021-06-19: qty 10

## 2021-06-19 MED ORDER — FUROSEMIDE 20 MG PO TABS
40.0000 mg | ORAL_TABLET | Freq: Once | ORAL | Status: AC
Start: 2021-06-19 — End: 2021-06-19
  Administered 2021-06-19: 40 mg via ORAL
  Filled 2021-06-19: qty 2

## 2021-06-19 NOTE — Evaluation (Signed)
Physical Therapy Evaluation and Discharge Patient Details Name: Rodney Schultz MRN: [id number] DOB: [birth year] Today's Date: 06/19/2021  History of Present Illness  Pt is a 60 y/o male admitted 11/30 secondary to worsening SOB. Thought to be secondary to acute CHF vs COPD. PMH includes HTN, DM, and smoker, but recently quit.  Clinical Impression  Patient evaluated by Physical Therapy with no further acute PT needs identified. All education has been completed and the patient has no further questions. Pt overall at an independent level and steady when performing DGI tasks. Mild SOB noted, but did not seem to limit mobility tasks. Educated about activity pacing at home. See below for any follow-up Physical Therapy or equipment needs. PT is signing off. Thank you for this referral. If needs change, please re-consult.         Recommendations for follow up therapy are one component of a multi-disciplinary discharge planning process, led by the attending physician.  Recommendations may be updated based on patient status, additional functional criteria and insurance authorization.  Follow Up Recommendations No PT follow up    Assistance Recommended at Discharge None  Functional Status Assessment Patient has had a recent decline in their functional status and demonstrates the ability to make significant improvements in function in a reasonable and predictable amount of time.  Equipment Recommendations  None recommended by PT    Recommendations for Other Services       Precautions / Restrictions Precautions Precautions: None Restrictions Weight Bearing Restrictions: No      Mobility  Bed Mobility Overal bed mobility: Independent                  Transfers Overall transfer level: Independent                      Ambulation/Gait Ambulation/Gait assistance: Independent Gait Distance (Feet): 125 Feet Assistive device: None Gait Pattern/deviations: WFL(Within Functional  Limits) Gait velocity: WFL     General Gait Details: Overall steady and able to perform DGI tasks without LOB. Mild SOB noted, but did not limit mobility. Educated about pacing at home oxygen sats 94-100% on RA.  Stairs            Wheelchair Mobility    Modified Rankin (Stroke Patients Only)       Balance Overall balance assessment: Independent                               Standardized Balance Assessment Standardized Balance Assessment : Dynamic Gait Index   Dynamic Gait Index Level Surface: Normal Change in Gait Speed: Normal Gait with Horizontal Head Turns: Normal Gait with Vertical Head Turns: Normal Gait and Pivot Turn: Normal Step Over Obstacle: Normal Step Around Obstacles: Normal       Pertinent Vitals/Pain Pain Assessment: No/denies pain    Home Living Family/patient expects to be discharged to:: Private residence Living Arrangements: Spouse/significant other Available Help at Discharge: Family;Available 24 hours/day Type of Home: House Home Access: Stairs to enter Entrance Stairs-Rails: Right;Left;Can reach both Entrance Stairs-Number of Steps: 3   Home Layout: One level Home Equipment: None      Prior Function Prior Level of Function : Independent/Modified Independent                     Hand Dominance        Extremity/Trunk Assessment   Upper Extremity Assessment Upper Extremity Assessment: Overall [facility]  for tasks assessed    Lower Extremity Assessment Lower Extremity Assessment: Overall WFL for tasks assessed    Cervical / Trunk Assessment Cervical / Trunk Assessment: Normal  Communication   Communication: No difficulties  Cognition Arousal/Alertness: Awake/alert Behavior During Therapy: WFL for tasks assessed/performed Overall Cognitive Status: Within Functional Limits for tasks assessed                                          General Comments General comments (skin integrity, edema,  etc.): Educated about activity pacing at home    Exercises     Assessment/Plan    PT Assessment Patient does not need any further PT services  PT Problem List         PT Treatment Interventions      PT Goals (Current goals can be found in the Care Plan section)  Acute Rehab PT Goals Patient Stated Goal: to go home PT Goal Formulation: With patient Time For Goal Achievement: 06/19/21 Potential to Achieve Goals: Good    Frequency     Barriers to discharge        Co-evaluation               AM-PAC PT "6 Clicks" Mobility  Outcome Measure Help needed turning from your back to your side while in a flat bed without using bedrails?: None Help needed moving from lying on your back to sitting on the side of a flat bed without using bedrails?: None Help needed moving to and from a bed to a chair (including a wheelchair)?: None Help needed standing up from a chair using your arms (e.g., wheelchair or bedside chair)?: None Help needed to walk in hospital room?: None Help needed climbing 3-5 steps with a railing? : None 6 Click Score: 24    End of Session   Activity Tolerance: Patient tolerated treatment well Patient left: in bed;with call bell/phone within reach (on stretcher in ED) Nurse Communication: Mobility status PT Visit Diagnosis: Other abnormalities of gait and mobility (R26.89)    Time: 9622-2979 PT Time Calculation (min) (ACUTE ONLY): 12 min   Charges:   PT Evaluation $PT Eval Low Complexity: 1 Low          Cindee Salt, DPT  Acute Rehabilitation Services  Pager: [phone] Office: [phone]   Rodney Schultz 06/19/2021, 9:51 AM

## 2021-06-19 NOTE — TOC Benefit Eligibility Note (Signed)
Patient Product/process development scientist completed.    The patient is currently admitted and upon discharge could be taking Bidil.  Non Formulary  The patient is insured through [facility] Medicare Part D     Roland Earl, CPhT Pharmacy Patient Advocate Specialist [facility] Patient Advocate Team Direct Number: [phone]  Fax: [phone]

## 2021-06-19 NOTE — Progress Notes (Signed)
Progress Note  Patient Name: Rodney Schultz Date of Encounter: 06/19/2021  [facility] Cardiologist: None Rodney Schultz- new  Subjective   Feels better today.  Breathing better.  Inpatient Medications    Scheduled Meds:  amLODipine  10 mg Oral Daily   atorvastatin  20 mg Oral Daily   carvedilol  6.25 mg Oral BID WC   enoxaparin (LOVENOX) injection  40 mg Subcutaneous Q24H   insulin aspart  0-6 Units Subcutaneous TID WC   Continuous Infusions:  PRN Meds: perflutren lipid microspheres (DEFINITY) IV suspension   Vital Signs    Vitals:   06/19/21 0845 06/19/21 0900 06/19/21 0930 06/19/21 1200  BP: (!) 155/87 (!) 138/98 (!) 151/105 (!) 151/101  Pulse: 61 61 81 (!) 55  Resp: (!) 34 (!) 29 19 (!) 27  Temp:      TempSrc:      SpO2: 98% 97% 100% 99%  Weight:      Height:        Intake/Output Summary (Last 24 hours) at 06/19/2021 1211 Last data filed at 06/19/2021 0127 Gross per 24 hour  Intake --  Output 850 ml  Net -850 ml   Last 3 Weights 06/18/2021 03/27/2021 10/22/2020  Weight (lbs) 190 lb 187 lb 6.4 oz 180 lb 6.4 oz  Weight (kg) 86.183 kg 85.004 kg 81.829 kg      Telemetry    Normal sinus rhythm- Personally Reviewed  ECG    Sinus tachycardia, LVH- Personally Reviewed  Physical Exam   GEN: No acute distress.   Neck: No JVD Cardiac: RRR, no murmurs, rubs, or gallops.  Respiratory: Clear to auscultation bilaterally. GI: Soft, nontender, non-distended  MS: No edema; No deformity. Neuro:  Nonfocal  Psych: Normal affect   Labs    High Sensitivity Troponin:   Recent Labs  Lab 06/18/21 0717 06/18/21 0936  TROPONINIHS 48* 49*     Chemistry Recent Labs  Lab 06/18/21 0717 06/19/21 0122  NA 137 139  K 4.0 4.0  CL 109 115*  CO2 20* 19*  GLUCOSE 210* 152*  BUN 23 26*  CREATININE 1.96* 1.74*  CALCIUM 8.9 7.6*  GFRNONAA 38* 44*  ANIONGAP 8 5    Lipids No results for input(s): CHOL, TRIG, HDL, LABVLDL, LDLCALC, CHOLHDL in the last 168 hours.   Hematology Recent Labs  Lab 06/18/21 0717 06/19/21 0122  WBC 5.6 6.4  RBC 4.74 4.43  HGB 13.5 12.6*  HCT 40.5 38.3*  MCV 85.4 86.5  MCH 28.5 28.4  MCHC 33.3 32.9  RDW 13.2 13.3  PLT 278 233   Thyroid No results for input(s): TSH, FREET4 in the last 168 hours.  BNP Recent Labs  Lab 06/18/21 0717  BNP 436.2*    DDimer No results for input(s): DDIMER in the last 168 hours.   Radiology    DG Chest 2 View  Result Date: 06/18/2021 CLINICAL DATA:  Shortness of breath. EXAM: CHEST - 2 VIEW COMPARISON:  None. FINDINGS: Cardiopericardial silhouette is at upper limits of normal for size. Hazy parahilar opacities identified in both lungs, potentially related to pulmonary edema although infectious/inflammatory etiology not excluded. No pleural effusion or pneumothorax. The visualized bony structures of the thorax show no acute abnormality. IMPRESSION: Hazy bilateral parahilar lung opacity. Features may be related to pulmonary edema although infectious/inflammatory etiology not excluded. Electronically Signed   By: Kennith Center M.D.   On: 06/18/2021 07:33    Cardiac Studies   Echo images personally reviewed: Decreased LV systolic function globally,  await final read  Patient Profile     60 y.o. male new onset systolic heart failure  Assessment & Plan    Acute systolic heart failure: We will need to titrate medical therapy to help improve LV function.  We will also need to consider left and right heart cath to rule out an ischemic etiology.  He has only had a negative stress test in the past.  Based on renal function, may need to start with hydralazine and nitrates.  We will try to add Entresto if renal function improves.  May be beneficial to hold amlodipine as blood pressure comes down and uptitrate heart failure meds such as carvedilol and BiDil.     For questions or updates, please contact [facility] Please consult [email or web address] for contact info under         Signed, Lance Muss, MD  06/19/2021, 12:11 PM

## 2021-06-19 NOTE — Progress Notes (Signed)
OT Cancellation Note  Patient Details Name: Rodney Schultz MRN: [id number] DOB: [birth year]   Cancelled Treatment:    Reason Eval/Treat Not Completed: OT screened, no needs identified, will sign off.  Spoke with Pt and pt is independent.   Eber Jones., OTR/L Acute Rehabilitation Services Pager [phone] Office [phone]   Jeani Hawking M 06/19/2021, 10:12 AM

## 2021-06-19 NOTE — Care Management Obs Status (Signed)
MEDICARE OBSERVATION STATUS NOTIFICATION   Patient Details  Name: PER BEAGLEY MRN: [id number] Date of Birth: Jul 28, 1960   Medicare Observation Status Notification Given:  Yes  Given verbally over the phone due to remote, patient verbally agreed to sign  Lockie Pares, RN 06/19/2021, 10:46 AM

## 2021-06-19 NOTE — Discharge Instructions (Addendum)
Dear Rodney Schultz,  Thank you for letting us participate in your care. You were hospitalized for shortness of breath and diagnosed with Heart failure (HCC). You were treated with a fluid pill called Lasix (furosemide). You will be following up with our cardiologists next Thursday.    POST-HOSPITAL & CARE INSTRUCTIONS It is very important that you weigh yourself daily. Your "dry weight" is 178lbs. (You may be asked for this number by your doctors). If you weigh yourself daily and notice that you gain 3 pounds or more overnight, please take the medicine we are sending you home with (Lasix) and call your cardiologist's office: [phone].  Go to your follow up appointments (listed below)   DOCTOR'S APPOINTMENT   Future Appointments  Date Time Provider Department Center  06/26/2021  2:00 PM [facility] None    Follow-up Information     [facility]. Go to.   Specialty: Cardiology Why: Thursday, Dec 8 @ 2pm for HV TOC appt within [facility]. Bring all medications with you. FREE valet parking at [employer], off [address]. Contact information: [address] [id number] mc [place] [address] [phone]                Take care and be well!  Family Medicine Teaching Service Inpatient Team [place]  [facility]  [address] [place], [place] [address] [phone]    Medicare Outpatient Observation Notice   Patient name:  Rodney Schultz Patient number:  [id number]                                                                                                                                                                       You're a hospital outpatient receiving observation services. You are not an inpatient because:    Shortness of breath   You require hospital care for evaluation and/or treatment.  It is expected you will need hospital  care for less than a total of two days.  Being an outpatient may affect what you pay in a hospital:   When you're a hospital outpatient, your observation stay is covered under Medicare Part B.   For Part B services, you generally pay:   A copayment for each outpatient hospital service you get. Part B copayments may vary by type of service.   20% of the Medicare-approved amount for most doctor services, after the Part B deductible.   Observation services may affect coverage and payment of your care after you leave the hospital:     If you need skilled nursing facility (SNF) care after you leave the hospital, Medicare Part A will only cover SNF care if you've had a 3-day minimum, medically necessary, inpatient hospital stay for a related illness or injury. An inpatient hospital stay begins the day the hospital admits you as an inpatient based on a doctor's order and doesn't include the day you're discharged.   If you have Medicaid, a Medicare Advantage plan or other health plan, Medicaid or the plan may have different rules for SNF coverage after you leave the hospital. Check with Medicaid or your plan.   NOTE: Medicare Part A generally doesn't cover outpatient hospital services, like an observation stay. However, Part A will generally cover medically necessary inpatient services if the hospital admits you as an inpatient based on a doctor's order. In most cases, you'll pay a one-time deductible for all of your inpatient hospital services for the first 60 days you're in a hospital.                                                                                                                                                                      If you have any questions about your observation services, ask the hospital staff member  giving you this notice or the doctor providing your hospital care. You can also ask to speak with someone from the hospital's utilization or discharge planning department.   You can also call 1-800-MEDICARE ([phone])255-7650).  TT[employer] users should call 3646075661.   Form CMS 40973-[id number]   Expiration 07/19/2021 OMB APPROVAL [id number]          Your costs for medications:     Generally, prescription and over-the-counter drugs, including "self-administered drugs," you get in a hospital outpatient setting (like an emergency department) aren't covered by Part B. "Self- administered drugs" are drugs you'd normally take on your own. For safety reasons, many hospitals don't allow you to take medications brought from home. If you have a Medicare prescription drug plan (Part D), your plan may help you pay for these drugs. You'll likely need to pay out-of- pocket for these drugs and submit a claim to your drug plan for a refund. Contact your drug plan for  more information.                                                                                                                                                                        If you're enrolled in a Medicare Advantage plan (like an HMO or PPO) or other Medicare health plan (Part C), your costs and coverage may be different. Check with your plan to find out about coverage for outpatient observation services.    If you're a Qualified Medicare Beneficiary through your state Medicaid program, you can't be billed for Part A or Part B deductibles, coinsurance, and copayments.                                                                                                                                                                      Additional Information (Optional):                                                                                                                                                                                 Please sign below to show you received and understand this notice.  Date: 06/19/21 / Time:10:45 AM   CMS does not discriminate in its programs and activities. To request this publication in alternative format, please call: 1-800-MEDICARE or email:[email or web address].   Form CMS 10611-[id number]   Expiration 07/19/2021 OMB APPROVAL [id number]

## 2021-06-19 NOTE — ED Notes (Signed)
Pt received breakfast tray 

## 2021-06-19 NOTE — Progress Notes (Signed)
SATURATION QUALIFICATIONS: (This note is used to comply with regulatory documentation for home oxygen)  Patient Saturations on Room Air at Rest = 100%  Patient Saturations on Room Air while Ambulating = 94%   Please briefly explain why patient needs home oxygen: Pt did not require supplemental oxygen to maintain adequate oxygen sats.   Farley Ly, PT, DPT  Acute Rehabilitation Services  Pager: [phone] Office: [phone]

## 2021-06-19 NOTE — Progress Notes (Signed)
Family Medicine Teaching Service Daily Progress Note Intern Pager: [phone]  Patient name: Rodney Schultz Medical record number: [id number] Date of birth: 08-07-1960 Age: 60 y.o. Gender: male  Primary Care Provider: Jac Canavan, PA-C Consultants: Cardiology Code Status: Full  Pt Overview and Major Events to Date:  11/30- admitted  Assessment and Plan: Rodney Schultz is a 60 yo male who presented with shortness of breath. PMH of T2DM, HTN, HLD, hematuria s/p orchiectomy, GERD.  Shortness of Breath CXR on admission showed pulmonary edema vs infection, though no other evidence of infection (afebrile, no white count, etc.). Responded very well to 20mg  IV Lasix yesterday. Patient reports UOP of 1.5L, though this is not reflected in the chart. Patient remains on room air. Feels much improved today, but remains with crackles on lung exam. Echocardiogram pending. Given response to diuresis along with elevated BNP noted on admission, symptoms appear most consistent with new onset CHF. Less concern for COPD at this time, though he is high risk given extensive smoking history. - Transition Lasix to PO - Follow-up echocardiogram - Can d/c azithromycin/prednisone - Hopeful for discharge home after echocardiogram  AKI, prerenal Cr 1.96>1.74. Baseline 1.3-1.5. - am BMP - Continue diuresis above   Hypertension BPs improved since restarting amlodipine and atenolol yesterday. 140s/100s. Per cardiology recommendation, will transition atenolol to carvedilol. Home regimen amlodipine 10mg  daily, atenolol 25mg  daily, chlorthalidone 25mg  daily, and valsartan 320mg  daily.  - Transition atenolol to carvedilol 6.25mg  BID - Continue to hold chlorthalidone and valsartan in setting of AKI  T2DM A1c 6.9%, random glucose 152. Home regimen glipizide 10mg  daily and metformin 500mg  BID.  - CBGs, vsSSI  HLD - Continue atorvastatin 20mg  daily  FEN/GI: Carb modified diet PPx: Lovenox Dispo:Home today.  Barriers include echocardiogram, cardiology clearance.   Subjective:  Rodney Schultz reports feeling quite well this morning. His SOB is much improved after Lasix yesterday. He feels up to going home after echocardiogram if not concerning.   Objective: Temp:  [98.4 F (36.9 C)-98.7 F (37.1 C)] 98.4 F (36.9 C) (11/30 1025) Pulse Rate:  [52-102] 78 (12/01 0443) Resp:  [0-35] 20 (12/01 0443) BP: (124-206)/(81-155) 143/92 (12/01 0443) SpO2:  [87 %-100 %] 100 % (12/01 0443) Weight:  [86.2 kg] 86.2 kg (11/30 ) Physical Exam: General: Comfortable appearing, sitting up in bed, NAD Cardiovascular: RRR, no murmur, nl peripheral pulses, remains with mild JVD 1 inch above the clavicular notch bilaterally Respiratory: bibasilar crackles, normal WOB on RA, speaking in full sentences Abdomen: Non-tender, non-distended, bowel sounds are normal Extremities: No LE edema  Laboratory: Recent Labs  Lab 06/18/21 0717 06/19/21 0122  WBC 5.6 6.4  HGB 13.5 12.6*  HCT 40.5 38.3*  PLT 278 233   Recent Labs  Lab 06/18/21 0717 06/19/21 0122  NA 137 139  K 4.0 4.0  CL 109 115*  CO2 20* 19*  BUN 23 26*  CREATININE 1.96* 1.74*  CALCIUM 8.9 7.6*  GLUCOSE 210* 152*    Imaging/Diagnostic Tests: Echocardiogram pending   02-03-2000, MD 06/19/2021, 6:36 AM PGY-1, [facility] FPTS Intern pager: [phone], text pages welcome

## 2021-06-19 NOTE — ED Notes (Signed)
Patient used urinal at bedside

## 2021-06-19 NOTE — Progress Notes (Signed)
Admission from the Ed by bed awake and alert. 

## 2021-06-20 ENCOUNTER — Other Ambulatory Visit (HOSPITAL_COMMUNITY): Payer: Self-pay

## 2021-06-20 ENCOUNTER — Encounter (HOSPITAL_COMMUNITY): Payer: Self-pay | Admitting: Student

## 2021-06-20 DIAGNOSIS — I517 Cardiomegaly: Secondary | ICD-10-CM | POA: Diagnosis not present

## 2021-06-20 DIAGNOSIS — I5041 Acute combined systolic (congestive) and diastolic (congestive) heart failure: Secondary | ICD-10-CM | POA: Diagnosis not present

## 2021-06-20 DIAGNOSIS — I10 Essential (primary) hypertension: Secondary | ICD-10-CM | POA: Diagnosis not present

## 2021-06-20 DIAGNOSIS — N1831 Chronic kidney disease, stage 3a: Secondary | ICD-10-CM | POA: Diagnosis not present

## 2021-06-20 LAB — LIPID PANEL
Cholesterol: 197 mg/dL (ref 0–200)
HDL: 27 mg/dL — ABNORMAL LOW (ref >40)
LDL Cholesterol: 139 mg/dL — ABNORMAL HIGH (ref 0–99)
Total CHOL/HDL Ratio: 7.3 RATIO
Triglycerides: 155 mg/dL — ABNORMAL HIGH (ref <150)
VLDL: 31 mg/dL (ref 0–40)

## 2021-06-20 LAB — BASIC METABOLIC PANEL
Anion gap: 7 (ref 5–15)
BUN: 32 mg/dL — ABNORMAL HIGH (ref 8–23)
CO2: 20 mmol/L — ABNORMAL LOW (ref 22–32)
Calcium: 8.6 mg/dL — ABNORMAL LOW (ref 8.9–10.3)
Chloride: 110 mmol/L (ref 98–111)
Creatinine, Ser: 2.14 mg/dL — ABNORMAL HIGH (ref 0.61–1.24)
GFR, Estimated: 34 mL/min — ABNORMAL LOW (ref >60)
Glucose, Bld: 120 mg/dL — ABNORMAL HIGH (ref 70–99)
Potassium: 3.5 mmol/L (ref 3.5–5.1)
Sodium: 137 mmol/L (ref 135–145)

## 2021-06-20 LAB — GLUCOSE, CAPILLARY
Glucose-Capillary: 134 mg/dL — ABNORMAL HIGH (ref 70–99)
Glucose-Capillary: 133 mg/dL — ABNORMAL HIGH (ref 70–99)

## 2021-06-20 MED ORDER — POTASSIUM CHLORIDE CRYS ER 20 MEQ PO TBCR
40.0000 meq | EXTENDED_RELEASE_TABLET | Freq: Once | ORAL | Status: AC
  Administered 2021-06-20: 40 meq via ORAL
  Filled 2021-06-20: qty 2

## 2021-06-20 MED ORDER — HYDRALAZINE HCL 50 MG PO TABS
50.0000 mg | ORAL_TABLET | Freq: Three times a day (TID) | ORAL | Status: DC
  Administered 2021-06-20: 50 mg via ORAL
  Filled 2021-06-20: qty 1

## 2021-06-20 MED ORDER — ISOSORBIDE MONONITRATE ER 30 MG PO TB24
30.0000 mg | ORAL_TABLET | Freq: Every day | ORAL | 0 refills | Status: DC
  Filled 2021-06-20: qty 30, 30d supply, fill #0

## 2021-06-20 MED ORDER — ISOSORBIDE MONONITRATE ER 30 MG PO TB24: 30.0000 mg | ORAL_TABLET | Freq: Every day | ORAL | Status: DC

## 2021-06-20 MED ORDER — FUROSEMIDE 40 MG PO TABS
40.0000 mg | ORAL_TABLET | ORAL | 0 refills | Status: DC | PRN
  Filled 2021-06-20: qty 30, 30d supply, fill #0

## 2021-06-20 MED ORDER — CARVEDILOL 6.25 MG PO TABS
6.2500 mg | ORAL_TABLET | Freq: Two times a day (BID) | ORAL | 0 refills | Status: DC
  Filled 2021-06-20: qty 60, 30d supply, fill #0

## 2021-06-20 MED ORDER — HYDRALAZINE HCL 50 MG PO TABS
50.0000 mg | ORAL_TABLET | Freq: Three times a day (TID) | ORAL | 0 refills | Status: DC
  Filled 2021-06-20: qty 90, 30d supply, fill #0

## 2021-06-20 NOTE — Progress Notes (Addendum)
Family Medicine Teaching Service Daily Progress Note Intern Pager: [phone]  Patient name: Rodney Schultz Medical record number: [id number] Date of birth: 09-13-1960 Age: 60 y.o. Gender: male  Primary Care Provider: Jac Canavan, PA-C Consultants: Cardiology Code Status: [place]  Pt Overview and Major Events to Date:  11/30- admitted 12/1- Echo showing LVEF 20-25%  Assessment and Plan: Garald Rhew is sa 60yo male admitted for HFrEF exacerbation, this is a new diagnosis for him. PMH of T2DM, HTN, HLD, hematuria s/p orchiectomy, GERD.  HFpEF Echo obtained yesterday with LVEF 20-25%, global hypokinesis of LV, moderate LVH. Also reduced RV systolic function, moderately dilated LA, no valvular disease. Per cardiology note, seems they may want to get L and R heart cath. Pt has been NPO since midnight. Pt with excellent response to Lasix, 20mg  IV then 40mg  PO. K 3.5.  Seen by PT/OT both of whom signed off due to patient independence. Not currently a candidate for ACE/ARB/ARNI given AKI.  SOB much improved today, very mild crackles on my exam today, but improved from previous.  - Imdur 15mg  daily, hydralazine 37.5 q8h, Coreg 6.25mg  BID  - Will adjust pending cards recs - Tentative plan for  R+L heart cath - Further diuresis per cards - Will add PO KCl 40 mEq for K goal >4.0  AKI Cr bumped yesterday 1.74>2.14, likely related to diuretic use. Will decrease diuretic dose as above.   HTN BP overnight 140s/80-100s.  - Will adjust HF meds and antihypertensives as above  T2DM Glucose range 148-248. Received just 1 u SAI yesterday.  - Can d/c CBGs and SSI  HLD - Continue atorvastatin 20mg  daily  - Lipid panel today  FEN/GI: Carb modified PPx: Lovenox Dispo:Home tomorrow. Barriers include tentative plan for heart cath, titration of HF meds.   Subjective:  Mr. Williard reports that his SOB is much improved today  Objective: Temp:  [97.6 F (36.4 C)-98.3 F (36.8 C)] 97.9 F  (36.6 C) (12/02 0624) Pulse Rate:  [55-82] 60 (12/02 0336) Resp:  [0-34] 16 (12/02 0558) BP: (107-172)/(75-120) 147/89 (12/02 0558) SpO2:  [93 %-100 %] 99 % (12/02 0558) Weight:  [80.7 kg] 80.7 kg (12/02 0604) Physical Exam: General: Lying in bed comfortably, NAD, speaking in full sentences Cardiovascular: RRR, no murmur, no JVD today Respiratory: very mild bibasilar crackles, improved compared to previous Abdomen: Non-tender, non-distended Extremities: No LE edema  Laboratory: Recent Labs  Lab 06/18/21 0717 06/19/21 0122  WBC 5.6 6.4  HGB 13.5 12.6*  HCT 40.5 38.3*  PLT 278 233   Recent Labs  Lab 06/18/21 0717 06/19/21 0122 06/20/21 0118  NA 137 139 137  K 4.0 4.0 3.5  CL 109 115* 110  CO2 20* 19* 20*  BUN 23 26* 32*  CREATININE 1.96* 1.74* 2.14*  CALCIUM 8.9 7.6* 8.6*  GLUCOSE 210* 152* 120*     Imaging/Diagnostic Tests: Pending possible R/L heart cath  14/01/22, MD 06/20/2021, 6:38 AM PGY-1, [facility] FPTS Intern pager: [phone], text pages welcome

## 2021-06-20 NOTE — Progress Notes (Signed)
Progress Note  Patient Name: Rodney Schultz Date of Encounter: 06/20/2021  [facility] Cardiologist: None Arye Weyenberg-new  Subjective   No chest pain.  Breathing better after Lasix.  Inpatient Medications    Scheduled Meds:  atorvastatin  20 mg Oral Daily   carvedilol  6.25 mg Oral BID WC   enoxaparin (LOVENOX) injection  40 mg Subcutaneous Q24H   hydrALAZINE  50 mg Oral Q8H   [START ON 06/21/2021] isosorbide mononitrate  30 mg Oral Daily   Continuous Infusions:  PRN Meds:    Vital Signs    Vitals:   06/20/21 0715 06/20/21 0808 06/20/21 0942 06/20/21 1111  BP:  (!) 151/98 (!) 154/97 (!) 136/93  Pulse:  60 (!) 58 60  Resp:  17  10  Temp: 97.8 F (36.6 C) (!) 97.4 F (36.3 C)  (!) 97.5 F (36.4 C)  TempSrc: Oral Oral  Oral  SpO2:  94%  97%  Weight:      Height:        Intake/Output Summary (Last 24 hours) at 06/20/2021 1154 Last data filed at 06/20/2021 0600 Gross per 24 hour  Intake 240 ml  Output 1025 ml  Net -785 ml   Last 3 Weights 06/20/2021 06/18/2021 03/27/2021  Weight (lbs) 177 lb 14.4 oz 190 lb 187 lb 6.4 oz  Weight (kg) 80.695 kg 86.183 kg 85.004 kg      Telemetry    Sinus rhythm- Personally Reviewed  ECG      Physical Exam   GEN: No acute distress.   Neck: No JVD Cardiac: RRR, no murmurs, rubs, or gallops.  Respiratory: Clear to auscultation bilaterally. GI: Soft, nontender, non-distended  MS: No edema; No deformity. Neuro:  Nonfocal  Psych: Anxious affect   Labs    High Sensitivity Troponin:   Recent Labs  Lab 06/18/21 0717 06/18/21 0936  TROPONINIHS 48* 49*     Chemistry Recent Labs  Lab 06/18/21 0717 06/19/21 0122 06/20/21 0118  NA 137 139 137  K 4.0 4.0 3.5  CL 109 115* 110  CO2 20* 19* 20*  GLUCOSE 210* 152* 120*  BUN 23 26* 32*  CREATININE 1.96* 1.74* 2.14*  CALCIUM 8.9 7.6* 8.6*  GFRNONAA 38* 44* 34*  ANIONGAP 8 5 7     Lipids No results for input(s): CHOL, TRIG, HDL, LABVLDL, LDLCALC, CHOLHDL in the last  168 hours.  Hematology Recent Labs  Lab 06/18/21 0717 06/19/21 0122  WBC 5.6 6.4  RBC 4.74 4.43  HGB 13.5 12.6*  HCT 40.5 38.3*  MCV 85.4 86.5  MCH 28.5 28.4  MCHC 33.3 32.9  RDW 13.2 13.3  PLT 278 233   Thyroid No results for input(s): TSH, FREET4 in the last 168 hours.  BNP Recent Labs  Lab 06/18/21 0717  BNP 436.2*    DDimer No results for input(s): DDIMER in the last 168 hours.   Radiology    ECHOCARDIOGRAM COMPLETE  Result Date: 06/19/2021    ECHOCARDIOGRAM REPORT   Patient Name:   Rodney Schultz Date of Exam: 06/19/2021 Medical Rec #:  [id number]       Height:       70.0 in Accession #:    [id number]      Weight:       190.0 lb Date of Birth:  1960/12/30       BSA:          2.042 m Patient Age:    60 years  BP:           139/86 mmHg Patient Gender: M               HR:           69 bpm. Exam Location:  Inpatient Procedure: 2D Echo, Color Doppler, Cardiac Doppler and Intracardiac            Opacification Agent Indications:    Dyspnea  History:        Patient has no prior history of Echocardiogram examinations.                 Abnormal ECG, Signs/Symptoms:Shortness of Breath; Risk                 Factors:Hypertension and Diabetes.  Sonographer:    Cleatis Polka Referring Phys: [id number] MARSHALL L CHAMBLISS IMPRESSIONS  1. Left ventricular ejection fraction, by estimation, is 20 to 25%. The left ventricle has severely decreased function. The left ventricle demonstrates global hypokinesis. The left ventricular internal cavity size was mildly dilated. There is moderate left ventricular hypertrophy. Left ventricular diastolic parameters are consistent with Grade III diastolic dysfunction (restrictive). The average left ventricular global longitudinal strain is -7.0 %. The global longitudinal strain is abnormal.  2. Right ventricular systolic function is severely reduced. The right ventricular size is normal.  3. Left atrial size was moderately dilated.  4. The mitral valve is normal in  structure. Moderate mitral valve regurgitation. No evidence of mitral stenosis.  5. The aortic valve is tricuspid. Aortic valve regurgitation is not visualized. No aortic stenosis is present.  6. The inferior vena cava is normal in size with greater than 50% respiratory variability, suggesting right atrial pressure of 3 mmHg. Comparison(s): No prior Echocardiogram. FINDINGS  Left Ventricle: Left ventricular ejection fraction, by estimation, is 20 to 25%. The left ventricle has severely decreased function. The left ventricle demonstrates global hypokinesis. Definity contrast agent was given IV to delineate the left ventricular endocardial borders. The average left ventricular global longitudinal strain is -7.0 %. The global longitudinal strain is abnormal. The left ventricular internal cavity size was mildly dilated. There is moderate left ventricular hypertrophy. Left ventricular diastolic parameters are consistent with Grade III diastolic dysfunction (restrictive). Right Ventricle: The right ventricular size is normal. Right ventricular systolic function is severely reduced. Left Atrium: Left atrial size was moderately dilated. Right Atrium: Right atrial size was normal in size. Pericardium: There is no evidence of pericardial effusion. Mitral Valve: The mitral valve is normal in structure. Moderate mitral valve regurgitation. No evidence of mitral valve stenosis. Tricuspid Valve: The tricuspid valve is normal in structure. Tricuspid valve regurgitation is mild . No evidence of tricuspid stenosis. Aortic Valve: The aortic valve is tricuspid. Aortic valve regurgitation is not visualized. No aortic stenosis is present. Aortic valve peak gradient measures 6.8 mmHg. Pulmonic Valve: The pulmonic valve was normal in structure. Pulmonic valve regurgitation is mild. No evidence of pulmonic stenosis. Aorta: The aortic root is normal in size and structure. Venous: The inferior vena cava is normal in size with greater than  50% respiratory variability, suggesting right atrial pressure of 3 mmHg. IAS/Shunts: No atrial level shunt detected by color flow Doppler.  LEFT VENTRICLE PLAX 2D LVIDd:         6.00 cm      Diastology LVIDs:         4.70 cm      LV e' medial:    3.33 cm/s LV PW:  1.40 cm      LV E/e' medial:  26.8 LV IVS:        1.20 cm      LV e' lateral:   5.52 cm/s LVOT diam:     2.10 cm      LV E/e' lateral: 16.2 LV SV:         39 LV SV Index:   19           2D Longitudinal Strain LVOT Area:     3.46 cm     2D Strain GLS Avg:     -7.0 %  LV Volumes (MOD) LV vol d, MOD A2C: 209.0 ml 3D Volume EF: LV vol d, MOD A4C: 226.0 ml 3D EF:        40 % LV vol s, MOD A2C: 146.0 ml LV EDV:       243 ml LV vol s, MOD A4C: 148.0 ml LV ESV:       146 ml LV SV MOD A2C:     63.0 ml  LV SV:        96 ml LV SV MOD A4C:     226.0 ml LV SV MOD BP:      71.6 ml RIGHT VENTRICLE            IVC RV Basal diam:  3.90 cm    IVC diam: 1.90 cm RV Mid diam:    3.10 cm RV S prime:     8.32 cm/s TAPSE (M-mode): 2.0 cm LEFT ATRIUM             Index        RIGHT ATRIUM           Index LA diam:        5.00 cm 2.45 cm/m   RA Area:     18.60 cm LA Vol (A2C):   95.3 ml 46.66 ml/m  RA Volume:   54.50 ml  26.69 ml/m LA Vol (A4C):   79.6 ml 38.98 ml/m LA Biplane Vol: 87.1 ml 42.65 ml/m  AORTIC VALVE AV Area (Vmax): 2.04 cm AV Vmax:        130.00 cm/s AV Peak Grad:   6.8 mmHg LVOT Vmax:      76.70 cm/s LVOT Vmean:     52.500 cm/s LVOT VTI:       0.113 m  AORTA Ao Root diam: 3.10 cm Ao Asc diam:  3.50 cm MITRAL VALVE               TRICUSPID VALVE MV Area (PHT): 5.50 cm    TR Peak grad:   31.6 mmHg MV Decel Time: 138 msec    TR Vmax:        281.00 cm/s MR Peak grad: 107.5 mmHg MR Mean grad: 75.0 mmHg    SHUNTS MR Vmax:      518.50 cm/s  Systemic VTI:  0.11 m MR Vmean:     416.5 cm/s   Systemic Diam: 2.10 cm MV E velocity: 89.30 cm/s MV A velocity: 27.70 cm/s MV E/A ratio:  3.22 Olga Millers MD Electronically signed by Olga Millers MD Signature  Date/Time: 06/19/2021/12:45:54 PM    Final     Cardiac Studies   Echo shows severely decreased LVEF  Patient Profile     60 y.o. male hypertension, cardiomyopathy  Assessment & Plan    Acute systolic/diastolic heart failure: Unclear etiology.  I suspect this is all hypertensive mediated as opposed to ischemic.  I  stressed the importance of tight blood pressure control.  He states that in the past, he thinks some blood pressure medicines have given him diabetes.  He mentioned HCTZ.  I think first step is to get him on a good heart failure regimen.  We are limited by his chronic renal insufficiency.  This has been getting worse.  I explained to him that this is likely a consequence of his poorly controlled hypertension.  He was under the impression that his blood pressure was well controlled at home; however, we are still seeing elevated readings in the hospital.  Increase Imdur to 30 mg daily.  Increase hydralazine to 50 mg 3 times daily.  Initially had ordered BiDil but this typically is an expensive brand-name medicine so we will have to break up the Imdur and hydralazine.  SGLT2 inhibitor would be reasonable as well for heart failure.  I do not think we can use an ACE inhibitor, ARB, ARNI given his renal insufficiency.  Continue carvedilol.  Heart rate has been in the 50s at times.  He has been on amlodipine as well.  Hopefully with better blood pressure control, his LVEF would improve.  We discussed left and right heart cath.  He would like to go home and do this as an outpatient.  His creatinine is more elevated today so I am not keen on giving him contrast dye at this point.  We need to see where his kidney function settles out.  He will likely need a right and left heart cath as an outpatient.  Will [facility] to [facility].  Hopefully this will help him with resources to aid in compliance and checking blood pressures regularly.     For questions or updates, please contact [place] Please consult [email or web address] for contact info under        Signed, Larae Grooms, MD  06/20/2021, 11:54 AM

## 2021-06-20 NOTE — Discharge Summary (Signed)
[facility] Discharge Summary  Patient name: Rodney Schultz Medical record number: [id number] Date of birth: 07-Jul-1961 Age: 60 y.o. Gender: male Date of Admission: 06/18/2021  Date of Discharge: 06/20/2021 Admitting Physician: Gerrit Heck, MD  Primary Care Provider: Carlena Hurl, PA-C Consultants: Cardiology  Indication for Hospitalization: Shortness of Breath  Discharge Diagnoses/Problem List:  Principal Problem:   Heart failure [facility])  Disposition: Home  Discharge Condition: Stable  Discharge Exam: From my same-day progress note: General: Lying in bed comfortably, NAD, speaking in full sentences Cardiovascular: RRR, no murmur, no JVD today Respiratory: very mild bibasilar crackles, improved compared to previous Abdomen: Non-tender, non-distended Extremities: No LE edema  Brief Hospital Course:  Rodney Schultz is a 60 y.o. male presenting with shortness of breath. PMH is significant for T2DM, HTN, HLD, Hematuria s/p orchiectomy, GERD.   Hospital course by problem listed below:  Shortness of breath CHF Exacerbation, Systolic and diastolic HF Patient was admitted for shortness of breath that concerning for new onset CHF. BNP elevated to 436 and troponin mildly elevated but trended flat. Chest x-ray showed hazy perihilar opacities and cardiac silhouette in upper limits of normal.  EKG showed mild sinus tachycardia at 102 bpm and significant LVH. Cardiology was consulted and recommended admission.  On examination patient had bilateral crackles in lower lung fields posteriorly and was saturating well on room air.  Had some mild increased work of breathing while in room.  Did not have any lower extremity edema and had mild JVD 1 inch above supraclavicular notch.  Was given 20 mg IV Lasix and demonstrated rapid response with improvement in respiratory status.  Echo showed LVEF 20-25%, global hypokinesis of the LV, and mild dilatation of the LV.  Moderate LVH also noted as well as Grade III diastolic dysfunction. RV systolic function also severely reduced. Moderate dilatation of LA. No valvular disease. Due to patient's renal impairment discussed below, he was not placed on an ACE/ARB/ARNI and was not started on daily diuretic, but was discharged with PRN Lasix and close follow-up in HF clinic with plans for outpatient L/R Heart Cath.   AKI, likely prerenal Creatinine 1.96 on admission.  Baseline around 1.3-1.5.  Creatinine initially improved with diuresis, but increased to 2.14 after two doses of Lasix. Patient's home chlorthalidone and valsartan were transitioned to hydralazine and isosorbide and his home atenolol was transitioned to carvedilol. Discharged with PRN Lasix as we feared his kidneys would not tolerate daily diuresis.   Chronic conditions of HTN, T2DM, HLD remained stable  Follow up recommendations: Recommend adding SGLT2 given [id number] and systolic heart failure. He would benefit from both cardiac and renal protection.  AKI: Recommend follow-up BMP to monitor renal function off nephrotoxic meds.    Significant Procedures: None, R/L heart cath deferred to outpatient   Significant Labs and Imaging:  Recent Labs  Lab 06/18/21 0717 06/19/21 0122  WBC 5.6 6.4  HGB 13.5 12.6*  HCT 40.5 38.3*  PLT 278 233   Recent Labs  Lab 06/18/21 0717 06/19/21 0122 06/20/21 0118  NA 137 139 137  K 4.0 4.0 3.5  CL 109 115* 110  CO2 20* 19* 20*  GLUCOSE 210* 152* 120*  BUN 23 26* 32*  CREATININE 1.96* 1.74* 2.14*  CALCIUM 8.9 7.6* 8.6*    Echocardiogram IMPRESSIONS     1. Left ventricular ejection fraction, by estimation, is 20 to 25%. The  left ventricle has severely decreased function. The left ventricle  demonstrates global hypokinesis. The  left ventricular internal cavity size  was mildly dilated. There is moderate  left ventricular hypertrophy. Left ventricular diastolic parameters are  consistent with Grade III  diastolic dysfunction (restrictive). The average  left ventricular global longitudinal strain is -7.0 %. The global  longitudinal strain is abnormal.   2. Right ventricular systolic function is severely reduced. The right  ventricular size is normal.   3. Left atrial size was moderately dilated.   4. The mitral valve is normal in structure. Moderate mitral valve  regurgitation. No evidence of mitral stenosis.   5. The aortic valve is tricuspid. Aortic valve regurgitation is not  visualized. No aortic stenosis is present.   6. The inferior vena cava is normal in size with greater than 50%  respiratory variability, suggesting right atrial pressure of 3 mmHg.   Comparison(s): No prior Echocardiogram.   FINDINGS   Left Ventricle: Left ventricular ejection fraction, by estimation, is 20  to 25%. The left ventricle has severely decreased function. The left  ventricle demonstrates global hypokinesis. Definity contrast agent was  given IV to delineate the left  ventricular endocardial borders. The average left ventricular global  longitudinal strain is -7.0 %. The global longitudinal strain is abnormal.  The left ventricular internal cavity size was mildly dilated. There is  moderate left ventricular hypertrophy.  Left ventricular diastolic parameters are consistent with Grade III  diastolic dysfunction (restrictive).   Right Ventricle: The right ventricular size is normal. Right ventricular  systolic function is severely reduced.   Left Atrium: Left atrial size was moderately dilated.   Right Atrium: Right atrial size was normal in size.   Pericardium: There is no evidence of pericardial effusion.   Mitral Valve: The mitral valve is normal in structure. Moderate mitral  valve regurgitation. No evidence of mitral valve stenosis.   Tricuspid Valve: The tricuspid valve is normal in structure. Tricuspid  valve regurgitation is mild . No evidence of tricuspid stenosis.   Aortic Valve:  The aortic valve is tricuspid. Aortic valve regurgitation is  not visualized. No aortic stenosis is present. Aortic valve peak gradient  measures 6.8 mmHg.   Pulmonic Valve: The pulmonic valve was normal in structure. Pulmonic valve  regurgitation is mild. No evidence of pulmonic stenosis.   Aorta: The aortic root is normal in size and structure.   Venous: The inferior vena cava is normal in size with greater than 50%  respiratory variability, suggesting right atrial pressure of 3 mmHg.   IAS/Shunts: No atrial level shunt detected by color flow Doppler.      LEFT VENTRICLE  PLAX 2D  LVIDd:         6.00 cm      Diastology  LVIDs:         4.70 cm      LV e' medial:    3.33 cm/s  LV PW:         1.40 cm      LV E/e' medial:  26.8  LV IVS:        1.20 cm      LV e' lateral:   5.52 cm/s  LVOT diam:     2.10 cm      LV E/e' lateral: 16.2  LV SV:         39  LV SV Index:   19           2D Longitudinal Strain  LVOT Area:     3.46 cm     2D Strain  GLS Avg:     -7.0 %     LV Volumes (MOD)  LV vol d, MOD A2C: 209.0 ml 3D Volume EF:  LV vol d, MOD A4C: 226.0 ml 3D EF:        40 %  LV vol s, MOD A2C: 146.0 ml LV EDV:       243 ml  LV vol s, MOD A4C: 148.0 ml LV ESV:       146 ml  LV SV MOD A2C:     63.0 ml  LV SV:        96 ml  LV SV MOD A4C:     226.0 ml  LV SV MOD BP:      71.6 ml   RIGHT VENTRICLE            IVC  RV Basal diam:  3.90 cm    IVC diam: 1.90 cm  RV Mid diam:    3.10 cm  RV S prime:     8.32 cm/s  TAPSE (M-mode): 2.0 cm   LEFT ATRIUM             Index        RIGHT ATRIUM           Index  LA diam:        5.00 cm 2.45 cm/m   RA Area:     18.60 cm  LA Vol (A2C):   95.3 ml 46.66 ml/m  RA Volume:   54.50 ml  26.69 ml/m  LA Vol (A4C):   79.6 ml 38.98 ml/m  LA Biplane Vol: 87.1 ml 42.65 ml/m   AORTIC VALVE  AV Area (Vmax): 2.04 cm  AV Vmax:        130.00 cm/s  AV Peak Grad:   6.8 mmHg  LVOT Vmax:      76.70 cm/s  LVOT Vmean:     52.500 cm/s  LVOT VTI:       0.113  m     AORTA  Ao Root diam: 3.10 cm  Ao Asc diam:  3.50 cm   MITRAL VALVE               TRICUSPID VALVE  MV Area (PHT): 5.50 cm    TR Peak grad:   31.6 mmHg  MV Decel Time: 138 msec    TR Vmax:        281.00 cm/s  MR Peak grad: 107.5 mmHg  MR Mean grad: 75.0 mmHg    SHUNTS  MR Vmax:      518.50 cm/s  Systemic VTI:  0.11 m  MR Vmean:     416.5 cm/s   Systemic Diam: 2.10 cm  MV E velocity: 89.30 cm/s  MV A velocity: 27.70 cm/s  MV E/A ratio:  3.22   Kirk Ruths MD  Electronically signed by Kirk Ruths MD  Signature Date/Time: 06/19/2021/12:45:54 PM   Results/Tests Pending at Time of Discharge: None  Discharge Medications:  Allergies as of 06/20/2021       Reactions   Influenza Vaccines    Sick- was put in hospital         Medication List     STOP taking these medications    amLODipine 10 MG tablet Commonly known as: NORVASC   atenolol 25 MG tablet Commonly known as: TENORMIN   chlorthalidone 25 MG tablet Commonly known as: HYGROTON   COLD/ALLERGY/SINUS PO   valsartan 320 MG tablet Commonly known as: DIOVAN  TAKE these medications    atorvastatin 20 MG tablet Commonly known as: Lipitor Take 1 tablet (20 mg total) by mouth daily.   carvedilol 6.25 MG tablet Commonly known as: COREG Take 1 tablet (6.25 mg total) by mouth 2 (two) times daily with a meal.   furosemide 40 MG tablet Commonly known as: Lasix Take 1 tablet (40 mg total) by mouth as needed. If you notice that you have gained 3 pounds or more in one day, please take one (1) tablet and call your cardiologist's office   glipiZIDE 10 MG tablet Commonly known as: GLUCOTROL Take 1 tablet (10 mg total) by mouth daily before breakfast.   hydrALAZINE 50 MG tablet Commonly known as: APRESOLINE Take 1 tablet (50 mg total) by mouth every 8 (eight) hours.   isosorbide mononitrate 30 MG 24 hr tablet Commonly known as: IMDUR Take 1 tablet (30 mg total) by mouth daily. Start taking on:  June 21, 2021   metFORMIN 500 MG 24 hr tablet Commonly known as: GLUCOPHAGE-XR Take 1 tablet (500 mg total) by mouth 2 (two) times daily.   MUSCLE RUB EX Apply 1 application topically 2 (two) times daily as needed (leg pain).        Discharge Instructions: Please refer to Patient Instructions section of EMR for full details.  Patient was counseled important signs and symptoms that should prompt return to medical care, changes in medications, dietary instructions, activity restrictions, and follow up appointments.   Follow-Up Appointments:  Follow-up Information     [facility]. Go to.   Specialty: Cardiology Why: Thursday, Dec 8 @ 2pm for HV TOC appt within [place]. Bring all medications with you. FREE valet parking at [employer], off [address]. Contact information: [address] [id number] [place]                Eppie Gibson, MD 06/20/2021, 2:42 PM PGY-1, [place]

## 2021-06-20 NOTE — Progress Notes (Addendum)
Patient provided with verbal discharge instructions. Paper copy of discharge provided to patient. RN answered all questions. VSS at discharge. IV removed. Patient belongings sent with patient. Patient dc'd via wheelchair through [employer] to private vehicle. TOC meds brought to bedside.

## 2021-06-20 NOTE — Plan of Care (Signed)
Problem: Education: Goal: Knowledge of General Education information will improve Description Including pain rating scale, medication(s)/side effects and non-pharmacologic comfort measures Outcome: Progressing   Problem: Health Behavior/Discharge Planning: Goal: Ability to manage health-related needs will improve Outcome: Progressing   Problem: Clinical Measurements: Goal: Will remain free from infection Outcome: Progressing   Problem: Nutrition: Goal: Adequate nutrition will be maintained Outcome: Progressing   Problem: Coping: Goal: Level of anxiety will decrease Outcome: Progressing   Problem: Pain Managment: Goal: General experience of comfort will improve Outcome: Progressing   Problem: Safety: Goal: Ability to remain free from injury will improve Outcome: Progressing   Problem: Skin Integrity: Goal: Risk for impaired skin integrity will decrease Outcome: Progressing   

## 2021-06-20 NOTE — Progress Notes (Signed)
Heart Failure Stewardship Pharmacist Progress Note   PCP: Carlena Hurl, PA-C PCP-Cardiologist: None    HPI:  60 yo M with PMH of T2DM, HTN, HLD, GERD. He presented to the ED on 11/30 with shortness of breath. CXR concerning for pulmonary edema. ECHO done on 12/1 with severely reduced LVEF 20-25%, G3DD, and severely reduced RV. R/LHC  delayed with renal dysfunction.  Current HF Medications: Beta Blocker: carvedilol 6.25 mg BID Other: hydralazine 50 mg TID, Imdur 30 mg daily  Prior to admission HF Medications: ACE/ARB/ARNI: valsartan 320 mg daily  Pertinent Lab Values: Serum creatinine 1.74>2.14, BUN 32, Potassium 3.5, Sodium 137, BNP 436.2, A1c 6.9   Vital Signs: Weight: 177 lbs (admission weight: 190 lbs) Blood pressure: 150/90s  Heart rate: 50-60s  I/O: -900 mL yesterday; net -1.9L  Medication Assistance / Insurance Benefits Check: Does the patient have prescription insurance?  Yes Type of insurance plan: UHC - Pharmacist, community  Does the patient qualify for medication assistance through manufacturers or grants?   Pending  Outpatient Pharmacy:  [facility] Is the patient willing to use [facility] pharmacy at discharge? Yes Is the patient willing to transition their outpatient pharmacy to utilize a [facility] outpatient pharmacy?   Yes    Assessment: 1. Acute systolic CHF (EF 82-57%), due to presumed NICM. NYHA class II symptoms. - Continue carvedilol 6.25 mg BID - Continue Imdur 30 mg daily and hydralazine 50 mg TID - Consider adding Entresto, spironolactone, and Farxiga once renal function allows    Plan: 1) Medication changes recommended at this time: - None, discharge today  2) Patient assistance: - >$2,000 left on deductible for prescriptions - Unclear what monthly copays will be after this is met - Copay cards (monthly for [employer]) can be used to lower cost for [employer], Farxiga/Jardiance  3)   Education  - To be completed prior to discharge  Kerby Nora, PharmD, BCPS Heart Failure Cytogeneticist Phone [phone]

## 2021-06-20 NOTE — Progress Notes (Signed)
Heart Failure Nurse Navigator Progress Note  PCP: Jac Canavan, PA-C PCP-Cardiologist: Catalina Gravel., MD (NEW) Admission Diagnosis: COPD/CHF exac Admitted from: home alone  Presentation:   Rodney Schultz presented 11/30 with increased SOB. Pt resting in bed on room air. Interactive with interview process. Pt states he drives, does not partake in illicit drugs or alcohol. Smoked from age [age], quit, then started again in 2020 after getting out of prison. Smokes about 1 pack of cigarettes per week.  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 Eats two bologna sandwiches for lunch daily, educated on sodium modifications. Drinks 6-8 bottles of water during work, educated on fluid modifications.  Pt drives and has reliable vehicle. Pt states he was on food stamps initially after prison release, but has not had assistance for some time. Has food at home, but has "made sacrifices this year".   ECHO/ LVEF: 20-25%, G3DD  Clinical Course:  Past Medical History:  Diagnosis Date   Diabetes mellitus without complication (HCC)    GERD (gastroesophageal reflux disease)    Hypertension      Social History   Socioeconomic History   Marital status: Divorced    Spouse name: Not on file   Number of children: 4   Years of education: Not on file   Highest education level: 11th grade  Occupational History   Occupation: Orthoptist yard    Comment: operates forklift, Holiday representative "yard dog"  Tobacco Use   Smoking status: Former    Packs/day: 0.10    Years: 35.00    Pack years: 3.50    Types: Cigarettes    Quit date: 06/17/2021   Smokeless tobacco: Never   Tobacco comments:    Last use 06/17/21. Pt smoked a pack a week  Vaping Use   Vaping Use: Never used  Substance and Sexual Activity   Alcohol use: Not Currently   Drug use: Never   Sexual activity: Not on file  Other Topics Concern   Not on file  Social History Narrative   Not on file    Social Determinants of Health   Financial Resource Strain: High Risk   Difficulty of Paying Living Expenses: Hard  Food Insecurity: Food Insecurity Present   Worried About Running Out of Food in the Last Year: Never true   Ran Out of Food in the Last Year: Sometimes true  Transportation Needs: No Transportation Needs   Lack of Transportation (Medical): No   Lack of Transportation (Non-Medical): No  Physical Activity: Not on file  Stress: Not on file  Social Connections: Not on file    High Risk Criteria for Readmission and/or Poor Patient Outcomes: Heart failure hospital admissions (last 6 months): 1  No Show rate: 0% Difficult social situation: YES Demonstrates medication adherence: Questionable Primary Language: English Literacy level: able to read/write and comprehend  Education Assessment and Provision:  Detailed education and instructions provided on heart failure disease management including the following:  Signs and symptoms of Heart Failure When to call the physician Importance of daily weights Low sodium diet Fluid restriction Medication management Anticipated future follow-up appointments  Patient education given on each of the above topics.  Patient acknowledges understanding via teach back method and acceptance of all instructions.  Education Materials:  "Living Better With Heart Failure" Booklet, HF zone tool, & Daily Weight Tracker Tool.  Patient has scale at home: yes Patient has pill box at home: no, will give from  AHF clinic.    Barriers of Care:   -new Dx -has experience food insecurities in the past 12 months.  -medication cost/financial strain -medication compliance? -fluid/sodium compliance  Considerations/Referrals:   Referral made to Heart Failure Pharmacist Stewardship: yes, appreciated Referral made to Heart Failure CSW/NCM TOC: yes, to see at [place] Referral made to Heart & Vascular TOC clinic: yes, 12/8 @ 2pm  Items for Follow-up  on DC/TOC: -optimize -cont. HF education -diet/fluid modification -financial strain/med cost -food resources -med compliance?  Pricilla Holm, MSN, RN Heart Failure Nurse Navigator [phone]

## 2021-06-20 NOTE — Progress Notes (Signed)
FPTS Brief Progress Note  S: Went to see patient for evening rounds. He was sleeping comfortably, all vitals stable. I did not wake him. Spoke with primary RN who did not voice any concerns. Patient reportedly told her he wants to leave in the morning and that he's unhappy with portion sizes of food. Otherwise no complaints.  O: BP 126/89 (BP Location: Right Arm)   Pulse (!) 56   Temp 97.8 F (36.6 C) (Oral)   Resp 16   Ht 5\' 10"  (1.778 m)   Wt 86.2 kg   SpO2 97%   BMI 27.26 kg/m   General: NAD, sleeping comfortably Respiratory: No respiratory distress, equal chest rise noted  A/P: Rodney Schultz is a 60 y/o male who presented with shortness of breath and was found to have acute systolic and diastolic heart failure, also w/severely reduced RV function on echo. He remains stable at this time and has improved clinically since admission after treatment w/diuresis.  New Onset CHF Breathing comfortably with SpO2 >90% on room air. 700 mL UOP over past 24h. -Tentative plan for right and left heart cath per cardiology -NPO for now (started at midnight) -Continue plan per daily progress note and per cardiology team -Orders reviewed. Labs for AM ordered, which was adjusted as needed.   Remainder of problems stable at this time. Plan per day team.   62, MD 06/20/2021, 12:26 AM PGY-2, [facility] Night Resident  Please page [phone] with questions.

## 2021-06-26 ENCOUNTER — Other Ambulatory Visit: Payer: Self-pay

## 2021-06-26 ENCOUNTER — Telehealth (HOSPITAL_COMMUNITY): Payer: Self-pay

## 2021-06-26 ENCOUNTER — Ambulatory Visit (HOSPITAL_COMMUNITY)
Admit: 2021-06-26 | Discharge: 2021-06-26 | Disposition: A | Discharge status: Home / Self Care | Payer: 59 | Attending: Cardiology | Admitting: Cardiology

## 2021-06-26 ENCOUNTER — Other Ambulatory Visit (HOSPITAL_COMMUNITY): Payer: Self-pay

## 2021-06-26 VITALS — BP 135/70 | HR 70 | Wt 180.6 lb

## 2021-06-26 DIAGNOSIS — Z5941 Food insecurity: Secondary | ICD-10-CM | POA: Diagnosis not present

## 2021-06-26 DIAGNOSIS — E1122 Type 2 diabetes mellitus with diabetic chronic kidney disease: Secondary | ICD-10-CM | POA: Diagnosis not present

## 2021-06-26 DIAGNOSIS — N179 Acute kidney failure, unspecified: Secondary | ICD-10-CM | POA: Diagnosis not present

## 2021-06-26 DIAGNOSIS — I130 Hypertensive heart and chronic kidney disease with heart failure and stage 1 through stage 4 chronic kidney disease, or unspecified chronic kidney disease: Secondary | ICD-10-CM | POA: Diagnosis present

## 2021-06-26 DIAGNOSIS — E785 Hyperlipidemia, unspecified: Secondary | ICD-10-CM | POA: Diagnosis not present

## 2021-06-26 DIAGNOSIS — N1832 Chronic kidney disease, stage 3b: Secondary | ICD-10-CM | POA: Diagnosis not present

## 2021-06-26 DIAGNOSIS — Z79899 Other long term (current) drug therapy: Secondary | ICD-10-CM | POA: Diagnosis not present

## 2021-06-26 DIAGNOSIS — I50810 Right heart failure, unspecified: Secondary | ICD-10-CM | POA: Diagnosis not present

## 2021-06-26 DIAGNOSIS — Z5986 Financial insecurity: Secondary | ICD-10-CM | POA: Insufficient documentation

## 2021-06-26 DIAGNOSIS — Z87891 Personal history of nicotine dependence: Secondary | ICD-10-CM | POA: Insufficient documentation

## 2021-06-26 DIAGNOSIS — I340 Nonrheumatic mitral (valve) insufficiency: Secondary | ICD-10-CM | POA: Diagnosis not present

## 2021-06-26 DIAGNOSIS — Z596 Low income: Secondary | ICD-10-CM | POA: Diagnosis not present

## 2021-06-26 DIAGNOSIS — I10 Essential (primary) hypertension: Secondary | ICD-10-CM

## 2021-06-26 DIAGNOSIS — I5042 Chronic combined systolic (congestive) and diastolic (congestive) heart failure: Secondary | ICD-10-CM | POA: Diagnosis not present

## 2021-06-26 LAB — BASIC METABOLIC PANEL
Anion gap: 9 (ref 5–15)
BUN: 25 mg/dL — ABNORMAL HIGH (ref 8–23)
CO2: 24 mmol/L (ref 22–32)
Calcium: 9.5 mg/dL (ref 8.9–10.3)
Chloride: 107 mmol/L (ref 98–111)
Creatinine, Ser: 2.08 mg/dL — ABNORMAL HIGH (ref 0.61–1.24)
GFR, Estimated: 35 mL/min — ABNORMAL LOW (ref >60)
Glucose, Bld: 91 mg/dL (ref 70–99)
Potassium: 5.4 mmol/L — ABNORMAL HIGH (ref 3.5–5.1)
Sodium: 140 mmol/L (ref 135–145)

## 2021-06-26 LAB — BRAIN NATRIURETIC PEPTIDE: B Natriuretic Peptide: 334.6 pg/mL — ABNORMAL HIGH (ref 0.0–100.0)

## 2021-06-26 NOTE — Progress Notes (Signed)
Heart and Vascular Care Navigation  06/26/2021  Rodney Schultz 22-Feb-1961 [id number]  Reason for Referral: Patient was seen in HF TOC.   Engaged with patient face to face for initial visit for Heart and Vascular Care Coordination.                                                                                                   Assessment:  Patient lives alone in a single family home and works full time for [employer]. Patient recently hospitalized and unable to work. He states he has insurance through his company but unsure if he has any short term disability. He stated "no work no money". Patient has a very supportive SO Rodney Schultz who assists with all his needs and reports they have already applied for food stamps. Patient shared financial concerns due to lack of income and has not paid his December rent and concerned about the upcoming bills.                                HRT/VAS Care Coordination     Patients Home Cardiology Office --  [facility]   Outpatient Care Team Social Worker   Social Worker Name: Lasandra Beech, [place] [phone]   Living arrangements for the past 2 months Single Family Home   Lives with: Self  Rodney Schultz- girlfriend very supportive   Patient Current Optometrist   Patient Has Concern With Paying Medical Bills Yes   Does Patient Have Prescription Coverage? Yes   Home Assistive Devices/Equipment None       Social History:                                                                             SDOH Screenings   Alcohol Screen: Low Risk    Last Alcohol Screening Score (AUDIT): 0  Depression (PHQ2-9): Low Risk    PHQ-2 Score: 0  Financial Resource Strain: High Risk   Difficulty of Paying Living Expenses: Hard  Food Insecurity: Food Insecurity Present   Worried About Programme researcher, broadcasting/film/video in the Last Year: Never true   Ran Out of Food in the Last Year: Sometimes true  Housing: Low Risk    Last Housing Risk Score: 0  Physical  Activity: Not on file  Social Connections: Not on file  Stress: Not on file  Tobacco Use: Medium Risk   Smoking Tobacco Use: Former   Smokeless Tobacco Use: Never   Passive Exposure: Not on file  Transportation Needs: No Transportation Needs   Lack of Transportation (Medical): No   Lack of Transportation (Non-Medical): No    SDOH Interventions: Financial Resources:  Financial Strain Interventions: Other (Comment) [facility] Disability application) Social Security for Disability application  assistance  Food Insecurity:  Food Insecurity Interventions: Assist with [employer]  Housing Insecurity:   Will assist with rent from the Patient Care Fund  Transportation:    N/a   Follow-up plan:  Patient and SO will continue to pursue application for food stamps at [facility]. CSW referred patient to the [facility] for disability application. Patient and SO will return next week with paperwork for rent assistance from the Patient Care Fund. Patient appears relieved with the support and assistance from the HF team. CSW continues to follow for rent assistance and disability through the [facility]. Lasandra Beech, LCSW, CCSW-MCS [phone]

## 2021-06-26 NOTE — Patient Instructions (Signed)
Labs done today, your results will be available in MyChart, we will contact you for abnormal readings.  If you have any questions, issues, or concerns before your next appointment please call our office at [phone], opt. 2 and leave a message for the triage nurse.  Thank you for allowing [place] to provider your heart failure care after your recent hospitalization. Please follow-up with the Advanced Heart Failure Team  on Tuesday Jan 10th 2023.  Do the following things EVERYDAY: Weigh yourself in the morning before breakfast. Write it down and keep it in a log. Take your medicines as prescribed Eat low salt foods--Limit salt (sodium) to 2000 mg per day.  Stay as active as you can everyday Limit all fluids for the day to less than 2 liters

## 2021-06-26 NOTE — Progress Notes (Signed)
HEART & VASCULAR TRANSITION OF CARE CONSULT NOTE     Referring Physician: Dr. Eldridge Dace  Primary Care: Aleen Campi Kermit Balo, PA-C Primary Cardiologist: Dr. Eldridge Dace   HPI: Referred to clinic by Dr. Eldridge Dace for heart failure consultation.   60 y/o AAM w/ h/o HTN, T2DM and HLD, recently admitted to [facility] w/ new systolic heart failure.   He reports long history of poorly controlled HTN in the past. Baseline SBPs would average in the 170s. Denies ETOH use. Former smoker but recently quit. No known family h/o CHF. No h/o ischemic like CP. No known h/o COVID but reports having the flu ~1 month ago.   Presented to [facility] 11/22 w/ new SOB and found to be in new CHF, hypertensive + AKI w/ SCr in the 2.0 range (previous baseline ~1.5). EKG showed sinus tach w/ LVH. CXR showed bilateral parahilar lung opacities, felt related to pulmonary edema, though cannot r/o infections/inflammatory etiology (also noted on prior CXRs). Echo w/ severe biventricular dysfunction. LVEF 20-25%, mod LVH, GIIIDD (restrictive), RV severely reduced. Moderate MR. Diffuse HK. R/LHC was deferred due to renal insuffiency. He was diuresed w/ IV Lasix. GDMT w/ hydralazine, Imdur and Coreg initiated. Dischaged wt 177 lb. Referred to TOC.   Presents today for f/u. Here w/ his significant other. He has been doing fairly well post discharge. Breathing much improved. Denies any significant wt gain. Wt only up 3 lb. ReDs clip elevated at 42% but ? If accurate. NYHA Class II. No exertional fatigue. No LEE. Denies orthopnea. No PND. No chest pain. BP has been better controlled at home, 135/70 in clinic today. Distal extremities are warm on exam. He has adjusted his diet. Eating less salt. No longer smoking. He is worried about being out of work. Works for a Investment banker, corporate.Requires heavy lifting. Concerned about his ability to pay bills while on leave.    Cardiac Testing   2D Echo 11/22 left ventricular ejection fraction, by estimation, is 20  to 25%. The left ventricle has severely decreased function. The left ventricle demonstrates global hypokinesis. The left ventricular internal cavity size was mildly dilated. There is moderate left ventricular hypertrophy. Left ventricular diastolic parameters are consistent with Grade III diastolic dysfunction (restrictive). The average left ventricular global longitudinal strain is -7.0 %. The global longitudinal strain is abnormal. 1. Right ventricular systolic function is severely reduced. The right ventricular size is normal. 2. 3. Left atrial size was moderately dilated. The mitral valve is normal in structure. Moderate mitral valve regurgitation. No evidence of mitral stenosis. 4. The aortic valve is tricuspid. Aortic valve regurgitation is not visualized. No aortic stenosis is present. 5. The inferior vena cava is normal in size with greater than 50% respiratory variability, suggesting right atrial pressure of 3 mmHg.   Review of Systems: [y] = yes, [ ]  = no   General: Weight gain [ ] ; Weight loss [ ] ; Anorexia [ ] ; Fatigue [ ] ; Fever [ ] ; Chills [ ] ; Weakness [ ]   Cardiac: Chest pain/pressure [ ] ; Resting SOB [ ] ; Exertional SOB [ Y]; Orthopnea [ ] ; Pedal Edema [ ] ; Palpitations [ ] ; Syncope [ ] ; Presyncope [ ] ; Paroxysmal nocturnal dyspnea[ ]   Pulmonary: Cough [ ] ; Wheezing[ ] ; Hemoptysis[ ] ; Sputum [ ] ; Snoring [ ]   GI: Vomiting[ ] ; Dysphagia[ ] ; Melena[ ] ; Hematochezia [ ] ; Heartburn[ ] ; Abdominal pain [ ] ; Constipation [ ] ; Diarrhea [ ] ; BRBPR [ ]   GU: Hematuria[ ] ; Dysuria [ ] ; Nocturia[ ]   Vascular: Pain  in legs with walking [ ] ; Pain in feet with lying flat [ ] ; Non-healing sores [ ] ; Stroke [ ] ; TIA [ ] ; Slurred speech [ ] ;  Neuro: Headaches[ ] ; Vertigo[ ] ; Seizures[ ] ; Paresthesias[ ] ;Blurred vision [ ] ; Diplopia [ ] ; Vision changes [ ]   Ortho/Skin: Arthritis [ ] ; Joint pain [ ] ; Muscle pain [ ] ; Joint swelling [ ] ; Back Pain [ ] ; Rash [ ]   Psych: Depression[ ] ;  Anxiety[ ]   Heme: Bleeding problems [ ] ; Clotting disorders [ ] ; Anemia [ ]   Endocrine: Diabetes [Y]; Thyroid dysfunction[ ]    Past Medical History:  Diagnosis Date   Diabetes mellitus without complication (HCC)    GERD (gastroesophageal reflux disease)    Hypertension     Current Outpatient Medications  Medication Sig Dispense Refill   carvedilol (COREG) 6.25 MG tablet Take 1 tablet (6.25 mg total) by mouth 2 (two) times daily with a meal. 60 tablet 0   furosemide (LASIX) 40 MG tablet Take 1 tablet (40 mg total) by mouth as needed. If you notice that you have gained 3 pounds or more in one day, please take one (1) tablet and call your cardiologist's office 30 tablet 0   glipiZIDE (GLUCOTROL) 10 MG tablet Take 1 tablet (10 mg total) by mouth daily before breakfast. 90 tablet 1   hydrALAZINE (APRESOLINE) 50 MG tablet Take 1 tablet (50 mg total) by mouth every 8 (eight) hours. 90 tablet 0   isosorbide mononitrate (IMDUR) 30 MG 24 hr tablet Take 1 tablet (30 mg total) by mouth daily. 30 tablet 0   Menthol-Methyl Salicylate (MUSCLE RUB EX) Apply 1 application topically 2 (two) times daily as needed (leg pain).     metFORMIN (GLUCOPHAGE-XR) 500 MG 24 hr tablet Take 1 tablet (500 mg total) by mouth 2 (two) times daily. 180 tablet 1   atorvastatin (LIPITOR) 20 MG tablet Take 1 tablet (20 mg total) by mouth daily. (Patient not taking: Reported on 06/26/2021) 90 tablet 3   No current facility-administered medications for this encounter.    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 Pt smoked a pack a week  Vaping Use   Vaping Use: Never used  Substance and Sexual Activity   Alcohol use: Not Currently   Drug use: Never   Sexual activity: Not on file  Other Topics Concern   Not on file  Social History Narrative   Not on file   Social Determinants of Health   Financial Resource Strain: High Risk   Difficulty of Paying Living Expenses: Hard  Food Insecurity: Food Insecurity Present   Worried About [place] in the Last Year: Never true   Ran Out of Food in the Last Year: Sometimes  true  Transportation Needs: No Transportation Needs   Lack of Transportation (Medical): No   Lack of Transportation (Non-Medical): No  Physical Activity: Not on file  Stress: Not on file  Social Connections: Not on file  Intimate Partner Violence: Not on file      Family History  Problem Relation Age of Onset   Healthy Mother     Vitals:   06/26/21 1355  BP: 135/70  Pulse: 70  SpO2: 100%  Weight: 81.9 kg    PHYSICAL EXAM: ReDs Clip 42% (? If accurate)  General:  Well appearing. No respiratory difficulty HEENT: normal Neck: supple. JVD 7 cm. Carotids 2+ bilat; no bruits. No lymphadenopathy or thryomegaly appreciated. Cor: PMI nondisplaced. Regular rate & rhythm. 3/6 MR murmur  Lungs: clear Abdomen: soft, nontender, nondistended. No hepatosplenomegaly. No bruits or masses. Good bowel sounds. Extremities: no cyanosis, clubbing, rash, edema. Distal extremities are warm  Neuro: alert & oriented x 3, cranial nerves grossly intact. moves all 4 extremities w/o difficulty. Affect pleasant.  ECG: not performed    ASSESSMENT & PLAN:  1. Chronic Combined Systolic and Diastolic Heart Failure - newly diagnosed - Echo 11/22 w/ severe biventricular dysfunction, LVEF 20-25%, Mod LVH, GIIIDD  (restrictive), RV severely reduced, diffuse HK - LHC deferred given renal insuffiencey w/ SCr ~2.0 but no history of ischemic CP. Hs trop 48>>49, not c/w ACS. 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 Viral (h/o flu ~1 month prior) vs sarcoid (abnormal CXR bilateral parahilar lung opacities ? Inflammatory etiology and also noted on prior CXRs) - no cMRI yet w/ recent SCr 2.0. Will repeat BMP today. If renal fx improved, will consider LHC +/- cMRI - NYHA Class II currently. Exam not concerning for low output  - Volume status ok on exam. ReDS clip 42% but ? Accurate. Will check BNP. May need daily loop diuretic, torsemide 20 mg daily  (currently ordered as Lasix PRN)  - no ARNi/ARB, spiro, dig given renal fx  - continue Imdur 30 mg daily  - increase hydralazine to 75 mg tid  - continue Coreg 6.25. No titration today w/ possible fluid overload  - consider addition of SGLT2i next pending renal fx  - Check BMP today. If SCr improved to <1.7, will set up for LHC followed by cMRI - Refer to the [facility] for further w/u and management. Will need RHC. May need advanced therapies if EF does not improve w/ medical therapy/ BP control. W/ severe RV dysfunction and SCr >2, would be poor VAD candidate. Will need to be tobacco free for > 6 months before being considered for possible transplant (quit 11/22).    2. 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 ? Cardiorenal component  - recent UA w/ proteinuria, >300  - repeat BMP today  - if SCr still elevated/ w/ low CO2, may need RHC sooner rather than later to check hemodynamics  - refer to outpatient nephrology   3. Hypertension - long history of poor control - now improved on current regimen  - needs sleep study to r/o OSA   4. Mitral Regurgitation  - mod on recent echo -  likely functional - will follow   5. Type 2DM  - controlled, Hgb A1c 6.9  - followed by PCP  - repeat BMP, consider addition of  SGLT2i if renal fx stable   6. Tobacco Use - former smoker, recently quit     NYHA II GDMT  Diuretic- Lasix 40 mg PRN  BB- Coreg 6.25 mg bid  Ace/ARB/ARNI no SCr >2.0 MRA no SCr > 2.0  SGLT2i no    Referred to HFSW (PCP, Medications, Transportation, ETOH Abuse, Drug Abuse, Insurance, Museum/gallery curator ): Yes  Refer to Pharmacy: Yes Refer to Home Health: No Refer to Advanced Heart Failure Clinic: Yes  Refer to General Cardiology:  No  Follow up in the [facility]

## 2021-06-26 NOTE — Telephone Encounter (Signed)
Call attempted to confirm HV TOC appt today at 2pm. HIPPA appropriate VM left with callback number.   Shemar Plemmons, MSN, RN Heart Failure Nurse Navigator [phone]  

## 2021-06-26 NOTE — Progress Notes (Signed)
ReDS Vest / Clip - 06/26/21 1400       ReDS Vest / Clip   Station Marker C    Ruler Value 27    ReDS Value Range High volume overload    ReDS Actual Value 42

## 2021-06-27 ENCOUNTER — Telehealth (HOSPITAL_COMMUNITY): Payer: Self-pay | Admitting: Cardiology

## 2021-06-27 ENCOUNTER — Other Ambulatory Visit: Payer: Self-pay

## 2021-06-27 ENCOUNTER — Other Ambulatory Visit (HOSPITAL_BASED_OUTPATIENT_CLINIC_OR_DEPARTMENT_OTHER): Payer: Self-pay

## 2021-06-27 DIAGNOSIS — I50810 Right heart failure, unspecified: Secondary | ICD-10-CM

## 2021-06-27 MED ORDER — TORSEMIDE 20 MG PO TABS
20.0000 mg | ORAL_TABLET | Freq: Every day | ORAL | 3 refills | Status: DC
  Filled 2021-06-27: qty 30, 30d supply, fill #0
  Filled 2021-07-28: qty 30, 30d supply, fill #1

## 2021-06-27 NOTE — Telephone Encounter (Signed)
-----   Message from Allayne Butcher, [place] sent at 06/26/2021  5:38 PM EST ----- Let pt know he has some mild fluid accumulation. BNP (fluid marker) is elevated. ReDs Clip was also elevated today. K also elevated.   Start torsemide 20 mg daily. Repeat BMP and BNP on Monday.

## 2021-06-27 NOTE — Telephone Encounter (Signed)
Patient called.  Patient aware pt voiced understanding repat labs 12/12

## 2021-06-30 ENCOUNTER — Ambulatory Visit (HOSPITAL_COMMUNITY)
Admission: RE | Admit: 2021-06-30 | Discharge: 2021-06-30 | Disposition: A | Discharge status: Home / Self Care | Payer: 59 | Source: Ambulatory Visit | Attending: Cardiology | Admitting: Cardiology

## 2021-06-30 ENCOUNTER — Other Ambulatory Visit: Payer: Self-pay

## 2021-06-30 DIAGNOSIS — I50810 Right heart failure, unspecified: Secondary | ICD-10-CM | POA: Diagnosis present

## 2021-06-30 LAB — BASIC METABOLIC PANEL
Anion gap: 6 (ref 5–15)
BUN: 33 mg/dL — ABNORMAL HIGH (ref 8–23)
CO2: 27 mmol/L (ref 22–32)
Calcium: 9.2 mg/dL (ref 8.9–10.3)
Chloride: 105 mmol/L (ref 98–111)
Creatinine, Ser: 2.34 mg/dL — ABNORMAL HIGH (ref 0.61–1.24)
GFR, Estimated: 31 mL/min — ABNORMAL LOW (ref >60)
Glucose, Bld: 91 mg/dL (ref 70–99)
Potassium: 4.9 mmol/L (ref 3.5–5.1)
Sodium: 138 mmol/L (ref 135–145)

## 2021-06-30 LAB — BRAIN NATRIURETIC PEPTIDE: B Natriuretic Peptide: 183.3 pg/mL — ABNORMAL HIGH (ref 0.0–100.0)

## 2021-07-01 ENCOUNTER — Telehealth (HOSPITAL_COMMUNITY): Payer: Self-pay | Admitting: Licensed Clinical Social Worker

## 2021-07-01 NOTE — Telephone Encounter (Signed)
CSW received requested documents form patient for rental assistance through the patient care fund. CSW submitted for assistance and notified patient and SO that check will be issued by the end of the week.   Patient left disability paperwork to be completed for short term disability through employer. CSW gave to RN staff to assist with completion and made [place] SO aware of pending disability paperwork.   Patient and SO are very grateful for the support and assistance. CSW will continue to follow for assistance as needed. Lasandra Beech, LCSW, CCSW-MCS [phone]

## 2021-07-02 ENCOUNTER — Telehealth: Payer: Self-pay | Admitting: *Deleted

## 2021-07-02 NOTE — Telephone Encounter (Signed)
Transition Care Management Follow-up Telephone Call Date of discharge and from where: 06/20/21  [facility] How have you been since you were released from the hospital? "Doing better" Any questions or concerns? No  Items Reviewed: Did the pt receive and understand the discharge instructions provided? Yes  Medications obtained and verified? Yes  Other? No  Any new allergies since your discharge? No  Dietary orders reviewed? Yes Do you have support at home? Yes   Home Care and Equipment/Supplies: Were home health services ordered? no If so, what is the name of the agency? N/a  Has the agency set up a time to come to the patient's home? not applicable Were any new equipment or medical supplies ordered?  No What is the name of the medical supply agency? N/a Were you able to get the supplies/equipment? not applicable Do you have any questions related to the use of the equipment or supplies? N/a  Functional Questionnaire: (I = Independent and D = Dependent) ADLs: I  Bathing/Dressing- I  Meal Prep- I  Eating- I  Maintaining continence- I  Transferring/Ambulation- I  Managing Meds- I  Follow up appointments reviewed:  PCP Hospital f/u appt confirmed? No  .  Specialist Hospital f/u appt confirmed? Yes  Saw cardiologist on 06/26/21 Are transportation arrangements needed? No  If their condition worsens, is the pt aware to call PCP or go to the Emergency Dept.? Yes Was the patient provided with contact information for the PCP's office or ED? Yes Was to pt encouraged to call back with questions or concerns? Yes  Irving Shows [facility], BSN RN Case Manager [phone]

## 2021-07-04 ENCOUNTER — Telehealth (HOSPITAL_COMMUNITY): Payer: Self-pay | Admitting: Licensed Clinical Social Worker

## 2021-07-04 NOTE — Telephone Encounter (Signed)
CSW received a call from patient stating that he received a call from the short term disability [employer] requesting paperwork. CSW informed patient that it is in process and will be faxed over as soon as the MD is able complete. CSW will continue to be available as needed. Lasandra Beech, LCSW, CCSW-MCS [phone]

## 2021-07-07 ENCOUNTER — Other Ambulatory Visit: Payer: Self-pay

## 2021-07-07 ENCOUNTER — Other Ambulatory Visit (HOSPITAL_BASED_OUTPATIENT_CLINIC_OR_DEPARTMENT_OTHER): Payer: Self-pay

## 2021-07-08 ENCOUNTER — Telehealth (HOSPITAL_COMMUNITY): Payer: Self-pay | Admitting: Licensed Clinical Social Worker

## 2021-07-08 ENCOUNTER — Other Ambulatory Visit: Payer: Self-pay

## 2021-07-08 NOTE — Telephone Encounter (Signed)
CSW contacted patient to inform that his disability paperwork was completed and faxed tom [employer] Patient was so grateful and will contact if further needs arise. Lasandra Beech, LCSW, CCSW-MCS [phone]

## 2021-07-18 ENCOUNTER — Other Ambulatory Visit: Payer: Self-pay | Admitting: Student

## 2021-07-18 ENCOUNTER — Other Ambulatory Visit (HOSPITAL_BASED_OUTPATIENT_CLINIC_OR_DEPARTMENT_OTHER): Payer: Self-pay

## 2021-07-18 ENCOUNTER — Other Ambulatory Visit (HOSPITAL_COMMUNITY): Payer: Self-pay

## 2021-07-21 ENCOUNTER — Other Ambulatory Visit (HOSPITAL_BASED_OUTPATIENT_CLINIC_OR_DEPARTMENT_OTHER): Payer: Self-pay

## 2021-07-21 MED ORDER — ISOSORBIDE MONONITRATE ER 30 MG PO TB24
30.0000 mg | ORAL_TABLET | Freq: Every day | ORAL | 0 refills | Status: DC
  Filled 2021-07-21: qty 30, 30d supply, fill #0

## 2021-07-23 ENCOUNTER — Telehealth (HOSPITAL_COMMUNITY): Payer: Self-pay | Admitting: Licensed Clinical Social Worker

## 2021-07-23 NOTE — Telephone Encounter (Signed)
CSW received call form patient who shared that he contacted [employer] regarding his short term disability and they are processing the request. He states he is short for his monthly rent and requests assistance if possible. CSW reviewed previous Patient Care Fund request and able to assist with $400 towards his monthly rent. Patient grateful for the support and assistance and bridging this financial gap for him during this stressful time. CSW available as needed. Lasandra Beech, LCSW, CCSW-MCS [phone]

## 2021-07-28 ENCOUNTER — Other Ambulatory Visit (HOSPITAL_BASED_OUTPATIENT_CLINIC_OR_DEPARTMENT_OTHER): Payer: Self-pay

## 2021-07-28 ENCOUNTER — Other Ambulatory Visit (HOSPITAL_COMMUNITY): Payer: Self-pay

## 2021-07-28 ENCOUNTER — Other Ambulatory Visit: Payer: Self-pay | Admitting: Student

## 2021-07-28 NOTE — H&P (View-Only) (Signed)
ADVANCED HF CLINIC CONSULT NOTE  Referring Physician: Ellen Henri, PA-C Primary Care: Carlena Hurl, PA-C Primary Cardiologist: Dr. Irish Lack HF Cardiologist: Dr. Aundra Dubin  HPI: Rodney Schultz is a 61 y.o. AAM w/ h/o HTN, T2DM and HLD, recently admitted to [facility] w/ new systolic heart failure.    He reports long history of poorly controlled HTN in the past. Baseline SBPs would average in the 170s. Denies ETOH use. Former smoker but recently quit. No known family h/o CHF. No h/o ischemic like CP. No known h/o COVID but reports having the flu ~1 month ago.    Admitted 11/22 w/ new CHF, hypertensive + AKI w/ SCr in the 2.0 range (previous baseline ~1.5). Echo w/ severe biventricular dysfunction. LVEF 20-25%, mod LVH, GIIIDD (restrictive), RV severely reduced. Moderate MR. Diffuse HK. R/LHC was deferred due to renal insuffiency. He was diuresed w/ IV Lasix. GDMT w/ hydralazine, Imdur and Coreg initiated. Dischaged wt 177 lb. Referred to TOC.    Seen in TOC. GDMT titrated, plan for [facility] +/- cMRI pending renal panel. Referred to Nephrology.  Today he presents to establish with AHF team, with his wife. He gets SOB with walking on flat ground but no dyspnea with ADLs. Overall feeling fine. Denies palpitations, abnormal bleeding, CP, dizziness, edema, or PND/Orthopnea. Appetite ok. No fever or chills. Weight at home 180 pounds. Has been out of carvedilol x 1 week. Works for a [employer], clinical. Requires heavy lifting. BP at home 130s at home. Wife says he snores and he has daytime fatigue. Wife does most of cooking and limits salt.  ReDs: 38%  ECG (personally reviewed): SR with LVH   Cardiac Testing  - Echo (11/22): EF 20-25%, severe LV dysfunction with global HK, moderate LVH, grade III DD (restrictive), RV severely reduced, moderate MR   Review of Systems: [y] = yes, [ ]  = no   General: Weight gain [ ] ; Weight loss [ ] ; Anorexia [ ] ; Fatigue [ ] ; Fever [ ] ; Chills [ ] ; Weakness [ ]   Cardiac:  Chest pain/pressure [ ] ; Resting SOB [ ] ; Exertional SOB Blue.Reese ]; Orthopnea [ ] ; Pedal Edema [ ] ; Palpitations [ ] ; Syncope [ ] ; Presyncope [ ] ; Paroxysmal nocturnal dyspnea[ ]   Pulmonary: Cough [ ] ; Wheezing[ ] ; Hemoptysis[ ] ; Sputum [ ] ; Snoring [ ]   GI: Vomiting[ ] ; Dysphagia[ ] ; Melena[ ] ; Hematochezia [ ] ; Heartburn[ ] ; Abdominal pain [ ] ; Constipation [ ] ; Diarrhea [ ] ; BRBPR [ ]   GU: Hematuria[ ] ; Dysuria [ ] ; Nocturia[ ]   Vascular: Pain in legs with walking [ ] ; Pain in feet with lying flat [ ] ; Non-healing sores [ ] ; Stroke [ ] ; TIA [ ] ; Slurred speech [ ] ;  Neuro: Headaches[ ] ; Vertigo[ ] ; Seizures[ ] ; Paresthesias[ ] ;Blurred vision [ ] ; Diplopia [ ] ; Vision changes [ ]   Ortho/Skin: Arthritis [ ] ; Joint pain [ ] ; Muscle pain [ ] ; Joint swelling [ ] ; Back Pain [ ] ; Rash [ ]   Psych: Depression[ ] ; Anxiety[ ]   Heme: Bleeding problems [ ] ; Clotting disorders [ ] ; Anemia [ ]   Endocrine: Diabetes Blue.Reese ]; Thyroid dysfunction[ ]   Past Medical History:  Diagnosis Date   Diabetes mellitus without complication (HCC)    GERD (gastroesophageal reflux disease)    Hypertension    Current Outpatient Medications  Medication Sig Dispense Refill   glipiZIDE (GLUCOTROL) 10 MG tablet Take 1 tablet (10 mg total) by mouth daily before breakfast. 90 tablet 1   hydrALAZINE (APRESOLINE) 50 MG tablet Take 1  tablet (50 mg total) by mouth every 8 (eight) hours. 90 tablet 0   isosorbide mononitrate (IMDUR) 30 MG 24 hr tablet Take 1 tablet (30 mg total) by mouth daily. 30 tablet 0   metFORMIN (GLUCOPHAGE-XR) 500 MG 24 hr tablet Take 1 tablet (500 mg total) by mouth 2 (two) times daily. 180 tablet 1   torsemide (DEMADEX) 20 MG tablet Take 1 tablet (20 mg total) by mouth daily. 90 tablet 3   carvedilol (COREG) 6.25 MG tablet Take 1 tablet (6.25 mg total) by mouth 2 (two) times daily with a meal. (Patient not taking: Reported on 07/29/2021) 60 tablet 0   Menthol-Methyl Salicylate (MUSCLE RUB EX) Apply 1 application  topically 2 (two) times daily as needed (leg pain). (Patient not taking: Reported on 07/29/2021)     No current facility-administered medications for this encounter.   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 Pt smoked a pack a week  Vaping Use   Vaping Use: Never used  Substance and Sexual Activity   Alcohol use: Not Currently   Drug use: Never   Sexual activity: Not on file  Other Topics Concern   Not on file  Social History Narrative   Not on file   Social Determinants of Health   Financial Resource Strain: High Risk   Difficulty of Paying Living Expenses: Hard  Food Insecurity: Food Insecurity Present   Worried About [place] in the Last Year: Never true   Ran Out of Food in the Last Year: Sometimes true  Transportation Needs: No Transportation Needs   Lack of Transportation (Medical): No   Lack of Transportation (Non-Medical): No  Physical Activity: Not on file  Stress: Not on file  Social Connections: Not on file  Intimate Partner Violence: Not on file   Family History  Problem Relation Age of Onset   Healthy Mother    BP (!) 160/98    Pulse 63    Wt 83.9 kg (185 lb)    SpO2 99%    BMI 26.54 kg/m   Wt Readings from Last 3 Encounters:  07/29/21 83.9 kg (185 lb)  06/26/21 81.9 kg (180 lb 9.6 oz)  06/20/21 80.7 kg (177 lb 14.4 oz)   PHYSICAL EXAM: General:  NAD. No resp  difficulty HEENT: Normal Neck: Supple. JVP 6-7. Carotids 2+ bilat; no bruits. No lymphadenopathy or thryomegaly appreciated. Cor: PMI nondisplaced. Regular rate & rhythm. No rubs, gallops or murmurs. Lungs: Clear Abdomen: Soft, nontender, nondistended. No hepatosplenomegaly. No bruits or masses. Good bowel sounds. Extremities: No cyanosis, clubbing, rash, edema Neuro: Alert & oriented x 3, cranial nerves grossly intact. Moves all 4 extremities w/o difficulty. Affect pleasant.  ASSESSMENT & PLAN: 1. Chronic Combined Systolic and Diastolic Heart Failure -  newly diagnosed - Echo (11/22): w/ severe biventricular dysfunction, LVEF 20-25%, Mod LVH, GIIIDD (restrictive), RV severely reduced, diffuse HK - LHC deferred given renal insuffiencey w/ SCr ~2.0 but no history of ischemic CP. 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  - Also ? Viral (h/o flu ~1 month prior) vs sarcoid (abnormal CXR bilateral parahilar lung opacities ? Inflammatory etiology and also noted on prior CXRs) - no cMRI yet w/ recent SCr 2.34.  - NYHA Class II-early III. He does not appear volume overloaded on exam, however weight up 5 lbs and ReDs 38%. GDMT limited by CKD. - Start Jardiance 10 mg daily. BMET/BNP today, repeat BMET in 10-14 days. Given co-pay card today. - Restart carvedilol 6.25 mg bid.  - Continue torsemide 20 mg daily for now. - Continue hydralazine 75 mg tid + Imdur 30 mg daily. - no ARNi/ARB, spiro, dig given CKD. - Check BMET today. If SCr improved to <1.7, will set up for LHC followed by cMRI - Will need RHC, discussed with Dr. Aundra Dubin and will arrange soon. May need advanced therapies if EF does not improve w/ medical therapy/ BP control. W/ severe RV dysfunction and SCr >2, would be poor VAD candidate. Will need to be tobacco free for > 6 months before being considered for possible transplant (quit  11/22).     2. CKD, Stage IIIb  - Baseline SCr ~1.5 (in April 22).  - Recent AKI w/ SCr ~2.0, suspect hypertensive nephropathy. ? Cardiorenal component  - Recent UA w/ proteinuria, >300.  - Refer to Nephrology. - BMET today, follow renal function closely.   3. Hypertension - Long history of poor control. - Restart carvedilol. - Discuss sleep study next visit.   4. Mitral Regurgitation  - Mod on recent echo - Likely functional - Will follow.    5. Type 2DM  - Controlled, Hgb A1c 6.9  - Followed by PCP.  - Start SGLT2i as above.   6. Tobacco Use - Former smoker, recently quit x 1 month. - Previously smoked 1 cig/day.  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 I do not think that he can return in full capacity yet. Will need to see how he does over the next several weeks tolerating his medications. We discussed this and he is agreeable. Disability papers given to [facility] today.   Arrange for RHC with Dr. Aundra Dubin to assess hemodynamics. Follow up with APP 2 weeks after cath. Will arrange follow up echo in 3 months with Dr. Aundra Dubin.  Allena Katz, FNP-BC 07/29/21

## 2021-07-28 NOTE — Progress Notes (Signed)
ADVANCED HF CLINIC CONSULT NOTE  Referring Physician: Ellen Henri, PA-C Primary Care: Carlena Hurl, PA-C Primary Cardiologist: Dr. Irish Lack HF Cardiologist: Dr. Aundra Dubin  HPI: Rodney Schultz is a 61 y.o. AAM w/ h/o HTN, T2DM and HLD, recently admitted to [facility] w/ new systolic heart failure.    He reports long history of poorly controlled HTN in the past. Baseline SBPs would average in the 170s. Denies ETOH use. Former smoker but recently quit. No known family h/o CHF. No h/o ischemic like CP. No known h/o COVID but reports having the flu ~1 month ago.    Admitted 11/22 w/ new CHF, hypertensive + AKI w/ SCr in the 2.0 range (previous baseline ~1.5). Echo w/ severe biventricular dysfunction. LVEF 20-25%, mod LVH, GIIIDD (restrictive), RV severely reduced. Moderate MR. Diffuse HK. R/LHC was deferred due to renal insuffiency. He was diuresed w/ IV Lasix. GDMT w/ hydralazine, Imdur and Coreg initiated. Dischaged wt 177 lb. Referred to TOC.    Seen in TOC. GDMT titrated, plan for [facility] +/- cMRI pending renal panel. Referred to Nephrology.  Today he presents to establish with AHF team, with his wife. He gets SOB with walking on flat ground but no dyspnea with ADLs. Overall feeling fine. Denies palpitations, abnormal bleeding, CP, dizziness, edema, or PND/Orthopnea. Appetite ok. No fever or chills. Weight at home 180 pounds. Has been out of carvedilol x 1 week. Works for a [employer], clinical. Requires heavy lifting. BP at home 130s at home. Wife says he snores and he has daytime fatigue. Wife does most of cooking and limits salt.  ReDs: 38%  ECG (personally reviewed): SR with LVH   Cardiac Testing  - Echo (11/22): EF 20-25%, severe LV dysfunction with global HK, moderate LVH, grade III DD (restrictive), RV severely reduced, moderate MR   Review of Systems: [y] = yes, [ ]  = no   General: Weight gain [ ] ; Weight loss [ ] ; Anorexia [ ] ; Fatigue [ ] ; Fever [ ] ; Chills [ ] ; Weakness [ ]   Cardiac:  Chest pain/pressure [ ] ; Resting SOB [ ] ; Exertional SOB Blue.Reese ]; Orthopnea [ ] ; Pedal Edema [ ] ; Palpitations [ ] ; Syncope [ ] ; Presyncope [ ] ; Paroxysmal nocturnal dyspnea[ ]   Pulmonary: Cough [ ] ; Wheezing[ ] ; Hemoptysis[ ] ; Sputum [ ] ; Snoring [ ]   GI: Vomiting[ ] ; Dysphagia[ ] ; Melena[ ] ; Hematochezia [ ] ; Heartburn[ ] ; Abdominal pain [ ] ; Constipation [ ] ; Diarrhea [ ] ; BRBPR [ ]   GU: Hematuria[ ] ; Dysuria [ ] ; Nocturia[ ]   Vascular: Pain in legs with walking [ ] ; Pain in feet with lying flat [ ] ; Non-healing sores [ ] ; Stroke [ ] ; TIA [ ] ; Slurred speech [ ] ;  Neuro: Headaches[ ] ; Vertigo[ ] ; Seizures[ ] ; Paresthesias[ ] ;Blurred vision [ ] ; Diplopia [ ] ; Vision changes [ ]   Ortho/Skin: Arthritis [ ] ; Joint pain [ ] ; Muscle pain [ ] ; Joint swelling [ ] ; Back Pain [ ] ; Rash [ ]   Psych: Depression[ ] ; Anxiety[ ]   Heme: Bleeding problems [ ] ; Clotting disorders [ ] ; Anemia [ ]   Endocrine: Diabetes Blue.Reese ]; Thyroid dysfunction[ ]   Past Medical History:  Diagnosis Date   Diabetes mellitus without complication (HCC)    GERD (gastroesophageal reflux disease)    Hypertension    Current Outpatient Medications  Medication Sig Dispense Refill   glipiZIDE (GLUCOTROL) 10 MG tablet Take 1 tablet (10 mg total) by mouth daily before breakfast. 90 tablet 1   hydrALAZINE (APRESOLINE) 50 MG tablet Take 1  tablet (50 mg total) by mouth every 8 (eight) hours. 90 tablet 0   isosorbide mononitrate (IMDUR) 30 MG 24 hr tablet Take 1 tablet (30 mg total) by mouth daily. 30 tablet 0   metFORMIN (GLUCOPHAGE-XR) 500 MG 24 hr tablet Take 1 tablet (500 mg total) by mouth 2 (two) times daily. 180 tablet 1   torsemide (DEMADEX) 20 MG tablet Take 1 tablet (20 mg total) by mouth daily. 90 tablet 3   carvedilol (COREG) 6.25 MG tablet Take 1 tablet (6.25 mg total) by mouth 2 (two) times daily with a meal. (Patient not taking: Reported on 07/29/2021) 60 tablet 0   Menthol-Methyl Salicylate (MUSCLE RUB EX) Apply 1 application  topically 2 (two) times daily as needed (leg pain). (Patient not taking: Reported on 07/29/2021)     No current facility-administered medications for this encounter.   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 Pt smoked a pack a week  Vaping Use   Vaping Use: Never used  Substance and Sexual Activity   Alcohol use: Not Currently   Drug use: Never   Sexual activity: Not on file  Other Topics Concern   Not on file  Social History Narrative   Not on file   Social Determinants of Health   Financial Resource Strain: High Risk   Difficulty of Paying Living Expenses: Hard  Food Insecurity: Food Insecurity Present   Worried About [place] in the Last Year: Never true   Ran Out of Food in the Last Year: Sometimes true  Transportation Needs: No Transportation Needs   Lack of Transportation (Medical): No   Lack of Transportation (Non-Medical): No  Physical Activity: Not on file  Stress: Not on file  Social Connections: Not on file  Intimate Partner Violence: Not on file   Family History  Problem Relation Age of Onset   Healthy Mother    BP (!) 160/98    Pulse 63    Wt 83.9 kg (185 lb)    SpO2 99%    BMI 26.54 kg/m   Wt Readings from Last 3 Encounters:  07/29/21 83.9 kg (185 lb)  06/26/21 81.9 kg (180 lb 9.6 oz)  06/20/21 80.7 kg (177 lb 14.4 oz)   PHYSICAL EXAM: General:  NAD. No resp  difficulty HEENT: Normal Neck: Supple. JVP 6-7. Carotids 2+ bilat; no bruits. No lymphadenopathy or thryomegaly appreciated. Cor: PMI nondisplaced. Regular rate & rhythm. No rubs, gallops or murmurs. Lungs: Clear Abdomen: Soft, nontender, nondistended. No hepatosplenomegaly. No bruits or masses. Good bowel sounds. Extremities: No cyanosis, clubbing, rash, edema Neuro: Alert & oriented x 3, cranial nerves grossly intact. Moves all 4 extremities w/o difficulty. Affect pleasant.  ASSESSMENT & PLAN: 1. Chronic Combined Systolic and Diastolic Heart Failure -  newly diagnosed - Echo (11/22): w/ severe biventricular dysfunction, LVEF 20-25%, Mod LVH, GIIIDD (restrictive), RV severely reduced, diffuse HK - LHC deferred given renal insuffiencey w/ SCr ~2.0 but no history of ischemic CP. 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  - Also ? Viral (h/o flu ~1 month prior) vs sarcoid (abnormal CXR bilateral parahilar lung opacities ? Inflammatory etiology and also noted on prior CXRs) - no cMRI yet w/ recent SCr 2.34.  - NYHA Class II-early III. He does not appear volume overloaded on exam, however weight up 5 lbs and ReDs 38%. GDMT limited by CKD. - Start Jardiance 10 mg daily. BMET/BNP today, repeat BMET in 10-14 days. Given co-pay card today. - Restart carvedilol 6.25 mg bid.  - Continue torsemide 20 mg daily for now. - Continue hydralazine 75 mg tid + Imdur 30 mg daily. - no ARNi/ARB, spiro, dig given CKD. - Check BMET today. If SCr improved to <1.7, will set up for LHC followed by cMRI - Will need RHC, discussed with Dr. Aundra Dubin and will arrange soon. May need advanced therapies if EF does not improve w/ medical therapy/ BP control. W/ severe RV dysfunction and SCr >2, would be poor VAD candidate. Will need to be tobacco free for > 6 months before being considered for possible transplant (quit  11/22).     2. CKD, Stage IIIb  - Baseline SCr ~1.5 (in April 22).  - Recent AKI w/ SCr ~2.0, suspect hypertensive nephropathy. ? Cardiorenal component  - Recent UA w/ proteinuria, >300.  - Refer to Nephrology. - BMET today, follow renal function closely.   3. Hypertension - Long history of poor control. - Restart carvedilol. - Discuss sleep study next visit.   4. Mitral Regurgitation  - Mod on recent echo - Likely functional - Will follow.    5. Type 2DM  - Controlled, Hgb A1c 6.9  - Followed by PCP.  - Start SGLT2i as above.   6. Tobacco Use - Former smoker, recently quit x 1 month. - Previously smoked 1 cig/day.  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 I do not think that he can return in full capacity yet. Will need to see how he does over the next several weeks tolerating his medications. We discussed this and he is agreeable. Disability papers given to [facility] today.   Arrange for RHC with Dr. Aundra Dubin to assess hemodynamics. Follow up with APP 2 weeks after cath. Will arrange follow up echo in 3 months with Dr. Aundra Dubin.  Allena Katz, FNP-BC 07/29/21

## 2021-07-29 ENCOUNTER — Encounter (HOSPITAL_COMMUNITY): Payer: Self-pay

## 2021-07-29 ENCOUNTER — Ambulatory Visit (HOSPITAL_COMMUNITY)
Admission: RE | Admit: 2021-07-29 | Discharge: 2021-07-29 | Disposition: A | Discharge status: Home / Self Care | Payer: 59 | Source: Ambulatory Visit | Attending: Family Medicine | Admitting: Family Medicine

## 2021-07-29 ENCOUNTER — Other Ambulatory Visit (HOSPITAL_COMMUNITY): Payer: Self-pay

## 2021-07-29 ENCOUNTER — Other Ambulatory Visit (HOSPITAL_BASED_OUTPATIENT_CLINIC_OR_DEPARTMENT_OTHER): Payer: Self-pay

## 2021-07-29 ENCOUNTER — Other Ambulatory Visit: Payer: Self-pay

## 2021-07-29 VITALS — BP 160/98 | HR 63 | Wt 185.0 lb

## 2021-07-29 DIAGNOSIS — Z79899 Other long term (current) drug therapy: Secondary | ICD-10-CM | POA: Insufficient documentation

## 2021-07-29 DIAGNOSIS — I340 Nonrheumatic mitral (valve) insufficiency: Secondary | ICD-10-CM | POA: Insufficient documentation

## 2021-07-29 DIAGNOSIS — N1832 Chronic kidney disease, stage 3b: Secondary | ICD-10-CM

## 2021-07-29 DIAGNOSIS — I10 Essential (primary) hypertension: Secondary | ICD-10-CM

## 2021-07-29 DIAGNOSIS — R0683 Snoring: Secondary | ICD-10-CM | POA: Insufficient documentation

## 2021-07-29 DIAGNOSIS — Z87891 Personal history of nicotine dependence: Secondary | ICD-10-CM | POA: Diagnosis not present

## 2021-07-29 DIAGNOSIS — I5042 Chronic combined systolic (congestive) and diastolic (congestive) heart failure: Secondary | ICD-10-CM | POA: Insufficient documentation

## 2021-07-29 DIAGNOSIS — E785 Hyperlipidemia, unspecified: Secondary | ICD-10-CM | POA: Diagnosis not present

## 2021-07-29 DIAGNOSIS — R0602 Shortness of breath: Secondary | ICD-10-CM | POA: Insufficient documentation

## 2021-07-29 DIAGNOSIS — Z7901 Long term (current) use of anticoagulants: Secondary | ICD-10-CM | POA: Diagnosis not present

## 2021-07-29 DIAGNOSIS — I130 Hypertensive heart and chronic kidney disease with heart failure and stage 1 through stage 4 chronic kidney disease, or unspecified chronic kidney disease: Secondary | ICD-10-CM | POA: Diagnosis not present

## 2021-07-29 DIAGNOSIS — Z7984 Long term (current) use of oral hypoglycemic drugs: Secondary | ICD-10-CM | POA: Diagnosis not present

## 2021-07-29 DIAGNOSIS — E1122 Type 2 diabetes mellitus with diabetic chronic kidney disease: Secondary | ICD-10-CM | POA: Diagnosis not present

## 2021-07-29 LAB — BASIC METABOLIC PANEL
Anion gap: 10 (ref 5–15)
BUN: 29 mg/dL — ABNORMAL HIGH (ref 8–23)
CO2: 26 mmol/L (ref 22–32)
Calcium: 9.6 mg/dL (ref 8.9–10.3)
Chloride: 102 mmol/L (ref 98–111)
Creatinine, Ser: 1.95 mg/dL — ABNORMAL HIGH (ref 0.61–1.24)
GFR, Estimated: 38 mL/min — ABNORMAL LOW (ref >60)
Glucose, Bld: 111 mg/dL — ABNORMAL HIGH (ref 70–99)
Potassium: 4.3 mmol/L (ref 3.5–5.1)
Sodium: 138 mmol/L (ref 135–145)

## 2021-07-29 LAB — BRAIN NATRIURETIC PEPTIDE: B Natriuretic Peptide: 69.2 pg/mL (ref 0.0–100.0)

## 2021-07-29 MED ORDER — CARVEDILOL 6.25 MG PO TABS
6.2500 mg | ORAL_TABLET | Freq: Two times a day (BID) | ORAL | 4 refills | Status: DC
  Filled 2021-07-29: qty 60, 30d supply, fill #0
  Filled 2021-08-25 – 2021-08-26 (×2): qty 60, 30d supply, fill #1

## 2021-07-29 MED ORDER — TORSEMIDE 20 MG PO TABS
20.0000 mg | ORAL_TABLET | Freq: Every day | ORAL | 4 refills | Status: DC
  Filled 2021-07-29: qty 30, 30d supply, fill #0

## 2021-07-29 MED ORDER — EMPAGLIFLOZIN 10 MG PO TABS
10.0000 mg | ORAL_TABLET | Freq: Every day | ORAL | 4 refills | Status: DC
  Filled 2021-07-29: qty 30, 30d supply, fill #0
  Filled 2021-08-25 – 2021-08-26 (×2): qty 30, 30d supply, fill #1

## 2021-07-29 MED ORDER — HYDRALAZINE HCL 50 MG PO TABS
50.0000 mg | ORAL_TABLET | Freq: Three times a day (TID) | ORAL | 4 refills | Status: DC
  Filled 2021-07-29: qty 90, 30d supply, fill #0

## 2021-07-29 MED ORDER — ISOSORBIDE MONONITRATE ER 30 MG PO TB24
30.0000 mg | ORAL_TABLET | Freq: Every day | ORAL | 4 refills | Status: DC
  Filled 2021-07-29 – 2021-08-26 (×3): qty 30, 30d supply, fill #0

## 2021-07-29 NOTE — Patient Instructions (Addendum)
Thank you for coming in today   Labs were done today, if any labs are abnormal the clinic will call you  RESTART Coreg 6.25 mg 1 tablet twice daily   START Jardiance 10 mg 1 tablet daily   Your physician recommends that you schedule a follow-up appointment in: 2 weeks after Heart catherization 3 months with Dr Earlean Shawl are scheduled for Cardiac Catheterization on 08/11/2021  with Dr. Shirlee Latch.  Please arrive at the [facility] at 5:30a.m. on the day of your procedure.  1. DIET ___ Nothing to eat or drink after midnight except your medications with a sip of water.    3. MAKE SURE YOU TAKE YOUR ASPIRIN.       ___ YOU MAY TAKE ALL of your remaining medications with a small amount of water.  5. Plan for one night stay - bring personal belongings (i.e. toothpaste, toothbrush, etc.)  6. Bring a current list of your medications and current insurance cards.  7. Must have a responsible person to drive you home.  8. Someone must be with you for the first 24 hours after you arrive home.  9. Please wear clothes that are easy to get on and off and wear slip-on shoes.  * Special note: Every effort is made to have your procedure done on time. Occasionally there are emergencies that present themselves at the hospital that may cause delays. Please be patient if a delay does occur.  If you have any questions after you get home, please call the office at the number listed above.   At the [facility], you and your health needs are our priority. As part of our continuing mission to provide you with exceptional heart care, we have created designated Provider Care Teams. These Care Teams include your primary Cardiologist (physician) and Advanced Practice Providers (APPs- Physician Assistants and Nurse Practitioners) who all work together to provide you with the care you need, when you need it.   You may see any of the following providers on your  designated Care Team at your next follow up: Dr Arvilla Meres Dr Carron Curie, NP Robbie Lis, [place] [facility] [place], [place] Karle Plumber, PharmD   Please be sure to bring in all your medications bottles to every appointment.   If you have any questions or concerns before your next appointment please send [place] a message through [place] or call our office at [phone].    TO LEAVE A MESSAGE FOR THE NURSE SELECT OPTION 2, PLEASE LEAVE A MESSAGE INCLUDING: YOUR NAME DATE OF BIRTH CALL BACK NUMBER REASON FOR CALL**this is important as we prioritize the call backs  YOU WILL RECEIVE A CALL BACK THE SAME DAY AS LONG AS YOU CALL BEFORE 4:00 PM

## 2021-07-29 NOTE — Progress Notes (Signed)
ReDS Vest / Clip - 07/29/21 1100       ReDS Vest / Clip   Station Marker C    Ruler Value 29.5    ReDS Value Range Moderate volume overload    ReDS Actual Value 38

## 2021-07-30 ENCOUNTER — Encounter (HOSPITAL_COMMUNITY): Payer: Self-pay | Admitting: *Deleted

## 2021-07-30 NOTE — Progress Notes (Signed)
Disability forms completed, signed by Dr Shirlee Latch, and faxed along with requested records to [employer] at [phone]

## 2021-08-01 ENCOUNTER — Other Ambulatory Visit (HOSPITAL_COMMUNITY): Payer: Self-pay | Admitting: *Deleted

## 2021-08-01 DIAGNOSIS — I5042 Chronic combined systolic (congestive) and diastolic (congestive) heart failure: Secondary | ICD-10-CM

## 2021-08-01 MED ORDER — SODIUM CHLORIDE 0.9% FLUSH: 3.0000 mL | Freq: Two times a day (BID) | INTRAVENOUS | Status: DC

## 2021-08-04 ENCOUNTER — Telehealth: Payer: Self-pay | Admitting: Medical

## 2021-08-04 NOTE — Telephone Encounter (Signed)
Dismissal letter in guarantor snapshot  °

## 2021-08-05 ENCOUNTER — Telehealth (HOSPITAL_COMMUNITY): Payer: Self-pay

## 2021-08-05 ENCOUNTER — Other Ambulatory Visit (HOSPITAL_BASED_OUTPATIENT_CLINIC_OR_DEPARTMENT_OTHER): Payer: Self-pay

## 2021-08-05 MED ORDER — TORSEMIDE 20 MG PO TABS
20.0000 mg | ORAL_TABLET | Freq: Every day | ORAL | 4 refills | Status: DC | PRN
  Filled 2021-08-05: qty 30, 30d supply, fill #0

## 2021-08-05 NOTE — Telephone Encounter (Signed)
-----   Message from Jacklynn Ganong, [place] sent at 07/29/2021  1:16 PM EST ----- Labs stable. Please change torsemide to 20 mg daily PRN weight gain/edema

## 2021-08-05 NOTE — Telephone Encounter (Signed)
Pt aware of results and recommendations. Torsemide changed to as needed. Verbalized understanding.

## 2021-08-07 ENCOUNTER — Other Ambulatory Visit (HOSPITAL_BASED_OUTPATIENT_CLINIC_OR_DEPARTMENT_OTHER): Payer: Self-pay

## 2021-08-11 ENCOUNTER — Other Ambulatory Visit (HOSPITAL_COMMUNITY): Payer: Self-pay | Admitting: Family Medicine

## 2021-08-11 ENCOUNTER — Encounter (HOSPITAL_COMMUNITY)
Admission: RE | Disposition: A | Discharge status: Home / Self Care | Payer: Self-pay | Source: Ambulatory Visit | Attending: Cardiology

## 2021-08-11 ENCOUNTER — Other Ambulatory Visit (HOSPITAL_BASED_OUTPATIENT_CLINIC_OR_DEPARTMENT_OTHER): Payer: Self-pay

## 2021-08-11 ENCOUNTER — Encounter (HOSPITAL_COMMUNITY): Payer: Self-pay | Admitting: Cardiology

## 2021-08-11 ENCOUNTER — Other Ambulatory Visit: Payer: Self-pay

## 2021-08-11 ENCOUNTER — Ambulatory Visit (HOSPITAL_COMMUNITY)
Admission: RE | Admit: 2021-08-11 | Discharge: 2021-08-11 | Disposition: A | Discharge status: Home / Self Care | Payer: 59 | Source: Ambulatory Visit | Attending: Cardiology | Admitting: Cardiology

## 2021-08-11 DIAGNOSIS — I130 Hypertensive heart and chronic kidney disease with heart failure and stage 1 through stage 4 chronic kidney disease, or unspecified chronic kidney disease: Secondary | ICD-10-CM | POA: Diagnosis present

## 2021-08-11 DIAGNOSIS — Z87891 Personal history of nicotine dependence: Secondary | ICD-10-CM | POA: Diagnosis not present

## 2021-08-11 DIAGNOSIS — I428 Other cardiomyopathies: Secondary | ICD-10-CM | POA: Diagnosis not present

## 2021-08-11 DIAGNOSIS — Z7984 Long term (current) use of oral hypoglycemic drugs: Secondary | ICD-10-CM | POA: Diagnosis not present

## 2021-08-11 DIAGNOSIS — I2582 Chronic total occlusion of coronary artery: Secondary | ICD-10-CM | POA: Insufficient documentation

## 2021-08-11 DIAGNOSIS — I272 Pulmonary hypertension, unspecified: Secondary | ICD-10-CM | POA: Diagnosis not present

## 2021-08-11 DIAGNOSIS — N1832 Chronic kidney disease, stage 3b: Secondary | ICD-10-CM | POA: Diagnosis not present

## 2021-08-11 DIAGNOSIS — E1122 Type 2 diabetes mellitus with diabetic chronic kidney disease: Secondary | ICD-10-CM | POA: Diagnosis not present

## 2021-08-11 DIAGNOSIS — I2510 Atherosclerotic heart disease of native coronary artery without angina pectoris: Secondary | ICD-10-CM | POA: Diagnosis not present

## 2021-08-11 DIAGNOSIS — I5042 Chronic combined systolic (congestive) and diastolic (congestive) heart failure: Secondary | ICD-10-CM | POA: Insufficient documentation

## 2021-08-11 DIAGNOSIS — I340 Nonrheumatic mitral (valve) insufficiency: Secondary | ICD-10-CM | POA: Insufficient documentation

## 2021-08-11 DIAGNOSIS — Z79899 Other long term (current) drug therapy: Secondary | ICD-10-CM | POA: Insufficient documentation

## 2021-08-11 HISTORY — PX: RIGHT HEART CATH: CATH118263

## 2021-08-11 LAB — CBC
HCT: 40.3 % (ref 39.0–52.0)
Hemoglobin: 13.3 g/dL (ref 13.0–17.0)
MCH: 28.4 pg (ref 26.0–34.0)
MCHC: 33 g/dL (ref 30.0–36.0)
MCV: 85.9 fL (ref 80.0–100.0)
Platelets: 277 10*3/uL (ref 150–400)
RBC: 4.69 MIL/uL (ref 4.22–5.81)
RDW: 12.6 % (ref 11.5–15.5)
WBC: 6.2 10*3/uL (ref 4.0–10.5)
nRBC: 0 % (ref 0.0–0.2)

## 2021-08-11 LAB — POCT I-STAT, CHEM 8
BUN: 27 mg/dL — ABNORMAL HIGH (ref 8–23)
Calcium, Ion: 1.17 mmol/L (ref 1.15–1.40)
Chloride: 110 mmol/L (ref 98–111)
Creatinine, Ser: 2.1 mg/dL — ABNORMAL HIGH (ref 0.61–1.24)
Glucose, Bld: 133 mg/dL — ABNORMAL HIGH (ref 70–99)
HCT: 37 % — ABNORMAL LOW (ref 39.0–52.0)
Hemoglobin: 12.6 g/dL — ABNORMAL LOW (ref 13.0–17.0)
Potassium: 4.4 mmol/L (ref 3.5–5.1)
Sodium: 144 mmol/L (ref 135–145)
TCO2: 23 mmol/L (ref 22–32)

## 2021-08-11 LAB — POCT I-STAT EG7
Acid-base deficit: 2 mmol/L (ref 0.0–2.0)
Acid-base deficit: 2 mmol/L (ref 0.0–2.0)
Bicarbonate: 23 mmol/L (ref 20.0–28.0)
Bicarbonate: 23.5 mmol/L (ref 20.0–28.0)
Calcium, Ion: 1.21 mmol/L (ref 1.15–1.40)
Calcium, Ion: 1.26 mmol/L (ref 1.15–1.40)
HCT: 37 % — ABNORMAL LOW (ref 39.0–52.0)
HCT: 38 % — ABNORMAL LOW (ref 39.0–52.0)
Hemoglobin: 12.6 g/dL — ABNORMAL LOW (ref 13.0–17.0)
Hemoglobin: 12.9 g/dL — ABNORMAL LOW (ref 13.0–17.0)
O2 Saturation: 67 %
O2 Saturation: 71 %
Potassium: 4.5 mmol/L (ref 3.5–5.1)
Potassium: 4.6 mmol/L (ref 3.5–5.1)
Sodium: 144 mmol/L (ref 135–145)
Sodium: 144 mmol/L (ref 135–145)
TCO2: 24 mmol/L (ref 22–32)
TCO2: 25 mmol/L (ref 22–32)
pCO2, Ven: 39.6 mmHg — ABNORMAL LOW (ref 44.0–60.0)
pCO2, Ven: 40.7 mmHg — ABNORMAL LOW (ref 44.0–60.0)
pH, Ven: 7.37 (ref 7.250–7.430)
pH, Ven: 7.373 (ref 7.250–7.430)
pO2, Ven: 36 mmHg (ref 32.0–45.0)
pO2, Ven: 38 mmHg (ref 32.0–45.0)

## 2021-08-11 LAB — GLUCOSE, CAPILLARY: Glucose-Capillary: 172 mg/dL — ABNORMAL HIGH (ref 70–99)

## 2021-08-11 SURGERY — RIGHT HEART CATH
Anesthesia: LOCAL

## 2021-08-11 MED ORDER — ASPIRIN 81 MG PO CHEW
81.0000 mg | CHEWABLE_TABLET | Freq: Every day | ORAL | 11 refills | Status: DC
  Filled 2021-08-11: qty 90, 90d supply, fill #0

## 2021-08-11 MED ORDER — ACETAMINOPHEN 325 MG PO TABS: 650.0000 mg | ORAL_TABLET | ORAL | Status: DC | PRN

## 2021-08-11 MED ORDER — VERAPAMIL HCL 2.5 MG/ML IV SOLN
INTRAVENOUS | Status: DC | PRN
  Administered 2021-08-11: 10 mL via INTRA_ARTERIAL

## 2021-08-11 MED ORDER — MIDAZOLAM HCL 2 MG/2ML IJ SOLN
INTRAMUSCULAR | Status: AC
  Filled 2021-08-11: qty 2

## 2021-08-11 MED ORDER — ONDANSETRON HCL 4 MG/2ML IJ SOLN: 4.0000 mg | Freq: Four times a day (QID) | INTRAMUSCULAR | Status: DC | PRN

## 2021-08-11 MED ORDER — HEPARIN SODIUM (PORCINE) 1000 UNIT/ML IJ SOLN
INTRAMUSCULAR | Status: DC | PRN
  Administered 2021-08-11: 5000 [IU] via INTRAVENOUS

## 2021-08-11 MED ORDER — IOHEXOL 350 MG/ML SOLN
INTRAVENOUS | Status: DC | PRN
  Administered 2021-08-11: 80 mL via INTRACARDIAC

## 2021-08-11 MED ORDER — SODIUM CHLORIDE 0.9 % IV SOLN: 250.0000 mL | INTRAVENOUS | Status: DC | PRN

## 2021-08-11 MED ORDER — FENTANYL CITRATE (PF) 100 MCG/2ML IJ SOLN
INTRAMUSCULAR | Status: DC | PRN
  Administered 2021-08-11: 25 ug via INTRAVENOUS

## 2021-08-11 MED ORDER — HYDRALAZINE HCL 20 MG/ML IJ SOLN
INTRAMUSCULAR | Status: DC | PRN
  Administered 2021-08-11: 10 mg via INTRAVENOUS

## 2021-08-11 MED ORDER — HEPARIN (PORCINE) IN NACL 1000-0.9 UT/500ML-% IV SOLN
INTRAVENOUS | Status: AC
  Filled 2021-08-11: qty 500

## 2021-08-11 MED ORDER — HEPARIN (PORCINE) IN NACL 1000-0.9 UT/500ML-% IV SOLN
INTRAVENOUS | Status: DC | PRN
  Administered 2021-08-11 (×3): 500 mL

## 2021-08-11 MED ORDER — SODIUM CHLORIDE 0.9 % IV SOLN: INTRAVENOUS | Status: DC

## 2021-08-11 MED ORDER — HYDRALAZINE HCL 20 MG/ML IJ SOLN: 10.0000 mg | INTRAMUSCULAR | Status: DC | PRN

## 2021-08-11 MED ORDER — ASPIRIN 81 MG PO CHEW
81.0000 mg | CHEWABLE_TABLET | ORAL | Status: AC
  Administered 2021-08-11: 81 mg via ORAL
  Filled 2021-08-11: qty 1

## 2021-08-11 MED ORDER — SODIUM CHLORIDE 0.9% FLUSH: 3.0000 mL | Freq: Two times a day (BID) | INTRAVENOUS | Status: DC

## 2021-08-11 MED ORDER — HYDRALAZINE HCL 20 MG/ML IJ SOLN
INTRAMUSCULAR | Status: AC
  Filled 2021-08-11: qty 1

## 2021-08-11 MED ORDER — SODIUM CHLORIDE 0.9 % IV SOLN
INTRAVENOUS | Status: DC | PRN
  Administered 2021-08-11: 75 mL/h via INTRAVENOUS

## 2021-08-11 MED ORDER — LIDOCAINE HCL (PF) 1 % IJ SOLN
INTRAMUSCULAR | Status: DC | PRN
  Administered 2021-08-11 (×2): 5 mL

## 2021-08-11 MED ORDER — ATORVASTATIN CALCIUM 20 MG PO TABS
80.0000 mg | ORAL_TABLET | Freq: Every day | ORAL | 6 refills | Status: DC
  Filled 2021-08-11: qty 120, 30d supply, fill #0

## 2021-08-11 MED ORDER — TORSEMIDE 20 MG PO TABS
20.0000 mg | ORAL_TABLET | Freq: Every day | ORAL | 4 refills | Status: DC
  Filled 2021-08-11: qty 30, 30d supply, fill #0

## 2021-08-11 MED ORDER — LIDOCAINE HCL (PF) 1 % IJ SOLN
INTRAMUSCULAR | Status: AC
  Filled 2021-08-11: qty 30

## 2021-08-11 MED ORDER — SODIUM CHLORIDE 0.9% FLUSH: 3.0000 mL | INTRAVENOUS | Status: DC | PRN

## 2021-08-11 MED ORDER — FENTANYL CITRATE (PF) 100 MCG/2ML IJ SOLN
INTRAMUSCULAR | Status: AC
  Filled 2021-08-11: qty 2

## 2021-08-11 MED ORDER — METFORMIN HCL ER 500 MG PO TB24
500.0000 mg | ORAL_TABLET | Freq: Two times a day (BID) | ORAL | 1 refills | Status: DC
  Filled 2021-08-11: qty 60, 30d supply, fill #0
  Filled 2021-12-04: qty 60, 30d supply, fill #1
  Filled 2022-04-02 – 2022-04-30 (×2): qty 60, 30d supply, fill #2

## 2021-08-11 MED ORDER — VERAPAMIL HCL 2.5 MG/ML IV SOLN
INTRAVENOUS | Status: AC
  Filled 2021-08-11: qty 2

## 2021-08-11 MED ORDER — LABETALOL HCL 5 MG/ML IV SOLN: 10.0000 mg | INTRAVENOUS | Status: DC | PRN

## 2021-08-11 MED ORDER — MIDAZOLAM HCL 2 MG/2ML IJ SOLN
INTRAMUSCULAR | Status: DC | PRN
  Administered 2021-08-11: 1 mg via INTRAVENOUS

## 2021-08-11 MED ORDER — HEPARIN SODIUM (PORCINE) 1000 UNIT/ML IJ SOLN
INTRAMUSCULAR | Status: AC
  Filled 2021-08-11: qty 10

## 2021-08-11 SURGICAL SUPPLY — 14 items
CATH BALLN WEDGE 5F 110CM (CATHETERS) ×1 IMPLANT
CATH INFINITI 5 FR 3DRC (CATHETERS) ×1 IMPLANT
CATH INFINITI 5 FR JL3.5 (CATHETERS) ×1 IMPLANT
CATH INFINITI JR4 5F (CATHETERS) ×1 IMPLANT
CATH LAUNCHER 5F JL3 (CATHETERS) IMPLANT
CATHETER LAUNCHER 5F JL3 (CATHETERS) ×2
DEVICE RAD COMP TR BAND LRG (VASCULAR PRODUCTS) ×1 IMPLANT
GLIDESHEATH SLEND SS 6F .021 (SHEATH) ×1 IMPLANT
GUIDEWIRE INQWIRE 1.5J.035X260 (WIRE) IMPLANT
INQWIRE 1.5J .035X260CM (WIRE) ×2
KIT HEART LEFT (KITS) ×2 IMPLANT
PACK CARDIAC CATHETERIZATION (CUSTOM PROCEDURE TRAY) ×2 IMPLANT
SHEATH GLIDE SLENDER 4/5FR (SHEATH) ×1 IMPLANT
TRANSDUCER W/STOPCOCK (MISCELLANEOUS) ×2 IMPLANT

## 2021-08-11 NOTE — Discharge Instructions (Signed)
1. Increase atorvastatin to 80 mg daily 2. Take aspirin 81 mg daily 3. Take torsemide 20 mg daily, start Tuesday.  4. Start back on Metformin on Wednesday.

## 2021-08-11 NOTE — Interval H&P Note (Signed)
History and Physical Interval Note:  08/11/2021 7:50 AM  Rodney Schultz  has presented today for surgery, with the diagnosis of heart failure.  The various methods of treatment have been discussed with the patient and family. After consideration of risks, benefits and other options for treatment, the patient has consented to  Procedure(s): RIGHT HEART CATH (N/A) as a surgical intervention.  The patient's history has been reviewed, patient examined, no change in status, stable for surgery.  I have reviewed the patient's chart and labs.  Questions were answered to the patient's satisfaction.     Fareed Fung [employer]

## 2021-08-12 ENCOUNTER — Encounter (HOSPITAL_COMMUNITY): Payer: 59

## 2021-08-25 ENCOUNTER — Other Ambulatory Visit (HOSPITAL_BASED_OUTPATIENT_CLINIC_OR_DEPARTMENT_OTHER): Payer: Self-pay

## 2021-08-25 ENCOUNTER — Other Ambulatory Visit (HOSPITAL_COMMUNITY): Payer: Self-pay

## 2021-08-25 ENCOUNTER — Encounter (HOSPITAL_BASED_OUTPATIENT_CLINIC_OR_DEPARTMENT_OTHER): Payer: Self-pay | Admitting: Pharmacist

## 2021-08-25 NOTE — Progress Notes (Signed)
ADVANCED HF CLINIC NOTE   Primary Care: Pcp, No Primary Cardiologist: Dr. Irish Lack HF Cardiologist: Dr. Aundra Dubin  HPI: Rodney Schultz is a 61 y.o. AAM w/ h/o HTN, T2DM and HLD, recently admitted to [facility] w/ new systolic heart failure.    He reports long history of poorly controlled HTN in the past. Baseline SBPs would average in the 170s. Denies ETOH use. Former smoker but recently quit. No known family h/o CHF. No h/o ischemic like CP. No known h/o COVID but reports having the flu ~1 month ago.    Admitted 11/22 w/ new CHF, hypertensive + AKI w/ SCr in the 2.0 range (previous baseline ~1.5). Echo w/ severe biventricular dysfunction. LVEF 20-25%, mod LVH, GIIIDD (restrictive), RV severely reduced. Moderate MR. Diffuse HK. R/LHC was deferred due to renal insuffiency. He was diuresed w/ IV Lasix. GDMT w/ hydralazine, Imdur and Coreg initiated. Dischaged wt 177 lb. Referred to TOC.    Seen in TOC. GDMT titrated, plan for [facility] +/- cMRI pending renal panel. Referred to Nephrology.  Seen 1/23 to establish care for his CHF. R/LHC arranged to better assess hemodynamics. SCr remained elevated.  R/LHC showed mildly elevated PCWP, pulmonary venous hypertension, preserved CO and diffuse CAD w/ occluded large ramus and moderate D1. 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 No good PCI options and he does not have good targets for CABG (would be high risk CABG with low EF).  Medically manage.  Today he returns for post-cath HF follow up. He feels great. He does not have exertional dyspnea and took a long walk today without any issues. Right wrist is a little sore from cath, no numbness or weakness. Denies palpitations, abnormal bleeding, CP, dizziness, edema, or PND/Orthopnea. Appetite ok. No fever or chills. Weight at home 185 pounds. Works for a [employer], clinical. Requires heavy lifting. BP at home 130s at home. Wife says he snores and he has daytime fatigue. Wife does most of cooking and  limits salt; eats a lot of oatmeal, cheerios and tossed salad.  ECG (personally reviewed): none ordered today.  Labs (1/23): K 4.3, creatinine 1.95  Cardiac Testing  - Echo (11/22): EF 20-25%, severe LV dysfunction with global HK, moderate LVH, grade III DD (restrictive), RV severely reduced, moderate MR   - RHC (1/23): mildly elevated PCWP, pulmonary venous hypertension, preserved CO and diffuse CAD w/ occluded large ramus and moderate D1. 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 No good PCI options and he does not have good targets for CABG (would be high risk CABG with low EF).  Past Medical History:  Diagnosis Date   Diabetes mellitus without complication (HCC)    GERD (gastroesophageal reflux disease)    Hypertension    Current Outpatient Medications  Medication Sig Dispense Refill   aspirin (ASPIRIN CHILDRENS) 81 MG chewable tablet Chew 1 tablet (81 mg total) by mouth daily. 36 tablet 11   atorvastatin (LIPITOR) 20 MG tablet Take 4 tablets (80 mg total) by mouth daily. 120 tablet 6   carvedilol (COREG) 6.25 MG tablet Take 1 tablet (6.25 mg total) by mouth 2 (two) times daily with a meal. 60 tablet 4   empagliflozin (JARDIANCE) 10 MG TABS tablet Take 1 tablet (10 mg total) by mouth daily before breakfast. 30 tablet 4   glipiZIDE (GLUCOTROL) 10 MG tablet Take 1 tablet (10 mg total) by mouth daily before breakfast. 90 tablet 1   hydrALAZINE (APRESOLINE) 50 MG tablet Take 1 tablet (50  mg total) by mouth every 8 (eight) hours. 90 tablet 4   isosorbide mononitrate (IMDUR) 30 MG 24 hr tablet Take 1 tablet (30 mg total) by mouth daily. 30 tablet 4   metFORMIN (GLUCOPHAGE-XR) 500 MG 24 hr tablet Take 1 tablet (500 mg total) by mouth 2 (two) times daily. 180 tablet 1   torsemide (DEMADEX) 20 MG tablet Take 1 tablet (20 mg total) by mouth daily. For swelling or weight gain (Patient taking differently: Take 20 mg by mouth daily as needed. For swelling or weight gain) 30  tablet 4   No current facility-administered medications for this encounter.   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 Pt smoked a pack a week  Vaping Use   Vaping Use: Never used  Substance and Sexual Activity   Alcohol use: Not Currently   Drug use: Never   Sexual activity: Not on file  Other Topics Concern   Not on file  Social History Narrative   Not on file   Social Determinants of Health   Financial Resource Strain: High Risk   Difficulty of Paying Living Expenses: Hard  Food Insecurity: Food Insecurity Present   Worried About [place] in the Last Year: Never true   Ran Out of Food in the Last Year: Sometimes true  Transportation Needs: No Transportation Needs   Lack of Transportation (Medical): No   Lack of Transportation (Non-Medical): No  Physical Activity: Not on file  Stress: Not on file  Social Connections: Not on file  Intimate Partner Violence: Not on file   Family History  Problem Relation Age of Onset   Healthy Mother    BP (!) 142/98    Pulse 62    Wt 88.4 kg (194 lb 12.8 oz)    SpO2 99%    BMI 27.95 kg/m   Wt Readings from Last 3 Encounters:  08/26/21 88.4 kg (194 lb 12.8 oz)  08/11/21 82.1 kg (181 lb)  07/29/21 83.9 kg (185 lb)   PHYSICAL EXAM: General:  NAD. No resp difficulty HEENT: Normal Neck: Supple. No JVD. Carotids 2+ bilat; no bruits. No  lymphadenopathy or thryomegaly appreciated. Cor: PMI nondisplaced. Regular rate & rhythm. No rubs, gallops or murmurs. Lungs: Clear Abdomen: Soft, nontender, nondistended. No hepatosplenomegaly. No bruits or masses. Good bowel sounds. Extremities: No cyanosis, clubbing, rash, edema Neuro: Alert & oriented x 3, cranial nerves grossly intact. Moves all 4 extremities w/o difficulty. Affect pleasant.  ASSESSMENT & PLAN: 1. Chronic Combined Systolic and Diastolic Heart Failure: New diagnosis. Echo (11/22): w/ severe biventricular dysfunction, LVEF 20-25%, Mod LVH, GIIIDD (restrictive), RV severely reduced, diffuse HK. Suspected CM secondary to long-standing uncontrolled HTN and severe LVH on ECG. Underwent R/LHC (1/23) showing severe CAD, no good  targets. 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 Also ? Viral (h/o flu ~1 month prior) vs sarcoid (abnormal CXR bilateral parahilar lung opacities ? Inflammatory etiology and also noted on prior CXRs). He did not have cMRI with elevated SCr. He has improved NYHA II symptoms today. Weight up some but he is not volume overloaded on exam. - Continue Jardiance 10 mg daily. BMET today. - Continue carvedilol 6.25 mg bid.  - Continue torsemide 20 mg daily. - Increase hydralazine to 75 mg tid  - Continue Imdur 30 mg daily. - no ARNi/ARB, spiro, dig given CKD. May need advanced therapies if EF does not improve w/ medical therapy/ BP control. W/ severe RV dysfunction and SCr >2, would be poor VAD candidate. Will need to be tobacco free for > 6 months before being considered for possible transplant (quit 11/22).   2. CAD: LHC (1/23) with diffuse disease occluded large ramus and moderate D1. 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 No good PCI options and he does not have good targets for CABG (would be high risk CABG with low EF).  Medically manage. No chest pain. - Continue ASA + statin. 3. CKD, Stage IIIb:  Baseline SCr ~1.5 (in April 22).  - Refer to  Nephrology. - BMET today, follow renal function closely. 4. Hypertension: Long history of poor control. Mildly elevated today. - Increase hydralazine as above. 5. Mitral Regurgitation: Moderate on recent echo, likely functional.   6. Type 2DM: Controlled, Hgb A1c 6.9. Followed by PCP. Continue SGLT2i.  7. Tobacco Use: Former smoker, recently quit x 1.5 months. 8. Snoring/daytime fatigue: Arrange for home sleep study.  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 I do not think that he can return in full capacity yet. Will need to see how he does over the next several weeks tolerating his medications. We discussed this and he is agreeable. Disability papers given to [facility] today. He should remain out of work until at least next appointment.   Follow up with Dr. Aundra Dubin + echo in 2 months as scheduled.  Allena Katz, FNP-BC 08/26/21

## 2021-08-26 ENCOUNTER — Other Ambulatory Visit: Payer: Self-pay

## 2021-08-26 ENCOUNTER — Encounter (HOSPITAL_COMMUNITY): Payer: Self-pay

## 2021-08-26 ENCOUNTER — Other Ambulatory Visit (HOSPITAL_COMMUNITY): Payer: Self-pay

## 2021-08-26 ENCOUNTER — Other Ambulatory Visit (HOSPITAL_BASED_OUTPATIENT_CLINIC_OR_DEPARTMENT_OTHER): Payer: Self-pay

## 2021-08-26 ENCOUNTER — Ambulatory Visit (HOSPITAL_COMMUNITY)
Admission: RE | Admit: 2021-08-26 | Discharge: 2021-08-26 | Disposition: A | Discharge status: Home / Self Care | Payer: 59 | Source: Ambulatory Visit | Attending: Family Medicine | Admitting: Family Medicine

## 2021-08-26 ENCOUNTER — Telehealth (HOSPITAL_COMMUNITY): Payer: Self-pay | Admitting: Cardiology

## 2021-08-26 VITALS — BP 142/98 | HR 62 | Wt 194.8 lb

## 2021-08-26 DIAGNOSIS — Z7984 Long term (current) use of oral hypoglycemic drugs: Secondary | ICD-10-CM | POA: Diagnosis not present

## 2021-08-26 DIAGNOSIS — R0683 Snoring: Secondary | ICD-10-CM | POA: Diagnosis not present

## 2021-08-26 DIAGNOSIS — I130 Hypertensive heart and chronic kidney disease with heart failure and stage 1 through stage 4 chronic kidney disease, or unspecified chronic kidney disease: Secondary | ICD-10-CM | POA: Insufficient documentation

## 2021-08-26 DIAGNOSIS — I428 Other cardiomyopathies: Secondary | ICD-10-CM | POA: Diagnosis not present

## 2021-08-26 DIAGNOSIS — Z87891 Personal history of nicotine dependence: Secondary | ICD-10-CM | POA: Diagnosis not present

## 2021-08-26 DIAGNOSIS — Z79899 Other long term (current) drug therapy: Secondary | ICD-10-CM | POA: Insufficient documentation

## 2021-08-26 DIAGNOSIS — I2510 Atherosclerotic heart disease of native coronary artery without angina pectoris: Secondary | ICD-10-CM | POA: Insufficient documentation

## 2021-08-26 DIAGNOSIS — E785 Hyperlipidemia, unspecified: Secondary | ICD-10-CM | POA: Insufficient documentation

## 2021-08-26 DIAGNOSIS — I340 Nonrheumatic mitral (valve) insufficiency: Secondary | ICD-10-CM | POA: Insufficient documentation

## 2021-08-26 DIAGNOSIS — N1832 Chronic kidney disease, stage 3b: Secondary | ICD-10-CM | POA: Diagnosis not present

## 2021-08-26 DIAGNOSIS — E1122 Type 2 diabetes mellitus with diabetic chronic kidney disease: Secondary | ICD-10-CM | POA: Diagnosis not present

## 2021-08-26 DIAGNOSIS — R5383 Other fatigue: Secondary | ICD-10-CM | POA: Insufficient documentation

## 2021-08-26 DIAGNOSIS — I272 Pulmonary hypertension, unspecified: Secondary | ICD-10-CM | POA: Insufficient documentation

## 2021-08-26 DIAGNOSIS — Z7982 Long term (current) use of aspirin: Secondary | ICD-10-CM | POA: Diagnosis not present

## 2021-08-26 DIAGNOSIS — I5042 Chronic combined systolic (congestive) and diastolic (congestive) heart failure: Secondary | ICD-10-CM | POA: Diagnosis not present

## 2021-08-26 DIAGNOSIS — I10 Essential (primary) hypertension: Secondary | ICD-10-CM | POA: Diagnosis not present

## 2021-08-26 LAB — BASIC METABOLIC PANEL
Anion gap: 10 (ref 5–15)
BUN: 30 mg/dL — ABNORMAL HIGH (ref 8–23)
CO2: 25 mmol/L (ref 22–32)
Calcium: 9.3 mg/dL (ref 8.9–10.3)
Chloride: 107 mmol/L (ref 98–111)
Creatinine, Ser: 2.47 mg/dL — ABNORMAL HIGH (ref 0.61–1.24)
GFR, Estimated: 29 mL/min — ABNORMAL LOW (ref >60)
Glucose, Bld: 103 mg/dL — ABNORMAL HIGH (ref 70–99)
Potassium: 4.3 mmol/L (ref 3.5–5.1)
Sodium: 142 mmol/L (ref 135–145)

## 2021-08-26 MED ORDER — HYDRALAZINE HCL 50 MG PO TABS
50.0000 mg | ORAL_TABLET | Freq: Three times a day (TID) | ORAL | 4 refills | Status: DC
  Filled 2021-08-26: qty 90, 30d supply, fill #0

## 2021-08-26 MED ORDER — TORSEMIDE 20 MG PO TABS
20.0000 mg | ORAL_TABLET | Freq: Every day | ORAL | 11 refills | Status: DC
  Filled 2021-08-26: qty 30, 30d supply, fill #0

## 2021-08-26 MED ORDER — HYDRALAZINE HCL 50 MG PO TABS
75.0000 mg | ORAL_TABLET | Freq: Three times a day (TID) | ORAL | 4 refills | Status: DC
  Filled 2021-08-26: qty 120, 27d supply, fill #0

## 2021-08-26 NOTE — Addendum Note (Signed)
Encounter addended by: Kerry Dory, CMA on: 08/26/2021 3:29 PM  Actions taken: Clinical Note Signed, Order list changed

## 2021-08-26 NOTE — Patient Instructions (Signed)
INCREASE Hydralazine to 75 mg, one tab three times per day  Labs today We will only contact you if something comes back abnormal or we need to make some changes. Otherwise no news is good news!  Your provider has recommended that you have a home sleep study.  This has to be approved by your insurance company. We will schedule you an appointment to pick up the equipment to give [place] time to complete this authorization. Once you have the equipment you will download the app on your phone and follow the instructions. YOUR PIN NUMBER IS: [id number]. Once you have completed the test the information is sent to the company through [employer] and you can dispose of the equipment. If your test is positive you will receive a call from Dr Norris Cross office [facility]) to set up your CPAP equipment.  Keep cardiology follow up as scheduled  Do the following things EVERYDAY: Weigh yourself in the morning before breakfast. Write it down and keep it in a log. Take your medicines as prescribed Eat low salt foods--Limit salt (sodium) to 2000 mg per day.  Stay as active as you can everyday Limit all fluids for the day to less than 2 liters  At the [facility], you and your health needs are our priority. As part of our continuing mission to provide you with exceptional heart care, we have created designated Provider Care Teams. These Care Teams include your primary Cardiologist (physician) and Advanced Practice Providers (APPs- Physician Assistants and Nurse Practitioners) who all work together to provide you with the care you need, when you need it.   You may see any of the following providers on your designated Care Team at your next follow up: Dr Arvilla Meres Dr Carron Curie, NP Robbie Lis, [place] [facility] [place], [place] Karle Plumber, PharmD   Please be sure to bring in all your medications bottles to every appointment.   If you have any questions or  concerns before your next appointment please send [place] a message through [place] or call our office at [phone].    TO LEAVE A MESSAGE FOR THE NURSE SELECT OPTION 2, PLEASE LEAVE A MESSAGE INCLUDING: YOUR NAME DATE OF BIRTH CALL BACK NUMBER REASON FOR CALL**this is important as we prioritize the call backs  YOU WILL RECEIVE A CALL BACK THE SAME DAY AS LONG AS YOU CALL BEFORE 4:00 PM

## 2021-08-26 NOTE — Telephone Encounter (Signed)
Outside referral to [facility] faxed to [phone] °Fax included most recent OV, H&P, labs, renal u/s and demographics  °

## 2021-08-26 NOTE — Progress Notes (Signed)
Patient Name: Rodney Schultz        DOB: 10-05-1960      Height:  5'10"    Weight: 194lb  Office Name: [facility]         Referring Provider: Loralie Champagne, MD/ Allena Katz, NP  Today's Date: 08/26/2021   STOP BANG RISK ASSESSMENT S (snore) Have you been told that you snore?     YES   T (tired) Are you often tired, fatigued, or sleepy during the day?   NO  O (obstruction) Do you stop breathing, choke, or gasp during sleep? NO   P (pressure) Do you have or are you being treated for high blood pressure? YES   B (BMI) Is your body index greater than 35 kg/m? NO   A (age) Are you 62 years old or older? YES   N (neck) Do you have a neck circumference greater than 16 inches?   NO   G (gender) Are you a male? YES   TOTAL STOP/BANG YES ANSWERS                                                                        For Office Use Only              Procedure Order Form    YES to 3+ Stop Bang questions OR two clinical symptoms - patient qualifies for WatchPAT (CPT [id number])             Clinical Notes: Will consult Sleep Specialist and refer for management of therapy due to patient increased risk of Sleep Apnea. Ordering a sleep study due to the following two clinical symptoms: Excessive daytime sleepiness G47.10 / Gastroesophageal reflux K21.9 / Nocturia R35.1 / Morning Headaches G44.221 / Difficulty concentrating R41.840 / Memory problems or poor judgment G31.84 / Personality changes or irritability R45.4 / Loud snoring R06.83 / Depression F32.9 / Unrefreshed by sleep G47.8 / Impotence N52.9 / History of high blood pressure R03.0 / Insomnia G47.00    I understand that I am proceeding with a home sleep apnea test as ordered by my treating physician. I understand that untreated sleep apnea is a serious cardiovascular risk factor and it is my responsibility to perform the test and seek management for sleep apnea. I will be contacted with the results and be managed for  sleep apnea by a local sleep physician. I will be receiving equipment and further instructions from [facility]. I shall promptly ship back the equipment via the included mailing label. I understand my insurance will be billed for the test and as the patient I am responsible for any insurance related out-of-pocket costs incurred. I have been provided with written instructions and can call for additional video or telephonic instruction, with 24-hour availability of qualified personnel to answer any questions: Patient Help Desk [phone].  Patient Signature ______________________________________________________   Date______________________ Patient Telemedicine Verbal Consent

## 2021-08-27 ENCOUNTER — Telehealth (HOSPITAL_COMMUNITY): Payer: Self-pay | Admitting: Surgery

## 2021-08-27 DIAGNOSIS — I5042 Chronic combined systolic (congestive) and diastolic (congestive) heart failure: Secondary | ICD-10-CM

## 2021-08-27 NOTE — Telephone Encounter (Signed)
Patient called to review results and recommendations per Allena Katz NP.  He will return in 2 weeks for repeat labwork.

## 2021-08-27 NOTE — Telephone Encounter (Signed)
-----   Message from Jacklynn Ganong, [place] sent at 08/27/2021  7:59 AM EST ----- Kidney function remains mildly elevated above baseline. No med changes for now but will need a repeat BMET in 2 weeks to follow.

## 2021-09-03 ENCOUNTER — Encounter (HOSPITAL_COMMUNITY): Payer: Self-pay | Admitting: *Deleted

## 2021-09-03 NOTE — Progress Notes (Signed)
Pt's STD forms completed, signed by Dr Shirlee Latch, and faxed along with last OV note to [facility] at [phone]. Pt is aware this was done

## 2021-09-10 ENCOUNTER — Telehealth (HOSPITAL_COMMUNITY): Payer: Self-pay | Admitting: *Deleted

## 2021-09-10 ENCOUNTER — Other Ambulatory Visit (HOSPITAL_BASED_OUTPATIENT_CLINIC_OR_DEPARTMENT_OTHER): Payer: Self-pay

## 2021-09-10 ENCOUNTER — Other Ambulatory Visit (HOSPITAL_COMMUNITY): Payer: 59

## 2021-09-10 NOTE — Telephone Encounter (Signed)
No pre cert reqd for home sleep study cpt code 95800 ° ° ° °

## 2021-09-11 ENCOUNTER — Ambulatory Visit (HOSPITAL_COMMUNITY)
Admission: RE | Admit: 2021-09-11 | Discharge: 2021-09-11 | Disposition: A | Discharge status: Home / Self Care | Payer: 59 | Source: Ambulatory Visit | Attending: Internal Medicine | Admitting: Internal Medicine

## 2021-09-11 ENCOUNTER — Encounter (INDEPENDENT_AMBULATORY_CARE_PROVIDER_SITE_OTHER): Payer: 59 | Admitting: Cardiology

## 2021-09-11 ENCOUNTER — Telehealth (HOSPITAL_COMMUNITY): Payer: Self-pay | Admitting: Surgery

## 2021-09-11 ENCOUNTER — Other Ambulatory Visit: Payer: Self-pay

## 2021-09-11 DIAGNOSIS — I5042 Chronic combined systolic (congestive) and diastolic (congestive) heart failure: Secondary | ICD-10-CM | POA: Diagnosis present

## 2021-09-11 DIAGNOSIS — G4733 Obstructive sleep apnea (adult) (pediatric): Secondary | ICD-10-CM

## 2021-09-11 LAB — BASIC METABOLIC PANEL
Anion gap: 9 (ref 5–15)
BUN: 21 mg/dL (ref 8–23)
CO2: 23 mmol/L (ref 22–32)
Calcium: 8.9 mg/dL (ref 8.9–10.3)
Chloride: 106 mmol/L (ref 98–111)
Creatinine, Ser: 1.75 mg/dL — ABNORMAL HIGH (ref 0.61–1.24)
GFR, Estimated: 43 mL/min — ABNORMAL LOW (ref >60)
Glucose, Bld: 139 mg/dL — ABNORMAL HIGH (ref 70–99)
Potassium: 4.4 mmol/L (ref 3.5–5.1)
Sodium: 138 mmol/L (ref 135–145)

## 2021-09-11 NOTE — Telephone Encounter (Signed)
I called patient to inform that it was okay to proceed with ordered home sleep study and that insurance prior authorization is not required per Clinic CMA. I spoke with his wife and she will really the message.  I asked that she have him return the call with any questions.

## 2021-09-12 NOTE — Procedures (Signed)
° °  Sleep Study Report  Patient Information Study Date: 09/11/21 Patient Name: Rodney Schultz Patient ID: [id number] Birth Date: [birth year] Age: 61 Gender: Male Referring Physician: Marca Ancona, MD  TEST DESCRIPTION: Home sleep apnea testing was completed using the WatchPat, a Type 1 device, utilizing peripheral arterial tonometry (PAT), chest movement, actigraphy, pulse oximetry, pulse rate, body position and snore. AHI was calculated with apnea and hypopnea using valid sleep time as the denominator. RDI includes apneas, hypopneas, and RERAs. The data acquired and the scoring of sleep and all associated events were performed in accordance with the recommended standards and specifications as outlined in the AASM Manual for the Scoring of Sleep and Associated Events 2.2.0 (2015).  FINDINGS: 1. Mild Obstructive Sleep Apnea with AHI 6.2/hr. 2. No significant Central Sleep Apnea with pAHIc 2.3/hr (18.9% of central events were Cheyne Stokes respirations). 3. Oxygen desaturations as low as 93%. 4. Mild snoring was present. O2 sats were < 88% for 0 min. 5. Total sleep time was 7 hrs and 16 min. 6. 18.6% of total sleep time was spent in REM sleep. 7. Normal sleep onset latency at 16 min. 8. Shortened REM sleep onset latency at 41 min. 9. Total awakenings were 1 .  DIAGNOSIS: Mild Obstructive Sleep Apnea (G47.33)  RECOMMENDATIONS: 1. Clinical correlation of these findings is necessary. The decision to treat obstructive sleep apnea (OSA) is usually based on the presence of apnea symptoms or the presence of associated medical conditions such as Hypertension, Congestive Heart Failure, Atrial Fibrillation or Obesity. The most common symptoms of OSA are snoring, gasping for breath while sleeping, daytime sleepiness and fatigue.  2. Initiating apnea therapy is recommended given the presence of symptoms and/or associated conditions. Recommend proceeding with one of the following:   a.  Auto-CPAP therapy with a pressure range of 5-20cm H2O.   b. An oral appliance (OA) that can be obtained from certain dentists with expertise in sleep medicine. These are primarily of use in non-obese patients with mild and moderate disease.   c. An ENT consultation which may be useful to look for specific causes of obstruction and possible treatment options.   d. If patient is intolerant to PAP therapy, consider referral to ENT for evaluation for hypoglossal nerve stimulator.  3. Close follow-up is necessary to ensure success with CPAP or oral appliance therapy for maximum benefit .  4. A follow-up oximetry study on CPAP is recommended to assess the adequacy of therapy and determine the need for supplemental oxygen or the potential need for Bi-level therapy. An arterial blood gas to determine the adequacy of baseline ventilation and oxygenation should also be considered.  5. Healthy sleep recommendations include: adequate nightly sleep (normal 7-9 hrs/night), avoidance of caffeine after noon and alcohol near bedtime, and maintaining a sleep environment that is cool, dark and quiet.  6. Weight loss for overweight patients is recommended. Even modest amounts of weight loss can significantly improve the severity of sleep apnea.  7. Snoring recommendations include: weight loss where appropriate, side sleeping, and avoidance of alcohol before bed.  8. Operation of motor vehicle should be avoided when sleepy.  Signature: Electronically Signed: 09/12/21 Armanda Magic, MD; [facility]; Diplomat, American Board of Sleep Medicine

## 2021-09-15 ENCOUNTER — Other Ambulatory Visit (HOSPITAL_COMMUNITY): Payer: 59

## 2021-09-15 ENCOUNTER — Telehealth: Payer: Self-pay | Admitting: *Deleted

## 2021-09-15 NOTE — Telephone Encounter (Signed)
Patient informed of sleep study results and recommendations. He agrees to proceed with CPAP therapy and has no questions at this time when asked. APAP  order has been sent to choice.

## 2021-09-15 NOTE — Telephone Encounter (Signed)
-----   Message from Quintella Reichert, MD sent at 09/12/2021 10:26 AM EST ----- Please let patient know that they have sleep apnea and recommend treating with CPAP.  Please order an auto CPAP from 4-15cm H2O with heated humidity and mask of choice.  Order overnight pulse ox on CPAP.  Followup with me in 6 weeks.

## 2021-09-17 ENCOUNTER — Other Ambulatory Visit: Payer: Self-pay

## 2021-09-17 DIAGNOSIS — G4733 Obstructive sleep apnea (adult) (pediatric): Secondary | ICD-10-CM

## 2021-11-03 ENCOUNTER — Ambulatory Visit (HOSPITAL_COMMUNITY)
Admission: RE | Admit: 2021-11-03 | Discharge: 2021-11-03 | Disposition: A | Discharge status: Home / Self Care | Payer: 59 | Source: Ambulatory Visit | Attending: Medical | Admitting: Medical

## 2021-11-03 ENCOUNTER — Other Ambulatory Visit (HOSPITAL_BASED_OUTPATIENT_CLINIC_OR_DEPARTMENT_OTHER): Payer: Self-pay

## 2021-11-03 ENCOUNTER — Ambulatory Visit (HOSPITAL_COMMUNITY)
Admission: RE | Admit: 2021-11-03 | Discharge: 2021-11-03 | Disposition: A | Discharge status: Home / Self Care | Payer: 59 | Source: Ambulatory Visit | Attending: Cardiology | Admitting: Cardiology

## 2021-11-03 ENCOUNTER — Ambulatory Visit (HOSPITAL_BASED_OUTPATIENT_CLINIC_OR_DEPARTMENT_OTHER)
Admission: RE | Admit: 2021-11-03 | Discharge: 2021-11-03 | Disposition: A | Discharge status: Home / Self Care | Payer: 59 | Source: Ambulatory Visit | Attending: Cardiology | Admitting: Cardiology

## 2021-11-03 ENCOUNTER — Encounter (HOSPITAL_COMMUNITY): Payer: Self-pay | Admitting: Cardiology

## 2021-11-03 ENCOUNTER — Other Ambulatory Visit (HOSPITAL_COMMUNITY): Payer: Self-pay

## 2021-11-03 ENCOUNTER — Telehealth (HOSPITAL_COMMUNITY): Payer: Self-pay | Admitting: Pharmacy Technician

## 2021-11-03 ENCOUNTER — Telehealth (HOSPITAL_COMMUNITY): Payer: Self-pay | Admitting: *Deleted

## 2021-11-03 VITALS — BP 210/110 | HR 72 | Wt 188.2 lb

## 2021-11-03 DIAGNOSIS — I5042 Chronic combined systolic (congestive) and diastolic (congestive) heart failure: Secondary | ICD-10-CM | POA: Diagnosis not present

## 2021-11-03 DIAGNOSIS — Z7984 Long term (current) use of oral hypoglycemic drugs: Secondary | ICD-10-CM | POA: Insufficient documentation

## 2021-11-03 DIAGNOSIS — R519 Headache, unspecified: Secondary | ICD-10-CM | POA: Insufficient documentation

## 2021-11-03 DIAGNOSIS — I130 Hypertensive heart and chronic kidney disease with heart failure and stage 1 through stage 4 chronic kidney disease, or unspecified chronic kidney disease: Secondary | ICD-10-CM | POA: Insufficient documentation

## 2021-11-03 DIAGNOSIS — I5041 Acute combined systolic (congestive) and diastolic (congestive) heart failure: Secondary | ICD-10-CM | POA: Insufficient documentation

## 2021-11-03 DIAGNOSIS — Z7901 Long term (current) use of anticoagulants: Secondary | ICD-10-CM | POA: Diagnosis not present

## 2021-11-03 DIAGNOSIS — I340 Nonrheumatic mitral (valve) insufficiency: Secondary | ICD-10-CM | POA: Diagnosis not present

## 2021-11-03 DIAGNOSIS — I2510 Atherosclerotic heart disease of native coronary artery without angina pectoris: Secondary | ICD-10-CM | POA: Insufficient documentation

## 2021-11-03 DIAGNOSIS — I10 Essential (primary) hypertension: Secondary | ICD-10-CM | POA: Diagnosis not present

## 2021-11-03 DIAGNOSIS — N183 Chronic kidney disease, stage 3 unspecified: Secondary | ICD-10-CM | POA: Diagnosis not present

## 2021-11-03 DIAGNOSIS — E785 Hyperlipidemia, unspecified: Secondary | ICD-10-CM

## 2021-11-03 DIAGNOSIS — Z7982 Long term (current) use of aspirin: Secondary | ICD-10-CM | POA: Diagnosis not present

## 2021-11-03 DIAGNOSIS — N1832 Chronic kidney disease, stage 3b: Secondary | ICD-10-CM

## 2021-11-03 DIAGNOSIS — I429 Cardiomyopathy, unspecified: Secondary | ICD-10-CM | POA: Diagnosis not present

## 2021-11-03 DIAGNOSIS — Z79899 Other long term (current) drug therapy: Secondary | ICD-10-CM | POA: Diagnosis not present

## 2021-11-03 DIAGNOSIS — I5022 Chronic systolic (congestive) heart failure: Secondary | ICD-10-CM | POA: Diagnosis not present

## 2021-11-03 DIAGNOSIS — E1122 Type 2 diabetes mellitus with diabetic chronic kidney disease: Secondary | ICD-10-CM | POA: Insufficient documentation

## 2021-11-03 LAB — LIPID PANEL
Cholesterol: 258 mg/dL — ABNORMAL HIGH (ref 0–200)
HDL: 31 mg/dL — ABNORMAL LOW (ref >40)
LDL Cholesterol: UNDETERMINED mg/dL (ref 0–99)
Total CHOL/HDL Ratio: 8.3 RATIO
Triglycerides: 409 mg/dL — ABNORMAL HIGH (ref <150)
VLDL: UNDETERMINED mg/dL (ref 0–40)

## 2021-11-03 LAB — ECHOCARDIOGRAM COMPLETE
AR max vel: 3.35 cm2
AV Peak grad: 7.2 mmHg
Ao pk vel: 1.34 m/s
Area-P 1/2: 2.01 cm2
S' Lateral: 4.9 cm
Single Plane A4C EF: 26.9 %

## 2021-11-03 LAB — BASIC METABOLIC PANEL
Anion gap: 8 (ref 5–15)
BUN: 22 mg/dL (ref 8–23)
CO2: 27 mmol/L (ref 22–32)
Calcium: 9.6 mg/dL (ref 8.9–10.3)
Chloride: 104 mmol/L (ref 98–111)
Creatinine, Ser: 2.27 mg/dL — ABNORMAL HIGH (ref 0.61–1.24)
GFR, Estimated: 32 mL/min — ABNORMAL LOW (ref >60)
Glucose, Bld: 377 mg/dL — ABNORMAL HIGH (ref 70–99)
Potassium: 4.4 mmol/L (ref 3.5–5.1)
Sodium: 139 mmol/L (ref 135–145)

## 2021-11-03 LAB — BRAIN NATRIURETIC PEPTIDE: B Natriuretic Peptide: 45.1 pg/mL (ref 0.0–100.0)

## 2021-11-03 LAB — LDL CHOLESTEROL, DIRECT: Direct LDL: 122.9 mg/dL — ABNORMAL HIGH (ref 0–99)

## 2021-11-03 MED ORDER — ATORVASTATIN CALCIUM 80 MG PO TABS
80.0000 mg | ORAL_TABLET | Freq: Every day | ORAL | 3 refills | Status: DC
  Filled 2021-11-03: qty 30, 30d supply, fill #0

## 2021-11-03 MED ORDER — ENTRESTO 24-26 MG PO TABS
1.0000 | ORAL_TABLET | Freq: Two times a day (BID) | ORAL | 0 refills | Status: DC
Start: 2021-11-03 — End: 2021-12-02
  Filled 2021-11-03: qty 60, 30d supply, fill #0

## 2021-11-03 MED ORDER — TORSEMIDE 20 MG PO TABS: 20.0000 mg | ORAL_TABLET | ORAL | 11 refills | Status: DC

## 2021-11-03 MED ORDER — CLONIDINE HCL 0.2 MG PO TABS
0.2000 mg | ORAL_TABLET | Freq: Once | ORAL | Status: AC
  Administered 2021-11-03: 0.2 mg via ORAL

## 2021-11-03 MED ORDER — HYDRALAZINE HCL 50 MG PO TABS: 50.0000 mg | ORAL_TABLET | Freq: Three times a day (TID) | ORAL | 4 refills | Status: DC

## 2021-11-03 NOTE — Progress Notes (Signed)
? ?ADVANCED HF CLINIC NOTE ? ? ?Primary Care: Pcp, No ?Primary Cardiologist: Dr. Irish Lack ?HF Cardiologist: Dr. Aundra Dubin ? ?HPI: ?Rodney Schultz is a 62 y.o. AAM w/ h/o HTN, T2DM and HLD, admitted to [facility] w/ new systolic heart failure in 11/22.  ?  ?He reports long history of poorly controlled HTN in the past. Baseline SBPs would average in the 170s. Denies ETOH use. Former smoker but has quit. No known family h/o CHF. No h/o ischemic-like CP. No known h/o COVID but reports having the flu prior to his 11/22 admission.   ?  ?Admitted 11/22 w/ new CHF, hypertensive + AKI w/ SCr in the 2.0 range (previous baseline ~1.5). Echo w/ severe biventricular dysfunction, LVEF 20-25% with diffuse hypokinesis, mod LVH, GIIIDD (restrictive), RV severely reduced, moderate MR. R/LHC was deferred initially due to renal insuffiency. He was diuresed w/ IV Lasix. GDMT w/ hydralazine, Imdur and Coreg initiated. Discharge wt 177 lb. Referred to TOC.  ?  ?Seen 1/23 in HF clinic to establish care for his CHF. R/LHC showed mildly elevated PCWP, pulmonary venous hypertension, preserved CO and diffuse CAD w/ occluded large ramus and moderate D1. 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 No good PCI options and he does not have good targets for CABG (would be high risk CABG with low EF).  Medically managed in setting of no anginal chest pain.  ? ?Echo was done today and reviewed, EF 25-30% with moderate LVH and normal RV, mild MR.  ? ?Patient returns for followup of CHF.  He is markedly hypertensive today, BP 210/110. At home, BP has been running 140s-150s when he checks. He has had a frontal headache continuously for days.  He dates the onset of headache to when hydralazine was increased from 50 tid to 75 tid, and he insists that hydralazine caused the headache. He also says that BP control has been worse with higher hydralazine use.  No nausea/vomiting.  He has not taken anything for the headache.  No chest pain.   Generally, he only notes shortness of breath when he push mows his yard.  No orthopnea/PND.  Weight down 6 lbs.   ? ?ECG (personally reviewed): NSR, LVH with repolarization abnormality.  ? ?Labs (1/23): K 4.3, creatinine 1.95 ?Labs (2/23): K 4.4, creatinine 1.75 ? ?PMH: ?1. Type 2 diabetes ?2. HTN: Poorly controlled.  ?3. GERD ?4. CKD stage 3 ?5. Hyperlipidemia ?6. Former smoker ?7. CAD: LHC (1/23) with occluded large ramu, occluded moderate D2, 90% dLAD, 80% dLCx, diffuse RCA disease reaching 80% in the mid vessel and 80% in the distal vessel.  No good targets for CABG, and reviewed by interventional cardiology who thought no good interventional targets given absence of anginal pain.  ?8. 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  ?- Echo (11/22): EF 20-25%, severe LV dysfunction with global HK, moderate LVH, grade III DD (restrictive), RV severely reduced, moderate MR  ?- RHC (1/23): mean RA 8, PA 54/17 mean 32, mean PCWP 18, CI 2.54, PVR 2.8 WU (pulmonary venous hypertension).  ?- Echo (4/23): EF 25-30% with moderate LVH and normal RV, mild MR. ?9. OSA: Mild on 2/23 sleep study.  ? ? ?Current Outpatient Medications  ?Medication Sig Dispense Refill  ? aspirin (ASPIRIN CHILDRENS) 81 MG chewable tablet Chew 1 tablet (81 mg total) by mouth daily. 36 tablet 11  ? atorvastatin (LIPITOR) 20 MG tablet Take 4 tablets (80 mg total) by mouth daily. 120 tablet 6  ? carvedilol (COREG)  6.25 MG tablet Take 1 tablet (6.25 mg total) by mouth 2 (two) times daily with a meal. 60 tablet 4  ? empagliflozin (JARDIANCE) 10 MG TABS tablet Take 1 tablet (10 mg total) by mouth daily before breakfast. 30 tablet 4  ? glipiZIDE (GLUCOTROL) 10 MG tablet Take 1 tablet (10 mg total) by mouth daily before breakfast. 90 tablet 1  ? hydrALAZINE (APRESOLINE) 50 MG tablet Take 1.5 tablets (75 mg total) by mouth every 8 (eight) hours. 120 tablet 4  ? isosorbide mononitrate (IMDUR) 30 MG 24 hr tablet Take 1 tablet  (30 mg total) by mouth daily. 30 tablet 4  ? metFORMIN (GLUCOPHAGE-XR) 500 MG 24 hr tablet Take 1 tablet (500 mg total) by mouth 2 (two) times daily. 180 tablet 1  ? torsemide (DEMADEX) 20 MG tablet Take 1 tablet (20 mg total) by mouth daily. 30 tablet 11  ? ?No current facility-administered medications for this encounter.  ? ?Allergies  ?Allergen Reactions  ? Influenza Vaccines   ?  Sick- was put in hospital   ? ?Social History  ? ?Socioeconomic History  ? Marital status: Divorced  ?  Spouse name: Not on file  ? Number of children: 4  ? Years of education: Not on file  ? Highest education level: 11th grade  ?Occupational History  ? Occupation: lumber yard  ?  Comment: operates forklift, lifts lumber "yard dog"  ?Tobacco Use  ? Smoking status: Former  ?  Packs/day: 0.10  ?  Years: 35.00  ?  Pack years: 3.50  ?  Types: Cigarettes  ?  Quit date: 06/17/2021  ?  Years since quitting: 0.3  ? Smokeless tobacco: Never  ? Tobacco comments:  ?  Last use 06/17/21. Pt smoked a pack a week  ?Vaping Use  ? Vaping Use: Never used  ?Substance and Sexual Activity  ? Alcohol use: Not Currently  ? Drug use: Never  ? Sexual activity: Not on file  ?Other Topics Concern  ? Not on file  ?Social History Narrative  ? Not on file  ? ?Social Determinants of Health  ? ?Financial Resource Strain: High Risk  ? Difficulty of Paying Living Expenses: Hard  ?Food Insecurity: Food Insecurity Present  ? Worried About Charity fundraiser in the Last Year: Never true  ? Ran Out of Food in the Last Year: Sometimes true  ?Transportation Needs: No Transportation Needs  ? Lack of Transportation (Medical): No  ? Lack of Transportation (Non-Medical): No  ?Physical Activity: Not on file  ?Stress: Not on file  ?Social Connections: Not on file  ?Intimate Partner Violence: Not on file  ? ?Family History  ?Problem Relation Age of Onset  ? Healthy Mother   ? ?BP (!) 210/110   Pulse 72   Wt 85.4 kg (188 lb 3.2 oz)   SpO2 99%   BMI 27.00 kg/m?  ? ?Wt Readings  from Last 3 Encounters:  ?11/03/21 85.4 kg (188 lb 3.2 oz)  ?08/26/21 88.4 kg (194 lb 12.8 oz)  ?08/11/21 82.1 kg (181 lb)  ? ?PHYSICAL EXAM: ?General: NAD ?Neck: No JVD, no thyromegaly or thyroid nodule.  ?Lungs: Clear to auscultation bilaterally with normal respiratory effort. ?CV: Nondisplaced PMI.  Heart regular S1/S2, no S3/S4, no murmur.  No peripheral edema.  No carotid bruit.  Normal pedal pulses.  ?Abdomen: Soft, nontender, no hepatosplenomegaly, no distention.  ?Skin: Intact without lesions or rashes.  ?Neurologic: Alert and oriented x 3.  ?Psych: Normal affect. ?Extremities: No clubbing  or cyanosis.  ?HEENT: Normal.  ? ?ASSESSMENT & PLAN: ?1. HTN: Long history of poor control.  I suspect that this contributes to his cardiomyopathy.  Markedly elevated today with severe frontal headache.  This has been constant for 2-3 weeks. No neurological symptoms, nausea, vomiting.  Given persistence of headache without neurological symptoms, suspect not associated with head bleed of any sort, but will need to rule this out. Patient insists that headache is due to increasing hydralazine.  ?- Will give dose of clonidine 0.2 mg x 1 in the office.  ?- Decrease hydralazine to 50 mg tid, patient says that he tolerated this dose much better.  ?- Start Entresto 24/26 bid today.  BMET/BNP today, BMET 1 week.  ?- I will send for stat head CT.  If this is unremarkable, close followup in 1 week and check BP daily.  ?- He will need eventual renal artery doppler evaluation to rule out RAS.  ?2. 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 Echo (11/22) w/ severe biventricular dysfunction, LVEF 20-25%, Mod LVH, GIIIDD (restrictive), RV severely reduced, diffuse HK. Underwent R/LHC (1/23) showing significant CAD but no good targets for CABG. Echo today showed EF persistently low at 25-30%. Weight is down 6 lbs and he is not volume overloaded on exam.  ?- Continue Jardiance 10 mg daily.  ?- Continue  carvedilol 6.25 mg bid.  ?- Start Entresto 24/26 bid (last creatinine improved to 1.75).  BMET/BNP today, BMET 10 days.  ?- Decrease torsemide to 20 mg qod with initiation of Entresto.  ?- Decrease hydralazine to 50

## 2021-11-03 NOTE — Patient Instructions (Signed)
Medication Changes: ? ?Decrease Hydralazine to 50 mg Three times a day ? ?Change Torsemide to 20 mg every other day. ? ?Start Entresto 24/26 Twice daily ? ? ?Lab Work: ? ?Labs done today, your results will be available in MyChart, we will contact you for abnormal readings. ? ? ?Testing/Procedures: ? ? ? ?Referrals: ? ?none ? ?Special Instructions // Education: ?Take tylenol for your headache. If the headache persists please go to the Urgent Care or the ED to have it evaluated. ? ?Check your blood pressure at home daily and write it down  ? ?Follow-Up in: 1 week  ? ?At the [facility], you and your health needs are our priority. We have a designated team specialized in the treatment of Heart Failure. This Care Team includes your primary Heart Failure Specialized Cardiologist (physician), Advanced Practice Providers (APPs- Physician Assistants and Nurse Practitioners), and Pharmacist who all work together to provide you with the care you need, when you need it.  ? ?You may see any of the following providers on your designated Care Team at your next follow up: ? ?Dr Glori Bickers ?Dr Loralie Champagne ?Darrick Grinder, NP ?Lyda Jester, PA ?Jessica Milford,NP ?Marlyce Huge, PA ?Audry Riles, PharmD ? ? ?Please be sure to bring in all your medications bottles to every appointment.  ? ?Need to Contact us: ? ?If you have any questions or concerns before your next appointment please send [place] a message through [place] or call our office at [phone].   ? ?TO LEAVE A MESSAGE FOR THE NURSE SELECT OPTION 2, PLEASE LEAVE A MESSAGE INCLUDING: ?YOUR NAME ?DATE OF BIRTH ?CALL BACK NUMBER ?REASON FOR CALL**this is important as we prioritize the call backs ? ?YOU WILL RECEIVE A CALL BACK THE SAME DAY AS LONG AS YOU CALL BEFORE 4:00 PM ? ? ?

## 2021-11-03 NOTE — Telephone Encounter (Addendum)
Advanced Heart Failure Patient Advocate Encounter ? ?Prior Authorization for Sherryll Burger has been approved.   ? ?PA# [id number] ?Effective dates: 11/03/21 through 11/04/22 ? ?Patients co-pay is $150 (insurance allows 30 day billing) Added co-pay card information into [facility]. Updated co-pay, $10. ? ?Archer Asa, CPhT ? ? ? ? ?

## 2021-11-03 NOTE — Telephone Encounter (Signed)
Patient Advocate Encounter ?  ?Received notification from [employer] that prior authorization for Sherryll Burger is required. ?  ?PA submitted on CoverMyMeds ?Key [id number] ?Status is pending ?  ?Will continue to follow. ? ?

## 2021-11-05 ENCOUNTER — Ambulatory Visit (HOSPITAL_COMMUNITY): Payer: 59

## 2021-11-10 ENCOUNTER — Other Ambulatory Visit (HOSPITAL_BASED_OUTPATIENT_CLINIC_OR_DEPARTMENT_OTHER): Payer: Self-pay

## 2021-11-10 ENCOUNTER — Ambulatory Visit (HOSPITAL_COMMUNITY)
Admission: RE | Admit: 2021-11-10 | Discharge: 2021-11-10 | Disposition: A | Discharge status: Home / Self Care | Payer: 59 | Source: Ambulatory Visit | Attending: Cardiology | Admitting: Cardiology

## 2021-11-10 VITALS — BP 120/80 | Wt 186.0 lb

## 2021-11-10 DIAGNOSIS — N183 Chronic kidney disease, stage 3 unspecified: Secondary | ICD-10-CM | POA: Insufficient documentation

## 2021-11-10 DIAGNOSIS — I2510 Atherosclerotic heart disease of native coronary artery without angina pectoris: Secondary | ICD-10-CM | POA: Diagnosis not present

## 2021-11-10 DIAGNOSIS — N1832 Chronic kidney disease, stage 3b: Secondary | ICD-10-CM | POA: Diagnosis not present

## 2021-11-10 DIAGNOSIS — Z7982 Long term (current) use of aspirin: Secondary | ICD-10-CM | POA: Insufficient documentation

## 2021-11-10 DIAGNOSIS — E1122 Type 2 diabetes mellitus with diabetic chronic kidney disease: Secondary | ICD-10-CM | POA: Insufficient documentation

## 2021-11-10 DIAGNOSIS — Z87891 Personal history of nicotine dependence: Secondary | ICD-10-CM | POA: Diagnosis not present

## 2021-11-10 DIAGNOSIS — I130 Hypertensive heart and chronic kidney disease with heart failure and stage 1 through stage 4 chronic kidney disease, or unspecified chronic kidney disease: Secondary | ICD-10-CM | POA: Insufficient documentation

## 2021-11-10 DIAGNOSIS — Z596 Low income: Secondary | ICD-10-CM | POA: Diagnosis not present

## 2021-11-10 DIAGNOSIS — I50810 Right heart failure, unspecified: Secondary | ICD-10-CM | POA: Diagnosis not present

## 2021-11-10 DIAGNOSIS — Z79899 Other long term (current) drug therapy: Secondary | ICD-10-CM | POA: Insufficient documentation

## 2021-11-10 DIAGNOSIS — I5022 Chronic systolic (congestive) heart failure: Secondary | ICD-10-CM | POA: Insufficient documentation

## 2021-11-10 DIAGNOSIS — I272 Pulmonary hypertension, unspecified: Secondary | ICD-10-CM | POA: Insufficient documentation

## 2021-11-10 DIAGNOSIS — I2582 Chronic total occlusion of coronary artery: Secondary | ICD-10-CM | POA: Diagnosis not present

## 2021-11-10 DIAGNOSIS — E785 Hyperlipidemia, unspecified: Secondary | ICD-10-CM

## 2021-11-10 DIAGNOSIS — Z7984 Long term (current) use of oral hypoglycemic drugs: Secondary | ICD-10-CM | POA: Diagnosis not present

## 2021-11-10 DIAGNOSIS — Z5941 Food insecurity: Secondary | ICD-10-CM | POA: Diagnosis not present

## 2021-11-10 DIAGNOSIS — I10 Essential (primary) hypertension: Secondary | ICD-10-CM | POA: Diagnosis not present

## 2021-11-10 DIAGNOSIS — I5042 Chronic combined systolic (congestive) and diastolic (congestive) heart failure: Secondary | ICD-10-CM

## 2021-11-10 LAB — BASIC METABOLIC PANEL
Anion gap: 11 (ref 5–15)
BUN: 34 mg/dL — ABNORMAL HIGH (ref 8–23)
CO2: 22 mmol/L (ref 22–32)
Calcium: 9.2 mg/dL (ref 8.9–10.3)
Chloride: 101 mmol/L (ref 98–111)
Creatinine, Ser: 2.37 mg/dL — ABNORMAL HIGH (ref 0.61–1.24)
GFR, Estimated: 30 mL/min — ABNORMAL LOW (ref >60)
Glucose, Bld: 544 mg/dL (ref 70–99)
Potassium: 4.6 mmol/L (ref 3.5–5.1)
Sodium: 134 mmol/L — ABNORMAL LOW (ref 135–145)

## 2021-11-10 MED ORDER — CARVEDILOL 12.5 MG PO TABS
12.5000 mg | ORAL_TABLET | Freq: Two times a day (BID) | ORAL | 3 refills | Status: DC
Start: 2021-11-10 — End: 2022-02-12
  Filled 2021-11-10 – 2021-12-16 (×2): qty 60, 30d supply, fill #0
  Filled 2022-01-16: qty 60, 30d supply, fill #1

## 2021-11-10 NOTE — Patient Instructions (Signed)
Medication Changes: ? ?Increase Carvedilol to 12.5mg  Twice daily ? ? ?Lab Work: ? ?Labs done today, your results will be available in MyChart, we will contact you for abnormal readings. ? ? ?Testing/Procedures: ? ?Your provider has order a renal ultrasound for you. Once approved by your insurance you will be called to schedule the appointment  ? ?Referrals: ? ?You have been referred to [facility]. They will contact you to arrange the appointment. ? ?You have been referred to the lipid clinic. They will contact you to arrange your appointment  ? ?Please follow up with our heart failure pharmacist in 3 weeks  ? ? ? ? ?Special Instructions // Education: ? ?none ? ?Follow-Up in: 6 weeks  ? ?At the [facility], you and your health needs are our priority. We have a designated team specialized in the treatment of Heart Failure. This Care Team includes your primary Heart Failure Specialized Cardiologist (physician), Advanced Practice Providers (APPs- Physician Assistants and Nurse Practitioners), and Pharmacist who all work together to provide you with the care you need, when you need it.  ? ?You may see any of the following providers on your designated Care Team at your next follow up: ? ?Dr Arvilla Meres ?Dr Marca Ancona ?Tonye Becket, NP ?Robbie Lis, PA ?Jessica Milford,NP ?Anna Genre, PA ?Karle Plumber, PharmD ? ? ?Please be sure to bring in all your medications bottles to every appointment.  ? ?Need to Contact us: ? ?If you have any questions or concerns before your next appointment please send [place] a message through [place] or call our office at [phone].   ? ?TO LEAVE A MESSAGE FOR THE NURSE SELECT OPTION 2, PLEASE LEAVE A MESSAGE INCLUDING: ?YOUR NAME ?DATE OF BIRTH ?CALL BACK NUMBER ?REASON FOR CALL**this is important as we prioritize the call backs ? ?YOU WILL RECEIVE A CALL BACK THE SAME DAY AS LONG AS YOU CALL BEFORE 4:00 PM ? ? ?

## 2021-11-10 NOTE — Progress Notes (Signed)
? ?ADVANCED HF CLINIC NOTE ? ? ?Primary Care: Pcp, No ?Primary Cardiologist: Dr. Irish Lack ?HF Cardiologist: Dr. Aundra Dubin ? ?HPI: ?Rodney Schultz is a 61 y.o. AAM w/ h/o HTN, T2DM and HLD, admitted to [facility] w/ new systolic heart failure in 11/22.  ?  ?He reports long history of poorly controlled HTN in the past. Baseline SBPs would average in the 170s. Denies ETOH use. Former smoker but has quit. No known family h/o CHF. No h/o ischemic-like CP. No known h/o COVID but reports having the flu prior to his 11/22 admission.   ?  ?Admitted 11/22 w/ new CHF, hypertensive + AKI w/ SCr in the 2.0 range (previous baseline ~1.5). Echo w/ severe biventricular dysfunction, LVEF 20-25% with diffuse hypokinesis, mod LVH, GIIIDD (restrictive), RV severely reduced, moderate MR. R/LHC was deferred initially due to renal insuffiency. He was diuresed w/ IV Lasix. GDMT w/ hydralazine, Imdur and Coreg initiated. Discharge wt 177 lb. Referred to TOC.  ?  ?Seen 1/23 in HF clinic to establish care for his CHF. R/LHC showed mildly elevated PCWP, pulmonary venous hypertension, preserved CO and diffuse CAD w/ occluded large ramus and moderate D1. 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 No good PCI options and he does not have good targets for CABG (would be high risk CABG with low EF).  Medically managed in setting of no anginal chest pain.  ? ?Echo in 4/23 showed EF 25-30% with moderate LVH and normal RV, mild MR.  ? ?Patient returns for followup of CHF.  After decreasing hydralazine to 50 mg tid, headache has completely resolved.  Head CT after last appointment showed no evidence for CNS bleeding.  He is doing well symptomatically.  Able to push mow his backyard without dyspnea.  No dyspnea walking on flat ground or up a flight of stairs.  No chest pain.  No orthopnea/PND.  Weight down 2 lbs.   BP now controlled. ? ?Labs (1/23): K 4.3, creatinine 1.95 ?Labs (2/23): K 4.4, creatinine 1.75 ?Labs (4/23): K 4.4, creatinine  2.27, BNP 45, LDL 123, TGs 409 ? ?PMH: ?1. Type 2 diabetes ?2. HTN: Poorly controlled.  ?3. GERD ?4. CKD stage 3 ?5. Hyperlipidemia ?6. Former smoker ?7. CAD: LHC (1/23) with occluded large ramu, occluded moderate D2, 90% dLAD, 80% dLCx, diffuse RCA disease reaching 80% in the mid vessel and 80% in the distal vessel.  No good targets for CABG, and reviewed by interventional cardiology who thought no good interventional targets given absence of anginal pain.  ?8. 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  ?- Echo (11/22): EF 20-25%, severe LV dysfunction with global HK, moderate LVH, grade III DD (restrictive), RV severely reduced, moderate MR  ?- RHC (1/23): mean RA 8, PA 54/17 mean 32, mean PCWP 18, CI 2.54, PVR 2.8 WU (pulmonary venous hypertension).  ?- Echo (4/23): EF 25-30% with moderate LVH and normal RV, mild MR. ?9. OSA: Mild on 2/23 sleep study.  ? ? ?Current Outpatient Medications  ?Medication Sig Dispense Refill  ? aspirin (ASPIRIN CHILDRENS) 81 MG chewable tablet Chew 1 tablet (81 mg total) by mouth daily. 36 tablet 11  ? atorvastatin (LIPITOR) 80 MG tablet Take 1 tablet (80 mg total) by mouth daily. 90 tablet 3  ? empagliflozin (JARDIANCE) 10 MG TABS tablet Take 1 tablet (10 mg total) by mouth daily before breakfast. 30 tablet 4  ? glipiZIDE (GLUCOTROL) 10 MG tablet Take 1 tablet (10 mg total) by mouth daily before breakfast. 90  tablet 1  ? hydrALAZINE (APRESOLINE) 50 MG tablet Take 1 tablet (50 mg total) by mouth every 8 (eight) hours. 120 tablet 4  ? isosorbide mononitrate (IMDUR) 30 MG 24 hr tablet Take 1 tablet (30 mg total) by mouth daily. 30 tablet 4  ? metFORMIN (GLUCOPHAGE-XR) 500 MG 24 hr tablet Take 1 tablet (500 mg total) by mouth 2 (two) times daily. 180 tablet 1  ? sacubitril-valsartan (ENTRESTO) 24-26 MG Take 1 tablet by mouth 2 (two) times daily. 60 tablet 0  ? torsemide (DEMADEX) 20 MG tablet Take 1 tablet (20 mg total) by mouth every other day. 30  tablet 11  ? carvedilol (COREG) 12.5 MG tablet Take 1 tablet (12.5 mg total) by mouth 2 (two) times daily with a meal. 180 tablet 3  ? ?No current facility-administered medications for this encounter.  ? ?Allergies  ?Allergen Reactions  ? Influenza Vaccines   ?  Sick- was put in hospital   ? ?Social History  ? ?Socioeconomic History  ? Marital status: Divorced  ?  Spouse name: Not on file  ? Number of children: 4  ? Years of education: Not on file  ? Highest education level: 11th grade  ?Occupational History  ? Occupation: lumber yard  ?  Comment: operates forklift, lifts lumber "yard dog"  ?Tobacco Use  ? Smoking status: Former  ?  Packs/day: 0.10  ?  Years: 35.00  ?  Pack years: 3.50  ?  Types: Cigarettes  ?  Quit date: 06/17/2021  ?  Years since quitting: 0.4  ? Smokeless tobacco: Never  ? Tobacco comments:  ?  Last use 06/17/21. Pt smoked a pack a week  ?Vaping Use  ? Vaping Use: Never used  ?Substance and Sexual Activity  ? Alcohol use: Not Currently  ? Drug use: Never  ? Sexual activity: Not on file  ?Other Topics Concern  ? Not on file  ?Social History Narrative  ? Not on file  ? ?Social Determinants of Health  ? ?Financial Resource Strain: High Risk  ? Difficulty of Paying Living Expenses: Hard  ?Food Insecurity: Food Insecurity Present  ? Worried About Charity fundraiser in the Last Year: Never true  ? Ran Out of Food in the Last Year: Sometimes true  ?Transportation Needs: No Transportation Needs  ? Lack of Transportation (Medical): No  ? Lack of Transportation (Non-Medical): No  ?Physical Activity: Not on file  ?Stress: Not on file  ?Social Connections: Not on file  ?Intimate Partner Violence: Not on file  ? ?Family History  ?Problem Relation Age of Onset  ? Healthy Mother   ? ?BP 120/80   Wt 84.4 kg (186 lb)   BMI 26.69 kg/m?  ? ?Wt Readings from Last 3 Encounters:  ?11/10/21 84.4 kg (186 lb)  ?11/03/21 85.4 kg (188 lb 3.2 oz)  ?08/26/21 88.4 kg (194 lb 12.8 oz)  ? ?PHYSICAL EXAM: ?General: NAD ?Neck:  No JVD, no thyromegaly or thyroid nodule.  ?Lungs: Clear to auscultation bilaterally with normal respiratory effort. ?CV: Nondisplaced PMI.  Heart regular S1/S2, no S3/S4, no murmur.  No peripheral edema.  No carotid bruit.  Normal pedal pulses.  ?Abdomen: Soft, nontender, no hepatosplenomegaly, no distention.  ?Skin: Intact without lesions or rashes.  ?Neurologic: Alert and oriented x 3.  ?Psych: Normal affect. ?Extremities: No clubbing or cyanosis.  ?HEENT: Normal.  ? ?ASSESSMENT & PLAN: ?1. HTN: Long history of poor control.  Much improved today.  Decreased hydralazine to 50 mg tid,  he insists that hydralazine 75 mg tid caused worsening BP and headache.  ?- Continue hydralazine 50 mg tid.   ?- Increase Coreg to 12.5 mg bid ?- Continue Entresto 24/26 bid today.  BMET today.   ?- I will arrange for renal artery doppler evaluation to rule out RAS.  ?2. 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 Echo (11/22) w/ severe biventricular dysfunction, LVEF 20-25%, Mod LVH, GIIIDD (restrictive), RV severely reduced, diffuse HK. Underwent R/LHC (1/23) showing significant CAD but no good targets for CABG. Echo in 4/23 showed EF persistently low at 25-30%. Weight is down 2 lbs and he is not volume overloaded on exam. NYHA class II.  ?- Continue Jardiance 10 mg daily.  ?- Increase Coreg to 12.5 mg bid.  ?- Continue Entresto 24/26 bid, check BMET today.   ?- Continue torsemide 20 mg qod.  ?- Continue hydralazine 50 mg tid (cannot tolerate higher dose).  ?- Continue Imdur 30 mg daily.    ?- Repeat echo in about 3 months, ICD if EF low.  ?3. CAD: LHC (1/23) with diffuse disease occluded large ramus and moderate D1. 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 No good PCI options in setting of diffuse disease and no chest pain, and he does not have good targets for CABG (would be high risk CABG with low EF).  Medically manage. Still denies chest pain.  ?- Continue ASA +  statin. ?4. CKD, Stage III:  ?- Referred to Nephrology. ?- BMET today, follow renal function closely. ?5. Hyperlipidemia: LDL still high on atorvastatin 80 mg daily.  ?- Continue atorvastatin 80 mg daily.  ?-

## 2021-11-11 ENCOUNTER — Telehealth (HOSPITAL_COMMUNITY): Payer: Self-pay | Admitting: Surgery

## 2021-11-11 MED ORDER — TORSEMIDE 20 MG PO TABS: 20.0000 mg | ORAL_TABLET | ORAL | 11 refills | Status: DC

## 2021-11-11 NOTE — Telephone Encounter (Signed)
Patient contacted to review results and recommendations per Dr. Aundra Dubin .  He plans to make an appt with his PCP and will adjust his Torsemide dose.  Medication list updated in [facility]. ?

## 2021-11-11 NOTE — Telephone Encounter (Signed)
-----   Message from Laurey Morale, MD sent at 11/10/2021 10:05 PM EDT ----- ?Glucose very high.  Needs asap evaluation by PCP.  Would decrease torsemide to every 3rd day with higher creatinine.  ?

## 2021-11-12 ENCOUNTER — Ambulatory Visit (HOSPITAL_COMMUNITY): Admission: RE | Admit: 2021-11-12 | Payer: 59 | Source: Ambulatory Visit | Attending: Cardiology | Admitting: Cardiology

## 2021-11-12 ENCOUNTER — Inpatient Hospital Stay (HOSPITAL_COMMUNITY): Admission: RE | Admit: 2021-11-12 | Payer: 59 | Source: Ambulatory Visit

## 2021-11-14 ENCOUNTER — Telehealth (HOSPITAL_COMMUNITY): Payer: Self-pay | Admitting: *Deleted

## 2021-11-14 NOTE — Telephone Encounter (Signed)
Pt left vm requesting call about disability paperwork status. I do not see any notes will forward message to [facility].  ?

## 2021-11-17 NOTE — Progress Notes (Signed)
?  ?Advanced Heart Failure Clinic Note  ? ?Primary Care: Pcp, No ?Primary Cardiologist: Dr. Irish Lack ?HF Cardiologist: Dr. Aundra Dubin ?  ? ?HPI:  ?Rodney Schultz is a 61 y.o. AAM w/ h/o HTN, T2DM and HLD, admitted to [facility] w/ new systolic heart failure in 05/2021.  ?  ?He has reported long history of poorly controlled HTN in the past. Baseline SBPs would average in the 170s. Denies ETOH use. Former smoker but has quit. No known family h/o CHF. No h/o ischemic-like CP. No known h/o COVID but reports having the flu prior to his 05/2021 admission.   ?  ?Admitted 05/2021 w/ new CHF, hypertensive + AKI w/ SCr in the 2.0 range (previous baseline ~1.5). Echo w/ severe biventricular dysfunction, LVEF 20-25% with diffuse hypokinesis, mod LVH, GIIIDD (restrictive), RV severely reduced, moderate MR. R/LHC was deferred initially due to renal insuffiency. He was diuresed w/ IV Lasix. GDMT w/ hydralazine, Imdur and carvedilol initiated. Discharge weight 177 lbs. Referred to TOC.  ?  ?Seen 07/2021 in HF clinic to establish care for his CHF. R/LHC showed mildly elevated PCWP, pulmonary venous hypertension, preserved CO and diffuse CAD w/ occluded large ramus and moderate D1. 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 No good PCI options and he does not have good targets for CABG (would be high risk CABG with low EF).  Medically managed in setting of no anginal chest pain.  ?  ?Echo in 10/2021 showed EF 25-30% with moderate LVH and normal RV, mild MR.  ?  ?Patient returned to AHF Clinic for follow-up 11/10/21.  Noted that after decreasing hydralazine to 50 mg TID, his headaches had completely resolved.  Head CT after previous appointment showed no evidence for CNS bleeding.  He was doing well symptomatically.  Reported being able to push mow his backyard without dyspnea.  No dyspnea walking on flat ground or up a flight of stairs.  No chest pain.  No orthopnea/PND.  Weight was down 2 lbs.   BP was controlled. ? ?Today  he returns to HF clinic for pharmacist medication titration. At last visit with MD carvedilol was increased to 12.5 mg BID. Overall he is feeling good today. Says he feels better since his carvedilol was increased. No dizziness or lightheadedness. No CP or palpitations. Gets SOB walking from his house to the stop sign. Notes he gets SOB going up hills. Weight at home has been stable at 180 lbs. Takes torsemide 20 mg every 3rd day. He has not needed any extra doses. No LEE, PND or orthopnea. Reviewed dispense history and most medications (except Entresto and atorvastatin) have not been filled since February. However, he endorses taking his medications as prescribed every day. Says he had extra pills left over after not taking his medications while hospitalized. BP in clinic high at 175/78 - he did not take his medications yet this morning. Says his SBPs at home are in the 130s after taking his medications.  ? ? ? ?HF Medications: ?Carvedilol 12.5 mg BID ?Entresto 24/26 mg BID ?Jardiance 10 mg daily ?Hydralazine 50 mg TID ?Imdur 30 mg daily ?Torsemide 20 mg every 3 days ? ?Has the patient been experiencing any side effects to the medications prescribed?  no ? ?Does the patient have any problems obtaining medications due to transportation or finances?   No - [employer] ? ?Understanding of regimen: fair ?Understanding of indications: fair ?Potential of compliance: fair ?Patient understands to avoid NSAIDs. ?Patient understands to avoid decongestants. ?  ? ?  Pertinent Lab Values: ?Serum creatinine 2.37, BUN 34, Potassium 4.6, Sodium 134 ? ?Vital Signs: ?Weight: 182.8 lbs (last clinic weight: 186 lbs) ?Blood pressure: 175/78- did not take morning medications  ?Heart rate: 65  ? ?Assessment/Plan: ?1. HTN: Long history of poor control.  BP elevated in clinic, did not take his medications this morning yet. ?- Continue hydralazine 50 mg TID. Patient had worsened BP and headaches when on higher doses (75 mg TID)  in the past.  ?- Continue carvedilol 12.5 mg BID ?- Increase Entresto to 49/51 mg BID ? ?2. 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 Echo (05/2021) w/ severe biventricular dysfunction, LVEF 20-25%, Mod LVH, GIIIDD (restrictive), RV severely reduced, diffuse HK. Underwent R/LHC (07/2021) showing significant CAD but no good targets for CABG. Echo in 10/2021 showed EF persistently low at 25-30%.  ?- NYHA class II. Euvolemic on exam.  ?- Decrease torsemide to 20 mg PRN only ?- Continue carvedilol 12.5 mg BID.  ?- Increase Entresto to 49/51 mg BID. Recheck BMET in 2 weeks ?- Continue Jardiance 10 mg daily.   ?- Continue hydralazine 50 mg TID (cannot tolerate higher dose).  ?- Continue Imdur 30 mg daily.    ?- Repeat echo in about 3 months, ICD if EF low.  ?3. CAD: LHC (07/2021) with diffuse disease occluded large ramus and moderate D1. 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 No good PCI options in setting of diffuse disease and no chest pain, and he does not have good targets for CABG (would be high risk CABG with low EF).  Medically manage. Still denies chest pain.  ?- Continue ASA + statin. ?4. CKD, Stage III:  ?- Referred to Nephrology. ?5. Hyperlipidemia: LDL still high on atorvastatin 80 mg daily.  ?- Continue atorvastatin 80 mg daily.  ?- Referred to lipid clinic for [place].  ? ?Follow up 2 weeks with [facility].  ? ? ?Audry Riles, PharmD, BCPS, BCCP, CPP ?Heart Failure Clinic Pharmacist ?[phone] ?  ?

## 2021-11-19 ENCOUNTER — Other Ambulatory Visit (HOSPITAL_BASED_OUTPATIENT_CLINIC_OR_DEPARTMENT_OTHER): Payer: Self-pay

## 2021-11-20 ENCOUNTER — Other Ambulatory Visit (HOSPITAL_BASED_OUTPATIENT_CLINIC_OR_DEPARTMENT_OTHER): Payer: Self-pay

## 2021-11-20 ENCOUNTER — Telehealth (HOSPITAL_COMMUNITY): Payer: Self-pay | Admitting: *Deleted

## 2021-11-20 NOTE — Telephone Encounter (Signed)
Disability paperwork faxed to principal at [phone]  ?

## 2021-12-01 ENCOUNTER — Other Ambulatory Visit (HOSPITAL_BASED_OUTPATIENT_CLINIC_OR_DEPARTMENT_OTHER): Payer: Self-pay

## 2021-12-01 ENCOUNTER — Ambulatory Visit (HOSPITAL_COMMUNITY)
Admission: RE | Admit: 2021-12-01 | Discharge: 2021-12-01 | Disposition: A | Discharge status: Home / Self Care | Payer: 59 | Source: Ambulatory Visit | Attending: Internal Medicine | Admitting: Internal Medicine

## 2021-12-01 DIAGNOSIS — I10 Essential (primary) hypertension: Secondary | ICD-10-CM

## 2021-12-02 ENCOUNTER — Ambulatory Visit (HOSPITAL_COMMUNITY)
Admission: RE | Admit: 2021-12-02 | Discharge: 2021-12-02 | Disposition: A | Discharge status: Home / Self Care | Payer: 59 | Source: Ambulatory Visit | Attending: Cardiology | Admitting: Cardiology

## 2021-12-02 ENCOUNTER — Other Ambulatory Visit (HOSPITAL_BASED_OUTPATIENT_CLINIC_OR_DEPARTMENT_OTHER): Payer: Self-pay

## 2021-12-02 ENCOUNTER — Other Ambulatory Visit (HOSPITAL_COMMUNITY): Payer: Self-pay

## 2021-12-02 VITALS — BP 175/78 | HR 65 | Wt 182.8 lb

## 2021-12-02 DIAGNOSIS — E785 Hyperlipidemia, unspecified: Secondary | ICD-10-CM | POA: Diagnosis not present

## 2021-12-02 DIAGNOSIS — Z7960 Long term (current) use of unspecified immunomodulators and immunosuppressants: Secondary | ICD-10-CM | POA: Insufficient documentation

## 2021-12-02 DIAGNOSIS — Z793 Long term (current) use of hormonal contraceptives: Secondary | ICD-10-CM | POA: Diagnosis not present

## 2021-12-02 DIAGNOSIS — Z79899 Other long term (current) drug therapy: Secondary | ICD-10-CM | POA: Insufficient documentation

## 2021-12-02 DIAGNOSIS — Z87891 Personal history of nicotine dependence: Secondary | ICD-10-CM | POA: Insufficient documentation

## 2021-12-02 DIAGNOSIS — N183 Chronic kidney disease, stage 3 unspecified: Secondary | ICD-10-CM | POA: Diagnosis not present

## 2021-12-02 DIAGNOSIS — I130 Hypertensive heart and chronic kidney disease with heart failure and stage 1 through stage 4 chronic kidney disease, or unspecified chronic kidney disease: Secondary | ICD-10-CM | POA: Diagnosis not present

## 2021-12-02 DIAGNOSIS — I5022 Chronic systolic (congestive) heart failure: Secondary | ICD-10-CM | POA: Diagnosis not present

## 2021-12-02 DIAGNOSIS — E1122 Type 2 diabetes mellitus with diabetic chronic kidney disease: Secondary | ICD-10-CM | POA: Insufficient documentation

## 2021-12-02 DIAGNOSIS — Z7902 Long term (current) use of antithrombotics/antiplatelets: Secondary | ICD-10-CM | POA: Diagnosis not present

## 2021-12-02 MED ORDER — ISOSORBIDE MONONITRATE ER 30 MG PO TB24
30.0000 mg | ORAL_TABLET | Freq: Every day | ORAL | 6 refills | Status: DC
  Filled 2021-12-02: qty 30, 30d supply, fill #0

## 2021-12-02 MED ORDER — ASPIRIN 81 MG PO CHEW
81.0000 mg | CHEWABLE_TABLET | Freq: Every day | ORAL | 11 refills | Status: DC
  Filled 2021-12-02: qty 90, 90d supply, fill #0
  Filled 2022-04-02 – 2022-04-30 (×2): qty 36, 34d supply, fill #1

## 2021-12-02 MED ORDER — HYDRALAZINE HCL 50 MG PO TABS
50.0000 mg | ORAL_TABLET | Freq: Three times a day (TID) | ORAL | 6 refills | Status: DC
  Filled 2021-12-02: qty 90, 30d supply, fill #0

## 2021-12-02 MED ORDER — EMPAGLIFLOZIN 10 MG PO TABS
10.0000 mg | ORAL_TABLET | Freq: Every day | ORAL | 6 refills | Status: DC
  Filled 2021-12-02: qty 30, 30d supply, fill #0

## 2021-12-02 MED ORDER — TORSEMIDE 20 MG PO TABS
20.0000 mg | ORAL_TABLET | Freq: Every day | ORAL | 6 refills | Status: DC | PRN
  Filled 2021-12-02: qty 30, 30d supply, fill #0
  Filled 2022-11-17: qty 30, 30d supply, fill #1

## 2021-12-02 MED ORDER — SACUBITRIL-VALSARTAN 49-51 MG PO TABS
1.0000 | ORAL_TABLET | Freq: Two times a day (BID) | ORAL | 6 refills | Status: DC
  Filled 2021-12-02: qty 60, 30d supply, fill #0

## 2021-12-02 NOTE — Patient Instructions (Signed)
It was a pleasure seeing you today! ? ?MEDICATIONS: ?-We are changing your medications today ?-Increase Entresto to 49/51 mg (1 tablet) twice daily ?-Decrease torsemide to take 20 mg (1 tablet) as needed for fluid retention only. You may take 1 tablet if you have leg swelling of if you gain 3 lbs overnight or 5 lbs in 1 week.  ?-Call if you have questions about your medications. ? ? ?NEXT APPOINTMENT: ?Return to clinic in 3 weeks with [facility]. ? ?In general, to take care of your heart failure: ?-Limit your fluid intake to 2 Liters (half-gallon) per day.   ?-Limit your salt intake to ideally 2-3 grams (2000-3000 mg) per day. ?-Weigh yourself daily and record, and bring that "weight diary" to your next appointment.  (Weight gain of 2-3 pounds in 1 day typically means fluid weight.) ?-The medications for your heart are to help your heart and help you live longer.   ?-Please contact us before stopping any of your heart medications. ? ?Call the clinic at [phone] with questions or to reschedule future appointments.  ?

## 2021-12-04 ENCOUNTER — Other Ambulatory Visit (HOSPITAL_BASED_OUTPATIENT_CLINIC_OR_DEPARTMENT_OTHER): Payer: Self-pay

## 2021-12-09 ENCOUNTER — Telehealth (HOSPITAL_COMMUNITY): Payer: Self-pay | Admitting: Cardiology

## 2021-12-09 DIAGNOSIS — N2889 Other specified disorders of kidney and ureter: Secondary | ICD-10-CM

## 2021-12-09 NOTE — Telephone Encounter (Signed)
-----   Message from Laurey Morale, MD sent at 12/01/2021  4:30 PM EDT ----- 1. Mild renal artery stenosis.  2. Mass left kidney, needs noncontrast CT abdomen to evaluate.

## 2021-12-09 NOTE — Telephone Encounter (Signed)
Patient called.  Patient aware.' ORDER PLACED AND MESSAGE TO [place]

## 2021-12-11 ENCOUNTER — Encounter: Payer: Self-pay | Admitting: Pharmacist

## 2021-12-11 ENCOUNTER — Ambulatory Visit: Payer: 59 | Admitting: Pharmacist

## 2021-12-11 VITALS — BP 170/94 | HR 68 | Resp 17 | Ht 70.0 in | Wt 180.0 lb

## 2021-12-11 DIAGNOSIS — E785 Hyperlipidemia, unspecified: Secondary | ICD-10-CM | POA: Diagnosis not present

## 2021-12-11 DIAGNOSIS — E1169 Type 2 diabetes mellitus with other specified complication: Secondary | ICD-10-CM

## 2021-12-11 NOTE — Patient Instructions (Addendum)
It was nice meeting you two today  We would like your LDL (bad cholesterol) less than 55 We would like your A1c less than 7.0%  We will check your A1c today to see how your blood sugar is  I recommend Famotidine 10mg  for your stomach. You can take up to one tablet twice a day  Continue your atorvastatin 80mg  daily  I will call you tomorrow with your results   Karren Cobble, PharmD, [place], [place], [place], [place], [place], [address] Phone: [phone], Fax: [phone]

## 2021-12-11 NOTE — Progress Notes (Signed)
Patient ID: AARYN SERMON                 DOB: [birth year]                    MRN: [id number]     HPI: Rodney Schultz is a 61 y.o. male patient referred to lipid clinic by Dr Shirlee Latch. PMH is significant for CHF, CKD, TDM, and HLD.    Patient presents today with partner Daniel Nones. Reports he feels poorly, especially in his abdomen. Is often tired, is constantly thirsty, and urinating frequently. Recently torsemide was reduced to PRN.  Last A1c was 6.9 on 06/19/21.  However random blood glucose on 11/03/21 was 544. Feels like he has lost weight.  Currently managed on atorvastatin 80mg  for HLD which he tolerates well.  Vascepa was recommended for triglycerides although this has not been started.  Believes he feels poorly due to new medications that have been started. Reports Zantac has relieved some of his abdominal symptoms.  Current Medications:  Atorvastatin 80mg  daily  Intolerances: N/A  Risk Factors:  T2DM CHF  LDL goal: <55  Labs: TC 258, Trigs 409, HDL 31. Direct LDL 121 (11/03/21 on atorvastatin 80)  Past Medical History:  Diagnosis Date   Diabetes mellitus without complication (HCC)    GERD (gastroesophageal reflux disease)    Hypertension     Current Outpatient Medications on File Prior to Visit  Medication Sig Dispense Refill   aspirin (ASPIRIN CHILDRENS) 81 MG chewable tablet Chew 1 tablet (81 mg total) by mouth daily. 30 tablet 11   atorvastatin (LIPITOR) 80 MG tablet Take 1 tablet (80 mg total) by mouth daily. 90 tablet 3   carvedilol (COREG) 12.5 MG tablet Take 1 tablet (12.5 mg total) by mouth 2 (two) times daily with a meal. 180 tablet 3   empagliflozin (JARDIANCE) 10 MG TABS tablet Take 1 tablet (10 mg total) by mouth daily before breakfast. 30 tablet 6   glipiZIDE (GLUCOTROL) 10 MG tablet Take 1 tablet (10 mg total) by mouth daily before breakfast. 90 tablet 1   hydrALAZINE (APRESOLINE) 50 MG tablet Take 1 tablet (50 mg total) by mouth 3 (three) times daily. 90  tablet 6   isosorbide mononitrate (IMDUR) 30 MG 24 hr tablet Take 1 tablet (30 mg total) by mouth daily. 30 tablet 6   metFORMIN (GLUCOPHAGE-XR) 500 MG 24 hr tablet Take 1 tablet (500 mg total) by mouth 2 (two) times daily. 180 tablet 1   sacubitril-valsartan (ENTRESTO) 49-51 MG Take 1 tablet by mouth 2 (two) times daily. 60 tablet 6   torsemide (DEMADEX) 20 MG tablet Take 1 tablet (20 mg total) by mouth daily as needed for extra fluid. 30 tablet 6   [DISCONTINUED] loratadine (CLARITIN) 10 MG tablet Take 10 mg by mouth daily as needed for allergies.     No current facility-administered medications on file prior to visit.    Allergies  Allergen Reactions   Influenza Vaccines     Sick- was put in hospital     Assessment/Plan:  1. Hyperlipidemia - Patient's LDL 121 which is above goal of <55.  Currently on high intensity statin. Due to risk factors, recommended [id number].  Using , educated patient on storage, site selection, and administration. Patient voiced understanding. Also printed patient assistance application  2. DM - Patient describing symptoms consistent with uncontrolled blood glucose. Last A1c at goal 6 months ago. However he is reporting polyuria, polydipsia, weght loss and  fatigue. Reports blood glucose readings at home are not elevated but admits to not checking frequently. Glucose was 544 at last BMP. Will update A1c today.  Patient does not have a PCP to manage DM. Will follow up with patient in clinic next week.  Laural Golden, PharmD, BCACP, CDCES, CPP [address], [address] [place], [place], [address] Phone: [phone], Fax: [phone]

## 2021-12-16 ENCOUNTER — Other Ambulatory Visit (HOSPITAL_BASED_OUTPATIENT_CLINIC_OR_DEPARTMENT_OTHER): Payer: Self-pay

## 2021-12-16 LAB — HEMOGLOBIN A1C
Est. average glucose Bld gHb Est-mCnc: 384 mg/dL
Hgb A1c MFr Bld: 15 % — ABNORMAL HIGH (ref 4.8–5.6)

## 2021-12-17 ENCOUNTER — Encounter: Payer: Self-pay | Admitting: Pharmacist

## 2021-12-17 ENCOUNTER — Other Ambulatory Visit (HOSPITAL_BASED_OUTPATIENT_CLINIC_OR_DEPARTMENT_OTHER): Payer: Self-pay

## 2021-12-17 ENCOUNTER — Ambulatory Visit (INDEPENDENT_AMBULATORY_CARE_PROVIDER_SITE_OTHER): Payer: 59 | Admitting: Pharmacist

## 2021-12-17 VITALS — BP 136/94 | HR 66 | Wt 178.6 lb

## 2021-12-17 DIAGNOSIS — E1165 Type 2 diabetes mellitus with hyperglycemia: Secondary | ICD-10-CM

## 2021-12-17 MED ORDER — LANCETS MICRO THIN 33G MISC
1 refills | Status: DC
  Filled 2021-12-17: qty 100, 30d supply, fill #0
  Filled 2022-04-02 – 2022-04-30 (×2): qty 100, 30d supply, fill #1

## 2021-12-17 MED ORDER — ONETOUCH ULTRA 2 W/DEVICE KIT
PACK | 1 refills | Status: DC
  Filled 2021-12-17: qty 1, 30d supply, fill #0

## 2021-12-17 MED ORDER — GLUCOSE BLOOD VI STRP
ORAL_STRIP | 12 refills | Status: DC
  Filled 2021-12-17: qty 50, 25d supply, fill #0
  Filled 2022-04-02 – 2022-04-30 (×2): qty 50, 25d supply, fill #1

## 2021-12-17 MED ORDER — UNIFINE PENTIPS 32G X 4 MM MISC
2 refills | Status: DC
  Filled 2021-12-17: qty 100, 30d supply, fill #0
  Filled 2022-04-01: qty 100, 30d supply, fill #1

## 2021-12-17 MED ORDER — TOUJEO SOLOSTAR 300 UNIT/ML ~~LOC~~ SOPN
15.0000 [IU] | PEN_INJECTOR | Freq: Every day | SUBCUTANEOUS | 3 refills | Status: DC
  Filled 2021-12-17: qty 1.5, 30d supply, fill #0
  Filled 2021-12-30 – 2022-01-09 (×2): qty 1.5, 30d supply, fill #1
  Filled 2022-02-11: qty 4.5, 30d supply, fill #2
  Filled 2022-04-02 – 2022-04-30 (×2): qty 4.5, 30d supply, fill #3

## 2021-12-17 NOTE — Patient Instructions (Addendum)
It was good seeing you again  Your blood sugar is very high  I have sent over insulin for you to start. You can start at 15 units per day.    I have also sent a meter and test strips for you to start keeping track of your blood sugar  Please establish with the primary care office at [facility]  The phone number for the primary care office is [phone].  Please call me with any questions or concerns  Laural Golden, PharmD, BCACP, CDCES, CPP [address], [address] [place], [place], [address] Phone: [phone], Fax: [phone]

## 2021-12-17 NOTE — Progress Notes (Signed)
   S:   DM  Patient referred to me by Dr Aundra Dubin for lipid referral. PMH significant for HF T2DM, HTN, HLD and tobacco use.  At appointment last week patient complained of symptoms consistent wih uncontrolled DM. Advised patient should have A1c checked to confirm DM still controlled Lab work showed A1c 15.0%.    Patient presents today still feeling poorly.  Has not been testing blood sugar at home. Reports he has been on insulin in that past when he was in prison. That was the last time he reports his blood sugar was this elevated.  Reports he is not drinking sugary beverages and is mostly eating salads but does not have much appetite.    In room BG:  479  Patient does not currently have a primary care provider.  Preferred [facility]  Medication Adherence  -Current diabetes medications include:  Metformin 500mg  BID Glipizide 10mg  daily Jardiance 10mg  daily   HbA1c Lab Results  Component Value Date   HGBA1C 15.0 (H) 12/11/2021   HGBA1C 6.9 (H) 06/19/2021   HGBA1C 7.0 (A) 03/27/2021    Lipids    Component Value Date/Time   CHOL 258 (H) 11/03/2021 1248   TRIG 409 (H) 11/03/2021 1248   HDL 31 (L) 11/03/2021 1248   CHOLHDL 8.3 11/03/2021 1248   VLDL UNABLE TO CALCULATE IF TRIGLYCERIDE OVER 400 mg/dL 11/03/2021 1248   LDLCALC UNABLE TO CALCULATE IF TRIGLYCERIDE OVER 400 mg/dL 11/03/2021 1248   LDLDIRECT 122.9 (H) 11/03/2021 1248    Assessment:  Patient A1c >15.0% but reports is not eating/drinking foods that would cause extreme glucose values. Feels very poorly. Strongly encouraged him to establish with PCP at [facility] where he receives his prescriptions.  Will start basal insulin at 15 units once daily to help patient feel better. Likely will need further titration from PCP. Will also send glucometer and supplies for home monitoring.  Patient thankful for assistance.  Has CHF appt with Dr Aundra Dubin next week.  Will follow up with patient in 1-2  weeks.  Plan: Continue metformin 500mg  BID Continue Jardiance 10mg  daily Continue glipizide 10mg  daily Start Toujeo 15 units once daily Recheck in 1-2 weeks  Karren Cobble, PharmD, [place], [place], [place], [place], [place], [address] Phone: [phone], Fax: [phone]

## 2021-12-22 NOTE — Progress Notes (Signed)
ADVANCED HF CLINIC NOTE   Primary Care: Pcp, No Primary Cardiologist: Dr. Irish Lack HF Cardiologist: Dr. Aundra Dubin  HPI: Rodney Schultz is a 61 y.o. AAM w/ h/o HTN, T2DM and HLD, admitted to [facility] w/ new systolic heart failure in 11/22.    He reports long history of poorly controlled HTN in the past. Baseline SBPs would average in the 170s. Denies ETOH use. Former smoker but has quit. No known family h/o CHF. No h/o ischemic-like CP. No known h/o COVID but reports having the flu prior to his 11/22 admission.     Admitted 11/22 w/ new CHF, hypertensive + AKI w/ SCr in the 2.0 range (previous baseline ~1.5). Echo w/ severe biventricular dysfunction, LVEF 20-25% with diffuse hypokinesis, mod LVH, GIIIDD (restrictive), RV severely reduced, moderate MR. R/LHC was deferred initially due to renal insuffiency. He was diuresed w/ IV Lasix. GDMT w/ hydralazine, Imdur and Coreg initiated. Discharge wt 177 lb. Referred to TOC.    Seen 1/23 in HF clinic to establish care for his CHF. R/LHC showed mildly elevated PCWP, pulmonary venous hypertension, preserved CO and diffuse CAD w/ occluded large ramus and moderate D1. 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 No good PCI options and he does not have good targets for CABG (would be high risk CABG with low EF).  Medically managed in setting of no anginal chest pain.   Echo in 4/23 showed EF 25-30% with moderate LVH and normal RV, mild MR.   Today he returns for HF follow up. Overall feeling fine. Walks outside for exercise, no SOB with inclines. He is able to push mow his back yard. Denies palpitations, CP, dizziness, edema, or PND/Orthopnea. Appetite ok. No fever or chills. Weight at home 182 pounds. Taking all medications. BP at home today 136/94. Awaiting insurance approval for [place].   Labs (1/23): K 4.3, creatinine 1.95 Labs (2/23): K 4.4, creatinine 1.75 Labs (4/23): K 4.4, creatinine 2.27, BNP 45, LDL 123, TGs 409  PMH: 1. Type  2 diabetes 2. HTN: Poorly controlled.  3. GERD 4. CKD stage 3 5. Hyperlipidemia 6. Former smoker 7. CAD: LHC (1/23) with occluded large ramu, occluded moderate D2, 90% dLAD, 80% dLCx, diffuse RCA disease reaching 80% in the mid vessel and 80% in the distal vessel.  No good targets for CABG, and reviewed by interventional cardiology who thought no good interventional targets given absence of anginal pain.  8. 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  - Echo (11/22): EF 20-25%, severe LV dysfunction with global HK, moderate LVH, grade III DD (restrictive), RV severely reduced, moderate MR  - RHC (1/23): mean RA 8, PA 54/17 mean 32, mean PCWP 18, CI 2.54, PVR 2.8 WU (pulmonary venous hypertension).  - Echo (4/23): EF 25-30% with moderate LVH and normal RV, mild MR. 9. OSA: Mild on 2/23 sleep study.    Current Outpatient Medications  Medication Sig Dispense Refill   aspirin (ASPIRIN CHILDRENS) 81 MG chewable tablet Chew 1 tablet (81 mg total) by mouth daily. 30 tablet 11   atorvastatin (LIPITOR) 80 MG tablet Take 1 tablet (80 mg total) by mouth daily. 90 tablet 3   Blood Glucose Monitoring Suppl (ONE TOUCH ULTRA 2) w/Device KIT Use to check blood glucose 2 times daily 1 kit 1   carvedilol (COREG) 12.5 MG tablet Take 1 tablet (12.5 mg total) by mouth 2 (two) times daily with a meal. 180 tablet 3   empagliflozin (JARDIANCE) 10 MG TABS tablet Take  1 tablet (10 mg total) by mouth daily before breakfast. 30 tablet 6   glipiZIDE (GLUCOTROL) 10 MG tablet Take 1 tablet (10 mg total) by mouth daily before breakfast. 90 tablet 1   glucose blood test strip Use twice daily to check blood sugars 100 each 12   hydrALAZINE (APRESOLINE) 50 MG tablet Take 1 tablet (50 mg total) by mouth 3 (three) times daily. 90 tablet 6   insulin glargine, 1 Unit Dial, (TOUJEO SOLOSTAR) 300 UNIT/ML Solostar Pen Inject 15 Units into the skin daily. 4.5 mL 3   Insulin Pen Needle (UNIFINE  PENTIPS) 32G X 4 MM MISC Use daily with insulin 100 each 2   isosorbide mononitrate (IMDUR) 30 MG 24 hr tablet Take 1 tablet (30 mg total) by mouth daily. 30 tablet 6   Lancets Micro Thin 33G MISC Use to check blood sugar twice daily 100 each 1   metFORMIN (GLUCOPHAGE-XR) 500 MG 24 hr tablet Take 1 tablet (500 mg total) by mouth 2 (two) times daily. 180 tablet 1   sacubitril-valsartan (ENTRESTO) 49-51 MG Take 1 tablet by mouth 2 (two) times daily. 60 tablet 6   torsemide (DEMADEX) 20 MG tablet Take 1 tablet (20 mg total) by mouth daily as needed for extra fluid. 30 tablet 6   No current facility-administered medications for this encounter.   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, Engineer, civil (consulting) "yard dog"  Tobacco Use   Smoking status: Every Day    Packs/day: 0.10    Years: 35.00    Pack years: 3.50    Types: Cigarettes    Start date: 09/02/2021   Smokeless tobacco: Never   Tobacco comments:    Last use 06/17/21. Pt smoked a pack a week  Vaping Use   Vaping Use: Never used  Substance and Sexual Activity   Alcohol use: Not Currently   Drug use: Never   Sexual activity: Not on file  Other Topics Concern   Not on file  Social History Narrative   Not on file   Social Determinants of Health   Financial Resource Strain: High Risk   Difficulty of Paying Living Expenses: Hard  Food Insecurity: Food Insecurity Present   Worried About [place] in the Last Year: Never true   Ran Out of Food in the Last Year: Sometimes true  Transportation Needs: No Transportation Needs   Lack of Transportation (Medical): No   Lack of Transportation (Non-Medical): No   Physical Activity: Not on file  Stress: Not on file  Social Connections: Not on file  Intimate Partner Violence: Not on file   Family History  Problem Relation Age of Onset   Healthy Mother    BP 130/78 (BP Location: Right Arm, Patient Position: Sitting)   Pulse (!) 55   Wt 83.4 kg (183 lb 12.8 oz)   SpO2 100%   BMI 26.37 kg/m   Wt Readings from Last 3 Encounters:  12/23/21 83.4 kg (183 lb 12.8 oz)  12/17/21 81 kg (178 lb 9.6 oz)  12/11/21 81.6 kg (180 lb)   PHYSICAL EXAM: General:  NAD.  No resp difficulty HEENT: Normal Neck: Supple. No JVD. Carotids 2+ bilat; no bruits. No lymphadenopathy or thryomegaly appreciated. Cor: PMI nondisplaced. Regular rate & rhythm. No rubs, gallops or murmurs. Lungs: Clear Abdomen: Soft, nontender, nondistended. No hepatosplenomegaly. No bruits or masses. Good bowel sounds. Extremities: No cyanosis, clubbing, rash, edema Neuro: Alert & oriented x 3, cranial nerves grossly intact. Moves all 4 extremities w/o difficulty. Affect pleasant.  ASSESSMENT & PLAN: 1. HTN: Long history of poor control.  Much improved today.   - Continue hydralazine 50 mg tid (he says 75 mg daily causes HA).   - Continue Coreg 12.5 mg bid - Continue Entresto 49/51 bid today.  BMET today.   - Renal artery dopplers (5/23) showed mild renal artery stenosis. 2. 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 Echo (11/22) w/ severe biventricular dysfunction, LVEF 20-25%, Mod LVH, GIIIDD (restrictive), RV severely reduced, diffuse HK. Underwent R/LHC (1/23) showing significant CAD but no good targets for CABG. Echo in 4/23 showed EF persistently low at 25-30%. NYHA class II, he is not volume overloaded today. - Continue Jardiance 10 mg daily. No GU symptoms. - Continue Coreg 12.5 mg bid.  - Continue Entresto 49/51 bid, check BMET today.  Will not increase further with CKD. - Continue torsemide 20 mg every 3rd day. - Continue hydralazine 50 mg  tid (cannot tolerate higher dose).  - Continue Imdur 30 mg daily.    - Repeat echo in 2 months, ICD if EF low.  3. CAD: LHC (1/23) with diffuse disease occluded large ramus and moderate D1. 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 No good PCI options in setting of diffuse disease and no chest pain, and he does not have good targets for CABG (would be high risk CABG with low EF).  Medically manage. Still denies chest pain.  - Continue ASA + statin. 4. CKD, Stage III: Referred to Nephrology. - BMET today, follow renal function closely. 5. Hyperlipidemia: LDL still high on atorvastatin 80 mg daily.  - Recommended starting Vascepa 2 g bid with elevated TGs however this has not been started. Will reach out to pharmacy, may need to try fenofibrate first, but would be hesitant with renal function. - He is followed by the Lipid clinic for [place], awaiting insurance approval.  6. Left renal mass: seen on renal ultrasound 5/23, awaiting non-contract CT abdomen to further evaluate.  Follow up in 2 months with Dr. Aundra Dubin with repeat echo.   He brought in his disability forms today, placed in folder.  Maricela Bo [facility] FNP-BC 12/23/2021

## 2021-12-23 ENCOUNTER — Other Ambulatory Visit (HOSPITAL_COMMUNITY): Payer: Self-pay

## 2021-12-23 ENCOUNTER — Telehealth (HOSPITAL_COMMUNITY): Payer: Self-pay | Admitting: *Deleted

## 2021-12-23 ENCOUNTER — Ambulatory Visit (HOSPITAL_COMMUNITY)
Admission: RE | Admit: 2021-12-23 | Discharge: 2021-12-23 | Disposition: A | Discharge status: Home / Self Care | Payer: 59 | Source: Ambulatory Visit | Attending: Family Medicine | Admitting: Family Medicine

## 2021-12-23 ENCOUNTER — Encounter (HOSPITAL_COMMUNITY): Payer: Self-pay

## 2021-12-23 VITALS — BP 130/78 | HR 55 | Wt 183.8 lb

## 2021-12-23 DIAGNOSIS — I5022 Chronic systolic (congestive) heart failure: Secondary | ICD-10-CM | POA: Insufficient documentation

## 2021-12-23 DIAGNOSIS — Z7984 Long term (current) use of oral hypoglycemic drugs: Secondary | ICD-10-CM | POA: Diagnosis not present

## 2021-12-23 DIAGNOSIS — Z79899 Other long term (current) drug therapy: Secondary | ICD-10-CM | POA: Diagnosis not present

## 2021-12-23 DIAGNOSIS — Z87891 Personal history of nicotine dependence: Secondary | ICD-10-CM | POA: Insufficient documentation

## 2021-12-23 DIAGNOSIS — I428 Other cardiomyopathies: Secondary | ICD-10-CM | POA: Insufficient documentation

## 2021-12-23 DIAGNOSIS — N2889 Other specified disorders of kidney and ureter: Secondary | ICD-10-CM | POA: Insufficient documentation

## 2021-12-23 DIAGNOSIS — N1832 Chronic kidney disease, stage 3b: Secondary | ICD-10-CM

## 2021-12-23 DIAGNOSIS — Z7902 Long term (current) use of antithrombotics/antiplatelets: Secondary | ICD-10-CM | POA: Diagnosis not present

## 2021-12-23 DIAGNOSIS — N183 Chronic kidney disease, stage 3 unspecified: Secondary | ICD-10-CM | POA: Insufficient documentation

## 2021-12-23 DIAGNOSIS — I10 Essential (primary) hypertension: Secondary | ICD-10-CM | POA: Diagnosis not present

## 2021-12-23 DIAGNOSIS — E785 Hyperlipidemia, unspecified: Secondary | ICD-10-CM | POA: Diagnosis not present

## 2021-12-23 DIAGNOSIS — I701 Atherosclerosis of renal artery: Secondary | ICD-10-CM | POA: Diagnosis not present

## 2021-12-23 DIAGNOSIS — I2510 Atherosclerotic heart disease of native coronary artery without angina pectoris: Secondary | ICD-10-CM | POA: Diagnosis not present

## 2021-12-23 DIAGNOSIS — I130 Hypertensive heart and chronic kidney disease with heart failure and stage 1 through stage 4 chronic kidney disease, or unspecified chronic kidney disease: Secondary | ICD-10-CM | POA: Insufficient documentation

## 2021-12-23 DIAGNOSIS — Z7901 Long term (current) use of anticoagulants: Secondary | ICD-10-CM | POA: Diagnosis not present

## 2021-12-23 DIAGNOSIS — E1122 Type 2 diabetes mellitus with diabetic chronic kidney disease: Secondary | ICD-10-CM | POA: Diagnosis not present

## 2021-12-23 LAB — BASIC METABOLIC PANEL
Anion gap: 7 (ref 5–15)
BUN: 22 mg/dL (ref 8–23)
CO2: 23 mmol/L (ref 22–32)
Calcium: 9 mg/dL (ref 8.9–10.3)
Chloride: 107 mmol/L (ref 98–111)
Creatinine, Ser: 1.7 mg/dL — ABNORMAL HIGH (ref 0.61–1.24)
GFR, Estimated: 45 mL/min — ABNORMAL LOW (ref >60)
Glucose, Bld: 237 mg/dL — ABNORMAL HIGH (ref 70–99)
Potassium: 5 mmol/L (ref 3.5–5.1)
Sodium: 137 mmol/L (ref 135–145)

## 2021-12-23 NOTE — Patient Instructions (Addendum)
Thank you for coming in today  Labs were done today, if any labs are abnormal the clinic will call you No news is good news  Your physician recommends that you schedule a follow-up appointment in:  2 months with Dr. Shirlee Latch with echocardiogram  Your physician has requested that you have an echocardiogram. Echocardiography is a painless test that uses sound waves to create images of your heart. It provides your doctor with information about the size and shape of your heart and how well your heart's chambers and valves are working. This procedure takes approximately one hour. There are no restrictions for this procedure.   At the [facility], you and your health needs are our priority. As part of our continuing mission to provide you with exceptional heart care, we have created designated Provider Care Teams. These Care Teams include your primary Cardiologist (physician) and Advanced Practice Providers (APPs- Physician Assistants and Nurse Practitioners) who all work together to provide you with the care you need, when you need it.   You may see any of the following providers on your designated Care Team at your next follow up: Dr Arvilla Meres Dr Carron Curie, NP Robbie Lis, [place] [facility] [place], [place] Karle Plumber, PharmD   Please be sure to bring in all your medications bottles to every appointment.   If you have any questions or concerns before your next appointment please send [place] a message through [place] or call our office at [phone].    TO LEAVE A MESSAGE FOR THE NURSE SELECT OPTION 2, PLEASE LEAVE A MESSAGE INCLUDING: YOUR NAME DATE OF BIRTH CALL BACK NUMBER REASON FOR CALL**this is important as we prioritize the call backs  YOU WILL RECEIVE A CALL BACK THE SAME DAY AS LONG AS YOU CALL BEFORE 4:00 PM

## 2021-12-26 ENCOUNTER — Encounter: Payer: Self-pay | Admitting: Pharmacist

## 2021-12-26 ENCOUNTER — Ambulatory Visit (INDEPENDENT_AMBULATORY_CARE_PROVIDER_SITE_OTHER): Payer: 59 | Admitting: Pharmacist

## 2021-12-26 VITALS — BP 150/98 | HR 74 | Wt 187.6 lb

## 2021-12-26 DIAGNOSIS — I5041 Acute combined systolic (congestive) and diastolic (congestive) heart failure: Secondary | ICD-10-CM | POA: Diagnosis not present

## 2021-12-26 DIAGNOSIS — E1165 Type 2 diabetes mellitus with hyperglycemia: Secondary | ICD-10-CM | POA: Diagnosis not present

## 2021-12-26 NOTE — Patient Instructions (Addendum)
It was good seeing you again.  I am glad you are feeling better!  Please continue your:  Carvedilol 12.5mg  twice a day Jardiance 10mg  daily Hydralazine 50mg  three times a day Entresto 49-51 mg twice a day Isosorbide 30mg  daily  Continue you insulin daily and I will have the primary care office call you to schedule an appointment  Please call with any questions!  , PharmD, BCACP, CDCES, CPP [address], [address] [place], [place], [address] Phone: [phone], Fax: [phone]

## 2021-12-26 NOTE — Progress Notes (Signed)
   S:   DM  Patient referred by Dr Aundra Dubin for lipid referral. PMH significant for HF, T2DM, HTN, HLD and tobacco use.  At appointment last week patient complained of symptoms consistent wih uncontrolled DM. Advised patient should have A1c checked to confirm DM still controlled.  Lab work showed A1c >15.0%.    At last visit patient was started on Toujeo 15units once daily and instructed to establish with primary care.  Testing supplies were sent to pharmacy. Has picked up insulin and testing supplies but has not scheduled PCP visit yet. He reports it is due to having to establish with Medicare.  Patient presents today feeling much better.  Has more energy and was able to cut his lawn this week.  Pain in his stomach has gone away and he is not awakening multiple times during the night to urinate.  Has begun to put back on weight.  Reports fasting blood sugars are now in 140s, but did not bring meter or log.  In room BG check: 157.  At last visit, in room BG check was 479.   Preferred [place]  -Current CHF medications Entresto 49-51mg  BID Carvedilol 12.5mg  BiD Jardiance 10mg  daily Hydralazine 50mg  TID Isosorbide 30mg  daily Torsemide 20mg  PRN  -Current lipid medications Atorvastatin 80mg  daily  -Current diabetes medications include: Metformin 500mg  BID Glipizide 10mg  daily Jardiance 10mg  daily Toujeo 15 units daily   HbA1c Lab Results  Component Value Date   HGBA1C 15.0 (H) 12/11/2021   HGBA1C 6.9 (H) 06/19/2021   HGBA1C 7.0 (A) 03/27/2021    Lipids    Component Value Date/Time   CHOL 258 (H) 11/03/2021 1248   TRIG 409 (H) 11/03/2021 1248   HDL 31 (L) 11/03/2021 1248   CHOLHDL 8.3 11/03/2021 1248   VLDL UNABLE TO CALCULATE IF TRIGLYCERIDE OVER 400 mg/dL 11/03/2021 1248   LDLCALC UNABLE TO CALCULATE IF TRIGLYCERIDE OVER 400 mg/dL 11/03/2021 1248   LDLDIRECT 122.9 (H) 11/03/2021 1248    Assessment:  Patient feel much better since starting  insulin and reports blood sugar has reduced and he now has more energy.  Will sent message to [facility] scheduling to set him up with PCP.   Now that blood sugar is reducing, likely triglycerides and LDL may also reduce.  Will recheck lipid panel in 2-3 months to verify if he still requires PCSK9i.    Renal function also improving since starting insulin  BP in room today elevated at 150/98. Recommend increasing Entresto to max dose however patient is hesitant at this time because he says he feels better now.  Has echo scheduled in August and would like to see results before increasing Entresto.  Plan: Continue metformin 500mg  BID Continue Jardiance 10mg  daily Continue glipizide 10mg  daily Continue Toujeo 15 units once daily Continue Atorvastatin 80mg  daily Continue Entresto 49-51mg  BID Continue Hydralazine 50mg  TID Continue Isosorbide 30mg  daily Continue Torsemide 20mg  PRN Send message to [facility] scheduling   Karren Cobble, PharmD, [place], [place], [place], [place], [place], [address] Phone: [phone], Fax: [phone]

## 2021-12-30 ENCOUNTER — Other Ambulatory Visit (HOSPITAL_BASED_OUTPATIENT_CLINIC_OR_DEPARTMENT_OTHER): Payer: Self-pay

## 2021-12-30 ENCOUNTER — Encounter (HOSPITAL_COMMUNITY): Payer: Self-pay

## 2021-12-30 ENCOUNTER — Telehealth (HOSPITAL_COMMUNITY): Payer: Self-pay | Admitting: Vascular Surgery

## 2022-01-07 ENCOUNTER — Ambulatory Visit (HOSPITAL_COMMUNITY)
Admission: RE | Admit: 2022-01-07 | Discharge: 2022-01-07 | Disposition: A | Discharge status: Home / Self Care | Payer: 59 | Source: Ambulatory Visit | Attending: Cardiology | Admitting: Cardiology

## 2022-01-07 DIAGNOSIS — N2889 Other specified disorders of kidney and ureter: Secondary | ICD-10-CM | POA: Insufficient documentation

## 2022-01-09 ENCOUNTER — Other Ambulatory Visit (HOSPITAL_BASED_OUTPATIENT_CLINIC_OR_DEPARTMENT_OTHER): Payer: Self-pay

## 2022-01-15 ENCOUNTER — Other Ambulatory Visit (HOSPITAL_BASED_OUTPATIENT_CLINIC_OR_DEPARTMENT_OTHER): Payer: Self-pay

## 2022-01-16 ENCOUNTER — Other Ambulatory Visit (HOSPITAL_BASED_OUTPATIENT_CLINIC_OR_DEPARTMENT_OTHER): Payer: Self-pay

## 2022-01-27 ENCOUNTER — Telehealth: Payer: Self-pay | Admitting: Licensed Clinical Social Worker

## 2022-01-27 NOTE — Telephone Encounter (Signed)
H&V Care Navigation CSW Progress Note  Clinical Social Worker contacted patient by phone to f/u on primary care appointment. Attempted pt at [phone]. No answer, voicemail left with details of upcoming appt, also mailed pt information about appointments upcoming and [facility] flyer. Remain available as needed.   Patient is participating in a Managed Medicaid Plan:  No, UHC Medicare only  SDOH Screenings   Alcohol Screen: Low Risk  (06/20/2021)   Alcohol Screen    Last Alcohol Screening Score (AUDIT): 0  Depression (PHQ2-9): Low Risk  (03/27/2021)   Depression (PHQ2-9)    PHQ-2 Score: 0  Financial Resource Strain: High Risk (06/20/2021)   Overall Financial Resource Strain (CARDIA)    Difficulty of Paying Living Expenses: Hard  Food Insecurity: Food Insecurity Present (06/20/2021)   Hunger Vital Sign    Worried About Running Out of Food in the Last Year: Never true    Ran Out of Food in the Last Year: Sometimes true  Housing: Low Risk  (06/20/2021)   Housing    Last Housing Risk Score: 0  Physical Activity: Not on file  Social Connections: Not on file  Stress: Not on file  Tobacco Use: High Risk (12/26/2021)   Patient History    Smoking Tobacco Use: Every Day    Smokeless Tobacco Use: Never    Passive Exposure: Not on file  Transportation Needs: No Transportation Needs (06/20/2021)   PRAPARE - Transportation    Lack of Transportation (Medical): No    Lack of Transportation (Non-Medical): No   Octavio Graves, MSW, LCSW Clinical Social Worker [facility] Heart/Vascular Care Navigation  [phone]- work cell phone (preferred) [phone]- desk phone

## 2022-02-02 ENCOUNTER — Other Ambulatory Visit (HOSPITAL_BASED_OUTPATIENT_CLINIC_OR_DEPARTMENT_OTHER): Payer: Self-pay

## 2022-02-11 ENCOUNTER — Other Ambulatory Visit (HOSPITAL_COMMUNITY): Payer: Self-pay

## 2022-02-11 ENCOUNTER — Other Ambulatory Visit (HOSPITAL_BASED_OUTPATIENT_CLINIC_OR_DEPARTMENT_OTHER): Payer: Self-pay

## 2022-02-12 ENCOUNTER — Telehealth (HOSPITAL_COMMUNITY): Payer: Self-pay | Admitting: Licensed Clinical Social Worker

## 2022-02-12 ENCOUNTER — Other Ambulatory Visit (HOSPITAL_COMMUNITY): Payer: Self-pay

## 2022-02-12 ENCOUNTER — Other Ambulatory Visit (HOSPITAL_COMMUNITY): Payer: Self-pay | Admitting: *Deleted

## 2022-02-12 MED ORDER — TORSEMIDE 20 MG PO TABS
20.0000 mg | ORAL_TABLET | Freq: Every day | ORAL | 3 refills | Status: DC | PRN
  Filled 2022-02-12 – 2022-02-20 (×2): qty 30, 30d supply, fill #0

## 2022-02-12 MED ORDER — HYDRALAZINE HCL 50 MG PO TABS
50.0000 mg | ORAL_TABLET | Freq: Three times a day (TID) | ORAL | 3 refills | Status: DC
  Filled 2022-02-12 – 2022-04-01 (×3): qty 90, 30d supply, fill #0
  Filled 2022-04-30: qty 90, 30d supply, fill #1
  Filled 2022-09-28: qty 90, 30d supply, fill #0

## 2022-02-12 MED ORDER — ATORVASTATIN CALCIUM 80 MG PO TABS
80.0000 mg | ORAL_TABLET | Freq: Every day | ORAL | 3 refills | Status: DC
  Filled 2022-02-12 – 2022-04-01 (×3): qty 30, 30d supply, fill #0
  Filled 2022-04-30: qty 30, 30d supply, fill #1

## 2022-02-12 MED ORDER — CARVEDILOL 12.5 MG PO TABS
12.5000 mg | ORAL_TABLET | Freq: Two times a day (BID) | ORAL | 3 refills | Status: DC
  Filled 2022-02-12 – 2022-04-01 (×3): qty 60, 30d supply, fill #0
  Filled 2022-04-30: qty 60, 30d supply, fill #1

## 2022-02-12 MED ORDER — EMPAGLIFLOZIN 10 MG PO TABS
10.0000 mg | ORAL_TABLET | Freq: Every day | ORAL | 3 refills | Status: DC
Start: 2022-02-12 — End: 2022-09-09
  Filled 2022-02-12 – 2022-09-08 (×6): qty 30, 30d supply, fill #0

## 2022-02-12 MED ORDER — ISOSORBIDE MONONITRATE ER 30 MG PO TB24
30.0000 mg | ORAL_TABLET | Freq: Every day | ORAL | 3 refills | Status: DC
  Filled 2022-02-12 – 2022-04-01 (×3): qty 30, 30d supply, fill #0
  Filled 2022-04-30: qty 30, 30d supply, fill #1

## 2022-02-12 NOTE — Progress Notes (Signed)
Heart and Vascular Care Navigation  02/12/2022  ANJELO PULLMAN [birth year] [id number]  Reason for Referral: pt called regarding concerns with affording medications as his insurance has lapsed   Engaged with patient by telephone for initial visit for Heart and Vascular Care Coordination.                                                                                                   Assessment:     Pt called CSW and informed that insurance has now stopped.  Reports he had to stop work in Nov and has been on STD since that time- STD stopped last month as well as insurance- confirmed with patient advocate that insurance stopped on 6/30.  Pt now wondering how he can get his meds.  CSW had clinic  staff send all eligible meds to [facility] under HF fund- CSW informed pt of medications being sent and where to get them- pt expressed understanding.  CSW spoke with pt current pharmacy located at [facility] location and informed of pt loss of insurance- they do not offer assistance but will help pt find options to help with costs- were able to locate a coupon to get pt his insulin for $35 for 3 month supply- pt informed and provided $50 in gift cards to help pt get meds this month.     Pt reports large amount of financial strain with his current income amount and housing costs.  Just informed his rent is increasing to $550 which will make it difficult to stay on top of things.  Has family who has helped with some costs- unsure how he will pay $196 overdue for electric bill- CSW submitting check request through patient care fund to assist with this.                                 HRT/VAS Care Coordination     Patients Home Cardiology Office Heart Failure Clinic   Outpatient Care Team Social Worker   Social Worker Name: Naaman Plummer, [phone]   Living arrangements for the past 2 months Single Family Home   Lives with: Self  Daniel Nones- girlfriend very supportive   Patient Current  Insurance Coverage Self-Pay; Medicaid Pending   Patient Has Concern With Paying Medical Bills Yes   Does Patient Have Prescription Coverage? No   Patient Prescription Assistance Programs Heart Failure Fund; Patient Assistance Programs   Home Assistive Devices/Equipment None       Social History:                                                                             SDOH Screenings   Alcohol Screen: Low Risk  (06/20/2021)   Alcohol Screen  Last Alcohol Screening Score (AUDIT): 0  Depression (PHQ2-9): Low Risk  (03/27/2021)   Depression (PHQ2-9)    PHQ-2 Score: 0  Financial Resource Strain: High Risk (02/12/2022)   Overall Financial Resource Strain (CARDIA)    Difficulty of Paying Living Expenses: Very hard  Food Insecurity: Food Insecurity Present (06/20/2021)   Hunger Vital Sign    Worried About Running Out of Food in the Last Year: Never true    Ran Out of Food in the Last Year: Sometimes true  Housing: High Risk (02/12/2022)   Housing    Last Housing Risk Score: 2  Physical Activity: Not on file  Social Connections: Not on file  Stress: Not on file  Tobacco Use: High Risk (12/26/2021)   Patient History    Smoking Tobacco Use: Every Day    Smokeless Tobacco Use: Never    Passive Exposure: Not on file  Transportation Needs: No Transportation Needs (06/20/2021)   PRAPARE - Transportation    Lack of Transportation (Medical): No    Lack of Transportation (Non-Medical): No    SDOH Interventions: Financial Resources:  Financial Strain Interventions: Other (Comment) (Patient Care Fund referral) Receives $939/month  Food Insecurity:   Not assessed  Housing Insecurity:  Housing Interventions: Other (Comment) (Patient Care Fund referral) also provided with income based housing list  Transportation:   Has his own car   Follow-up plan:    Awaiting determination from [employer] for [employer] assistance.  Check request submitted to accounts payable for help with electric  bill  Will continue to follow and assist as needed  Burna Sis, LCSW Clinical Social Worker [facility] Desk#: [phone] Cell#: [phone]

## 2022-02-13 ENCOUNTER — Other Ambulatory Visit (HOSPITAL_COMMUNITY): Payer: Self-pay

## 2022-02-13 ENCOUNTER — Telehealth (HOSPITAL_COMMUNITY): Payer: Self-pay | Admitting: Pharmacy Technician

## 2022-02-13 MED ORDER — SACUBITRIL-VALSARTAN 49-51 MG PO TABS: 1.0000 | ORAL_TABLET | Freq: Two times a day (BID) | ORAL | 3 refills | Status: DC

## 2022-02-13 NOTE — Telephone Encounter (Signed)
Advanced Heart Failure Patient Advocate Encounter   Patient was approved to receive Entresto from [employer]  Effective dates: 02/13/22 through 02/14/23  Document scanned to chart.   Archer Asa, CPhT

## 2022-02-16 ENCOUNTER — Other Ambulatory Visit (HOSPITAL_BASED_OUTPATIENT_CLINIC_OR_DEPARTMENT_OTHER): Payer: Self-pay

## 2022-02-18 NOTE — Telephone Encounter (Signed)
Encounter open in error 

## 2022-02-20 ENCOUNTER — Other Ambulatory Visit (HOSPITAL_COMMUNITY): Payer: Self-pay

## 2022-02-26 ENCOUNTER — Ambulatory Visit: Payer: 59 | Admitting: Family Medicine

## 2022-02-27 ENCOUNTER — Other Ambulatory Visit (HOSPITAL_BASED_OUTPATIENT_CLINIC_OR_DEPARTMENT_OTHER): Payer: Self-pay

## 2022-03-04 ENCOUNTER — Other Ambulatory Visit (HOSPITAL_BASED_OUTPATIENT_CLINIC_OR_DEPARTMENT_OTHER): Payer: Self-pay

## 2022-03-04 ENCOUNTER — Ambulatory Visit (HOSPITAL_COMMUNITY)
Admission: RE | Admit: 2022-03-04 | Discharge: 2022-03-04 | Disposition: A | Discharge status: Home / Self Care | Payer: Medicaid Other | Source: Ambulatory Visit | Attending: Cardiology | Admitting: Cardiology

## 2022-03-04 ENCOUNTER — Ambulatory Visit (HOSPITAL_BASED_OUTPATIENT_CLINIC_OR_DEPARTMENT_OTHER)
Admission: RE | Admit: 2022-03-04 | Discharge: 2022-03-04 | Disposition: A | Discharge status: Home / Self Care | Payer: Medicaid Other | Source: Ambulatory Visit | Attending: Cardiology | Admitting: Cardiology

## 2022-03-04 ENCOUNTER — Encounter (HOSPITAL_COMMUNITY): Payer: Self-pay | Admitting: Cardiology

## 2022-03-04 VITALS — BP 120/70 | HR 53 | Wt 188.0 lb

## 2022-03-04 DIAGNOSIS — I358 Other nonrheumatic aortic valve disorders: Secondary | ICD-10-CM

## 2022-03-04 DIAGNOSIS — I517 Cardiomegaly: Secondary | ICD-10-CM

## 2022-03-04 DIAGNOSIS — I5022 Chronic systolic (congestive) heart failure: Secondary | ICD-10-CM | POA: Diagnosis not present

## 2022-03-04 DIAGNOSIS — I77810 Thoracic aortic ectasia: Secondary | ICD-10-CM | POA: Diagnosis not present

## 2022-03-04 DIAGNOSIS — I5030 Unspecified diastolic (congestive) heart failure: Secondary | ICD-10-CM | POA: Diagnosis not present

## 2022-03-04 DIAGNOSIS — E119 Type 2 diabetes mellitus without complications: Secondary | ICD-10-CM | POA: Insufficient documentation

## 2022-03-04 DIAGNOSIS — I110 Hypertensive heart disease with heart failure: Secondary | ICD-10-CM | POA: Diagnosis not present

## 2022-03-04 DIAGNOSIS — I5041 Acute combined systolic (congestive) and diastolic (congestive) heart failure: Secondary | ICD-10-CM

## 2022-03-04 DIAGNOSIS — I5043 Acute on chronic combined systolic (congestive) and diastolic (congestive) heart failure: Secondary | ICD-10-CM

## 2022-03-04 LAB — BASIC METABOLIC PANEL
Anion gap: 10 (ref 5–15)
BUN: 27 mg/dL — ABNORMAL HIGH (ref 8–23)
CO2: 25 mmol/L (ref 22–32)
Calcium: 9.5 mg/dL (ref 8.9–10.3)
Chloride: 104 mmol/L (ref 98–111)
Creatinine, Ser: 2.16 mg/dL — ABNORMAL HIGH (ref 0.61–1.24)
GFR, Estimated: 34 mL/min — ABNORMAL LOW (ref >60)
Glucose, Bld: 227 mg/dL — ABNORMAL HIGH (ref 70–99)
Potassium: 4.2 mmol/L (ref 3.5–5.1)
Sodium: 139 mmol/L (ref 135–145)

## 2022-03-04 LAB — ECHOCARDIOGRAM COMPLETE
AR max vel: 3.07 cm2
AV Area VTI: 3.33 cm2
AV Area mean vel: 2.99 cm2
AV Mean grad: 4 mmHg
AV Peak grad: 7.2 mmHg
Ao pk vel: 1.34 m/s
Area-P 1/2: 2.88 cm2
S' Lateral: 2.9 cm

## 2022-03-04 LAB — BRAIN NATRIURETIC PEPTIDE: B Natriuretic Peptide: 38.6 pg/mL (ref 0.0–100.0)

## 2022-03-04 MED ORDER — SPIRONOLACTONE 25 MG PO TABS
12.5000 mg | ORAL_TABLET | Freq: Every day | ORAL | 3 refills | Status: DC
  Filled 2022-03-04: qty 45, 90d supply, fill #0

## 2022-03-04 NOTE — Patient Instructions (Addendum)
Start Spironolactone 12.5mg  daily  Labs done today, your results will be available in MyChart, we will contact you for abnormal readings.  Repeat blood work in 10 days  Your physician has requested that you have a cardiac MRI. Cardiac MRI uses a computer to create images of your heart as its beating, producing both still and moving pictures of your heart and major blood vessels. For further information please visit [email or web address]. Please follow the instruction sheet given to you today for more information. ONCE APPROVED BY YOUR INSURANCE COMPANY YOU WILL BE CALLED TO ARRANGE THE TEST.  Please follow up with our heart failure pharmacist as scheduled.  Please call  [phone] which is patient care center number to set up new appt. Eileen Stanford our Child psychotherapist will call you this afternoon to help you.  Your physician recommends that you schedule a follow-up appointment in: 2 months  If you have any questions or concerns before your next appointment please send [place] a message through [place] or call our office at [phone].    TO LEAVE A MESSAGE FOR THE NURSE SELECT OPTION 2, PLEASE LEAVE A MESSAGE INCLUDING: YOUR NAME DATE OF BIRTH CALL BACK NUMBER REASON FOR CALL**this is important as we prioritize the call backs  YOU WILL RECEIVE A CALL BACK THE SAME DAY AS LONG AS YOU CALL BEFORE 4:00 PM  At the [facility], you and your health needs are our priority. As part of our continuing mission to provide you with exceptional heart care, we have created designated Provider Care Teams. These Care Teams include your primary Cardiologist (physician) and Advanced Practice Providers (APPs- Physician Assistants and Nurse Practitioners) who all work together to provide you with the care you need, when you need it.   You may see any of the following providers on your designated Care Team at your next follow up: Dr Arvilla Meres Dr Carron Curie, NP Robbie Lis,  [place] [facility] [place], [place] Karle Plumber, PharmD   Please be sure to bring in all your medications bottles to every appointment.

## 2022-03-04 NOTE — Progress Notes (Signed)
*  PRELIMINARY RESULTS* Echocardiogram 2D Echocardiogram has been performed.  Joanette Gula Kiefer [place] 03/04/2022, 10:44 AM

## 2022-03-05 ENCOUNTER — Other Ambulatory Visit (HOSPITAL_BASED_OUTPATIENT_CLINIC_OR_DEPARTMENT_OTHER): Payer: Self-pay

## 2022-03-05 ENCOUNTER — Telehealth (HOSPITAL_COMMUNITY): Payer: Self-pay | Admitting: Licensed Clinical Social Worker

## 2022-03-05 NOTE — Progress Notes (Signed)
ADVANCED HF CLINIC NOTE   Primary Care: Pcp, No Primary Cardiologist: Dr. Irish Lack HF Cardiologist: Dr. Aundra Dubin  HPI: Rodney Schultz is a 61 y.o. AAM w/ h/o HTN, T2DM and HLD, admitted to [facility] w/ new systolic heart failure in 11/22.    He reports long history of poorly controlled HTN in the past. Baseline SBPs would average in the 170s. Denies ETOH use. Former smoker but has quit. No known family h/o CHF. No h/o ischemic-like CP. No known h/o COVID but reports having the flu prior to his 11/22 admission.     Admitted 11/22 w/ new CHF, hypertensive + AKI w/ SCr in the 2.0 range (previous baseline ~1.5). Echo w/ severe biventricular dysfunction, LVEF 20-25% with diffuse hypokinesis, mod LVH, GIIIDD (restrictive), RV severely reduced, moderate MR. R/LHC was deferred initially due to renal insuffiency. He was diuresed w/ IV Lasix. GDMT w/ hydralazine, Imdur and Coreg initiated. Discharge wt 177 lb. Referred to TOC.    Seen 1/23 in HF clinic to establish care for his CHF. R/LHC showed mildly elevated PCWP, pulmonary venous hypertension, preserved CO and diffuse CAD w/ occluded large ramus and moderate D1. 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 No good PCI options and he does not have good targets for CABG (would be high risk CABG with low EF).  Medically managed in setting of no anginal chest pain.   Echo in 4/23 showed EF 25-30% with moderate LVH and normal RV, mild MR. Echo was done today and reviewed, EF 35% with diffuse hypokinesis and akinesis of the mid inferolateral and mid anterolateral walls, normal RV.   Patient returns for followup of CHF.  BP is now controlled.  He says that glucose is also better-controlled.  He is taking torsemide every other day.  No dyspnea, able to do yardwork likely cutting the grass without problems.  No chest pain.  No orthopnea/PND.    ECG (personally reviewed): NSR, anterolateral TWIs  Labs (1/23): K 4.3, creatinine 1.95 Labs  (2/23): K 4.4, creatinine 1.75 Labs (4/23): K 4.4, creatinine 2.27, BNP 45, LDL 123, TGs 409 Labs (6/23): creatinine 1.7  PMH: 1. Type 2 diabetes 2. HTN: Poorly controlled.  - Renal artery dopplers: Mild renal artery stenosis.   3. GERD 4. CKD stage 3 5. Hyperlipidemia 6. Former smoker 7. CAD: LHC (1/23) with occluded large ramu, occluded moderate D2, 90% dLAD, 80% dLCx, diffuse RCA disease reaching 80% in the mid vessel and 80% in the distal vessel.  No good targets for CABG, and reviewed by interventional cardiology who thought no good interventional targets given absence of anginal pain.  8. 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  - Echo (11/22): EF 20-25%, severe LV dysfunction with global HK, moderate LVH, grade III DD (restrictive), RV severely reduced, moderate MR  - RHC (1/23): mean RA 8, PA 54/17 mean 32, mean PCWP 18, CI 2.54, PVR 2.8 WU (pulmonary venous hypertension).  - Echo (4/23): EF 25-30% with moderate LVH and normal RV, mild MR. - Echo (8/23): EF 35% with diffuse hypokinesis and akinesis of the mid inferolateral and mid anterolateral walls, normal RV.  9. OSA: Mild on 2/23 sleep study.  10. Left renal mass: Likely cyst by renal US.    Current Outpatient Medications  Medication Sig Dispense Refill   aspirin (ASPIRIN CHILDRENS) 81 MG chewable tablet Chew 1 tablet (81 mg total) by mouth daily. 30 tablet 11   atorvastatin (LIPITOR) 80 MG tablet Take 1 tablet (80  mg total) by mouth daily. 30 tablet 3   Blood Glucose Monitoring Suppl (ONE TOUCH ULTRA 2) w/Device KIT Use to check blood glucose 2 times daily 1 kit 1   carvedilol (COREG) 12.5 MG tablet Take 1 tablet (12.5 mg total) by mouth 2 (two) times daily with a meal. 180 tablet 3   empagliflozin (JARDIANCE) 10 MG TABS tablet Take 1 tablet (10 mg total) by mouth daily before breakfast. 30 tablet 3   glipiZIDE (GLUCOTROL) 10 MG tablet Take 1 tablet (10 mg total) by mouth daily before  breakfast. 90 tablet 1   glucose blood test strip Use twice daily to check blood sugars 100 each 12   hydrALAZINE (APRESOLINE) 50 MG tablet Take 1 tablet (50 mg total) by mouth 3 (three) times daily. 90 tablet 3   insulin glargine, 1 Unit Dial, (TOUJEO SOLOSTAR) 300 UNIT/ML Solostar Pen Inject 15 Units into the skin daily. 4.5 mL 3   Insulin Pen Needle (UNIFINE PENTIPS) 32G X 4 MM MISC Use daily with insulin 100 each 2   isosorbide mononitrate (IMDUR) 30 MG 24 hr tablet Take 1 tablet (30 mg total) by mouth daily. 30 tablet 3   Lancets Micro Thin 33G MISC Use to check blood sugar twice daily 100 each 1   metFORMIN (GLUCOPHAGE-XR) 500 MG 24 hr tablet Take 1 tablet (500 mg total) by mouth 2 (two) times daily. 180 tablet 1   sacubitril-valsartan (ENTRESTO) 49-51 MG Take 1 tablet by mouth 2 (two) times daily. 180 tablet 3   spironolactone (ALDACTONE) 25 MG tablet Take 0.5 tablets (12.5 mg total) by mouth daily. 45 tablet 3   torsemide (DEMADEX) 20 MG tablet Take 1 tablet (20 mg total) by mouth daily as needed for extra fluid. 30 tablet 3   No current facility-administered medications for this encounter.   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 Pt smoked a pack a week  Vaping Use   Vaping Use: Never used  Substance and Sexual Activity   Alcohol use: Not Currently   Drug use: Never   Sexual activity: Not on file  Other Topics Concern    Not on file  Social History Narrative   Not on file   Social Determinants of Health   Financial Resource Strain: High Risk (02/12/2022)   Overall Financial Resource Strain (CARDIA)    Difficulty of Paying Living Expenses: Very hard  Food Insecurity: Food Insecurity Present (06/20/2021)   Hunger Vital Sign    Worried About Running Out of Food in the Last Year: Never true    Ran Out of Food in the Last Year: Sometimes true  Transportation Needs: No Transportation Needs (06/20/2021)   PRAPARE - Hydrologist (Medical): No    Lack of Transportation (Non-Medical):  No  Physical Activity: Not on file  Stress: Not on file  Social Connections: Not on file  Intimate Partner Violence: Not on file   Family History  Problem Relation Age of Onset   Healthy Mother    BP 120/70   Pulse (!) 53   Wt 85.3 kg (188 lb)   SpO2 100%   BMI 26.98 kg/m   Wt Readings from Last 3 Encounters:  03/04/22 85.3 kg (188 lb)  12/26/21 85.1 kg (187 lb 9.6 oz)  12/23/21 83.4 kg (183 lb 12.8 oz)   PHYSICAL EXAM: General: NAD Neck: No JVD, no thyromegaly or thyroid nodule.  Lungs: Clear to auscultation bilaterally with normal respiratory effort. CV: Nondisplaced PMI.  Heart regular S1/S2, no S3/S4, no murmur.  No peripheral edema.  No carotid bruit.  Normal pedal pulses.  Abdomen: Soft, nontender, no hepatosplenomegaly, no distention.  Skin: Intact without lesions or rashes.  Neurologic: Alert and oriented x 3.  Psych: Normal affect. Extremities: No clubbing or cyanosis.  HEENT: Normal.   ASSESSMENT & PLAN: 1. HTN: Long history of poor control.  Renal artery dopplers in 5/23 with mild renal artery stenosis.  Much improved BP today.  2. 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 Echo (11/22) w/ severe biventricular dysfunction, LVEF 20-25%, Mod LVH, GIIIDD (restrictive), RV severely reduced, diffuse HK. Underwent R/LHC (1/23) showing  significant CAD but no good targets for CABG.  Echo in 4/23 showed EF persistently low at 25-30%. Echo today showed EF 35% with diffuse hypokinesis and akinesis of the mid inferolateral and mid anterolateral walls, normal RV.  Weight is 5 lbs but he is not volume overloaded on exam. NYHA class I-II.   - Continue Jardiance 10 mg daily.  - Continue Coreg 12.5 mg bid.  - Continue Entresto 49/51 bid.  - Continue torsemide 20 mg qod.  - Continue hydralazine 50 mg tid (cannot tolerate higher dose).  - Continue Imdur 30 mg daily.    - Add spironolactone 12.5 mg daily.  BMET today and in 10 days.  - Echo is borderline for ICD (narrow QRS, not CRT candidate).  I will arrange for cardiac MRI to more closely assess LV EF.   3. CAD: LHC (1/23) with diffuse disease occluded large ramus and moderate D1. 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 No good PCI options in setting of diffuse disease and no chest pain, and he does not have good targets for CABG (would be high risk CABG with low EF).  Medically manage. Still denies chest pain.  - Continue ASA + statin. - LDL has been high, pharmacy clinic working on [place] for him.  4. CKD, Stage III: BMET today.  5. Hyperlipidemia: LDL still high on atorvastatin 80 mg daily.  - Continue atorvastatin 80 mg daily.  - Working on getting Repatha through lipid clinic.   Followup 3 wks with HF pharmacist for med titration then 2 months with APP.   Loralie Champagne 03/05/2022

## 2022-03-05 NOTE — Telephone Encounter (Signed)
Pt returned CSW call- reports he missed last appt because he did not have insurance and was worried about cost.  Pt now willing to go to PCP- reports he will call PCP office himself to get appt- CSW provided with phone number  Burna Sis, LCSW Clinical Social Worker [facility] Desk#: [phone] Cell#: [phone]

## 2022-03-05 NOTE — Telephone Encounter (Signed)
CSW consulted to follow up with pt regarding lack of PCP.  CSW attempted to call pt but unable to reach- left VM requesting return call.  CSW coworker had set pt up with PCP appt on 8/10 with [facility] at [facility] which he was a no show for- will attempt to get him rescheduled once able to reach him to discuss and assess for barriers to attending  Burna Sis, LCSW Clinical Social Worker [facility] Desk#: [phone] Cell#: [phone]

## 2022-03-06 ENCOUNTER — Other Ambulatory Visit (HOSPITAL_COMMUNITY): Payer: Self-pay | Admitting: *Deleted

## 2022-03-06 ENCOUNTER — Other Ambulatory Visit (HOSPITAL_BASED_OUTPATIENT_CLINIC_OR_DEPARTMENT_OTHER): Payer: Self-pay

## 2022-03-06 ENCOUNTER — Telehealth (HOSPITAL_COMMUNITY): Payer: Self-pay | Admitting: *Deleted

## 2022-03-06 DIAGNOSIS — I5041 Acute combined systolic (congestive) and diastolic (congestive) heart failure: Secondary | ICD-10-CM

## 2022-03-06 DIAGNOSIS — N1832 Chronic kidney disease, stage 3b: Secondary | ICD-10-CM

## 2022-03-06 NOTE — Telephone Encounter (Signed)
Called patient per Dr. Shirlee Latch, based on lab results from last visit, instructed patient to stop torsemide. Pt coming in for repeat BMP 03/18/22. Pt verbalized understanding of same.  Hessie Diener RN

## 2022-03-09 ENCOUNTER — Telehealth (HOSPITAL_COMMUNITY): Payer: Self-pay

## 2022-03-09 ENCOUNTER — Other Ambulatory Visit (HOSPITAL_BASED_OUTPATIENT_CLINIC_OR_DEPARTMENT_OTHER): Payer: Self-pay

## 2022-03-09 NOTE — Telephone Encounter (Addendum)
Pt aware, agreeable, and verbalized understanding  Labs scheduled   ----- Message from Laurey Morale, MD sent at 03/04/2022  5:07 PM EDT ----- Stop torsemide.  BMET 1 week.

## 2022-03-09 NOTE — Progress Notes (Signed)
Advanced Heart Failure Clinic Note   Primary Care: Pcp, No Primary Cardiologist: Dr. Eldridge Dace HF Cardiologist: Dr. Shirlee Latch    HPI:  Rodney Schultz is a 61 y.o. AAM w/ h/o HTN, T2DM and HLD, admitted to [facility] w/ new systolic heart failure in 05/2021.    He has reported long history of poorly controlled HTN in the past. Baseline SBPs would average in the 170s. Denies ETOH use. Former smoker but has quit. No known family h/o CHF. No h/o ischemic-like CP. No known h/o COVID but reports having the flu prior to his 05/2021 admission.     Admitted 05/2021 w/ new CHF, hypertensive + AKI w/ SCr in the 2.0 range (previous baseline ~1.5). Echo w/ severe biventricular dysfunction, LVEF 20-25% with diffuse hypokinesis, mod LVH, GIIIDD (restrictive), RV severely reduced, moderate MR. R/LHC was deferred initially due to renal insuffiency. He was diuresed w/ IV Lasix. GDMT w/ hydralazine, Imdur and carvedilol initiated. Discharge weight 177 lbs. Referred to TOC.    Seen 07/2021 in HF clinic to establish care for his CHF. R/LHC showed mildly elevated PCWP, pulmonary venous hypertension, preserved CO and diffuse CAD w/ occluded large ramus and moderate D1. 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 No good PCI options and he does not have good targets for CABG (would be high risk CABG with low EF).  Medically managed in setting of no anginal chest pain.    Echo in 10/2021 showed EF 25-30% with moderate LVH and normal RV, mild MR. Echo 02/2022 with EF 35% with diffuse hypokinesis and akinesis of the mid inferolateral and mid anterolateral walls, normal RV.    Patient returned to AHF Clinic for follow-up 03/04/22.  BP was controlled.  He stated that glucose was also better-controlled.  He reported taking torsemide every other day.  No dyspnea, able to do yardwork, likely cutting the grass without problems.  No chest pain.  No orthopnea/PND.    Today he returns to HF clinic for pharmacist medication  titration. At last visit with MD, spironolactone 12.5 mg daily was initiated. Additionally, torsemide was stopped. Overall he is feeling well today. Notes that spironolactone made him very sleepy when he first started it, but that has resolved. No dizziness or lightheadedness. BP in clinic was 172/96, repeat BP 10 minutes later was 154/94. States BP at home ~130s/70-80s. No CP or palpitations. No SOB/DOE. Has been walking 0.5 mile every other day with no issues. Weight up 8 lbs from last visit. Discontinued torsemide as instructed. No LEE, PND or orthopnea. Believes weight gain is caloric, has noticed increased appetite recently. Taking all medications as prescribed and tolerating all medications.    HF Medications: Carvedilol 12.5 mg BID Entresto 49/51 mg BID Spironolactone 12.5 mg daily Jardiance 10 mg daily Hydralazine 50 mg TID Imdur 30 mg daily Torsemide 20 mg PRN  Has the patient been experiencing any side effects to the medications prescribed?  no  Does the patient have any problems obtaining medications due to transportation or finances?   No - [employer]  Understanding of regimen: fair Understanding of indications: fair Potential of compliance: fair Patient understands to avoid NSAIDs. Patient understands to avoid decongestants.    Pertinent Lab Values:  03/18/22: Serum creatinine 1.96, BUN 20, Potassium 4.4, Sodium 141  Vital Signs:  Weight: 188 lbs (last clinic weight: 196 lbs) Blood pressure: 154/94 Heart rate: 56   Assessment/Plan: 1. HTN: Long history of poor control.  Renal artery dopplers in 11/2021 with mild renal  artery stenosis.   - BP elevated today, increase Entresto.  2. 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 Echo (05/2021) w/ severe biventricular dysfunction, LVEF 20-25%, Mod LVH, GIIIDD (restrictive), RV severely reduced, diffuse HK. Underwent R/LHC (07/2021) showing significant CAD but no good targets for CABG.   Echo in 10/2021 showed EF persistently low at 25-30%. Echo 02/2022 showed EF 35% with diffuse hypokinesis and akinesis of the mid inferolateral and mid anterolateral walls, normal RV.  - NYHA class I-II. Appears euvolemic on exam, although weight is up 8 lbs from last visit.  - Continue torsemide 20 mg PRN only - Continue carvedilol 12.5 mg BID  - Increase Entresto to 97/103 mg BID. Repeat BMET in 2 weeks.  - Continue spironolactone 12.5 mg daily - Continue Jardiance 10 mg daily.  - Continue hydralazine 50 mg TID (cannot tolerate higher dose).  - Continue Imdur 30 mg daily.    - Echo is borderline for ICD (narrow QRS, not CRT candidate).  Needs cardiac MRI to more closely assess LV EF. Scheduled for 05/2022.  3. CAD: LHC (07/2021) with diffuse disease occluded large ramus and moderate D1. 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 No good PCI options in setting of diffuse disease and no chest pain, and he does not have good targets for CABG (would be high risk CABG with low EF).  Medically manage. Still denies chest pain.  - Continue ASA + statin. - LDL has been high, pharmacy clinic working on Repatha for him.  4. CKD, Stage III: BMET today.  5. Hyperlipidemia: LDL still high on atorvastatin 80 mg daily.  - Continue atorvastatin 80 mg daily.  - Working on getting Repatha through lipid clinic.  -Repeat lipids next clinic visit.  Follow up 5 weeks with [facility].    Karle Plumber, PharmD, BCPS, BCCP, CPP Heart Failure Clinic Pharmacist [phone]

## 2022-03-18 ENCOUNTER — Ambulatory Visit (HOSPITAL_COMMUNITY)
Admission: RE | Admit: 2022-03-18 | Discharge: 2022-03-18 | Disposition: A | Discharge status: Home / Self Care | Payer: Medicaid Other | Source: Ambulatory Visit | Attending: Cardiology | Admitting: Cardiology

## 2022-03-18 DIAGNOSIS — N1832 Chronic kidney disease, stage 3b: Secondary | ICD-10-CM | POA: Diagnosis not present

## 2022-03-18 DIAGNOSIS — I5041 Acute combined systolic (congestive) and diastolic (congestive) heart failure: Secondary | ICD-10-CM

## 2022-03-18 DIAGNOSIS — I5022 Chronic systolic (congestive) heart failure: Secondary | ICD-10-CM

## 2022-03-18 LAB — BASIC METABOLIC PANEL
Anion gap: 5 (ref 5–15)
BUN: 20 mg/dL (ref 8–23)
CO2: 24 mmol/L (ref 22–32)
Calcium: 9 mg/dL (ref 8.9–10.3)
Chloride: 112 mmol/L — ABNORMAL HIGH (ref 98–111)
Creatinine, Ser: 1.96 mg/dL — ABNORMAL HIGH (ref 0.61–1.24)
GFR, Estimated: 38 mL/min — ABNORMAL LOW (ref >60)
Glucose, Bld: 169 mg/dL — ABNORMAL HIGH (ref 70–99)
Potassium: 4.4 mmol/L (ref 3.5–5.1)
Sodium: 141 mmol/L (ref 135–145)

## 2022-03-18 LAB — BRAIN NATRIURETIC PEPTIDE: B Natriuretic Peptide: 66.6 pg/mL (ref 0.0–100.0)

## 2022-03-25 ENCOUNTER — Ambulatory Visit (HOSPITAL_COMMUNITY)
Admission: RE | Admit: 2022-03-25 | Discharge: 2022-03-25 | Disposition: A | Discharge status: Home / Self Care | Payer: Medicaid Other | Source: Ambulatory Visit | Attending: Cardiology | Admitting: Cardiology

## 2022-03-25 ENCOUNTER — Other Ambulatory Visit (HOSPITAL_COMMUNITY): Payer: Self-pay | Admitting: Pharmacist

## 2022-03-25 ENCOUNTER — Other Ambulatory Visit (HOSPITAL_BASED_OUTPATIENT_CLINIC_OR_DEPARTMENT_OTHER): Payer: Self-pay

## 2022-03-25 ENCOUNTER — Telehealth (HOSPITAL_COMMUNITY): Payer: Self-pay | Admitting: Pharmacist

## 2022-03-25 VITALS — BP 154/94 | HR 56 | Wt 196.0 lb

## 2022-03-25 DIAGNOSIS — N183 Chronic kidney disease, stage 3 unspecified: Secondary | ICD-10-CM | POA: Diagnosis not present

## 2022-03-25 DIAGNOSIS — Z79899 Other long term (current) drug therapy: Secondary | ICD-10-CM | POA: Diagnosis not present

## 2022-03-25 DIAGNOSIS — I5022 Chronic systolic (congestive) heart failure: Secondary | ICD-10-CM | POA: Diagnosis not present

## 2022-03-25 DIAGNOSIS — E1122 Type 2 diabetes mellitus with diabetic chronic kidney disease: Secondary | ICD-10-CM | POA: Insufficient documentation

## 2022-03-25 DIAGNOSIS — Z7982 Long term (current) use of aspirin: Secondary | ICD-10-CM | POA: Insufficient documentation

## 2022-03-25 DIAGNOSIS — E785 Hyperlipidemia, unspecified: Secondary | ICD-10-CM | POA: Insufficient documentation

## 2022-03-25 DIAGNOSIS — Z87891 Personal history of nicotine dependence: Secondary | ICD-10-CM | POA: Insufficient documentation

## 2022-03-25 DIAGNOSIS — I2510 Atherosclerotic heart disease of native coronary artery without angina pectoris: Secondary | ICD-10-CM | POA: Insufficient documentation

## 2022-03-25 DIAGNOSIS — I130 Hypertensive heart and chronic kidney disease with heart failure and stage 1 through stage 4 chronic kidney disease, or unspecified chronic kidney disease: Secondary | ICD-10-CM | POA: Insufficient documentation

## 2022-03-25 DIAGNOSIS — Z7901 Long term (current) use of anticoagulants: Secondary | ICD-10-CM | POA: Insufficient documentation

## 2022-03-25 MED ORDER — SACUBITRIL-VALSARTAN 97-103 MG PO TABS
1.0000 | ORAL_TABLET | Freq: Two times a day (BID) | ORAL | 3 refills | Status: DC
Start: 2022-03-25 — End: 2023-01-13

## 2022-03-25 MED ORDER — SACUBITRIL-VALSARTAN 97-103 MG PO TABS
1.0000 | ORAL_TABLET | Freq: Two times a day (BID) | ORAL | 11 refills | Status: DC
  Filled 2022-03-25: qty 60, 30d supply, fill #0

## 2022-03-25 NOTE — Telephone Encounter (Signed)
Advanced Heart Failure Patient Advocate Encounter  Prior Authorization for Sherryll Burger has been approved.     Effective dates: 03/11/22 through 03/25/23  Karle Plumber, PharmD, BCPS, BCCP, CPP Heart Failure Clinic Pharmacist [phone]

## 2022-03-25 NOTE — Telephone Encounter (Signed)
Patient Advocate Encounter   Received notification from [facility] that prior authorization for Sherryll Burger is required.   PA submitted on CoverMyMeds Key [id number] Status is pending   Will continue to follow.   Karle Plumber, PharmD, BCPS, BCCP, CPP Heart Failure Clinic Pharmacist [phone]

## 2022-03-25 NOTE — Patient Instructions (Signed)
It was a pleasure seeing you today!  MEDICATIONS: -We are changing your medications today --Increase Entresto to 97/103 mg (1 tablet) twice daily.  -Call if you have questions about your medications.   NEXT APPOINTMENT: Return to clinic in 5 weeks with [facility].  In general, to take care of your heart failure: -Limit your fluid intake to 2 Liters (half-gallon) per day.   -Limit your salt intake to ideally 2-3 grams (2000-3000 mg) per day. -Weigh yourself daily and record, and bring that "weight diary" to your next appointment.  (Weight gain of 2-3 pounds in 1 day typically means fluid weight.) -The medications for your heart are to help your heart and help you live longer.   -Please contact us before stopping any of your heart medications.  Call the clinic at [phone] with questions or to reschedule future appointments.

## 2022-04-01 ENCOUNTER — Other Ambulatory Visit (HOSPITAL_BASED_OUTPATIENT_CLINIC_OR_DEPARTMENT_OTHER): Payer: Self-pay

## 2022-04-02 ENCOUNTER — Telehealth (HOSPITAL_COMMUNITY): Payer: Self-pay | Admitting: Pharmacy Technician

## 2022-04-02 ENCOUNTER — Other Ambulatory Visit (HOSPITAL_BASED_OUTPATIENT_CLINIC_OR_DEPARTMENT_OTHER): Payer: Self-pay

## 2022-04-02 MED ORDER — ACCU-CHEK GUIDE W/DEVICE KIT
PACK | 0 refills | Status: AC
  Filled 2022-04-02 – 2022-04-30 (×2): qty 1, 30d supply, fill #0

## 2022-04-02 NOTE — Telephone Encounter (Signed)
Patient Advocate Encounter   Received notification from [facility] that prior authorization for Jardiance is required.   PA submitted on CoverMyMeds Key [id number] Status is pending   Will continue to follow.

## 2022-04-03 ENCOUNTER — Other Ambulatory Visit (HOSPITAL_BASED_OUTPATIENT_CLINIC_OR_DEPARTMENT_OTHER): Payer: Self-pay

## 2022-04-03 NOTE — Telephone Encounter (Signed)
Advanced Heart Failure Patient Advocate Encounter  Prior Authorization for London Pepper has been approved.    PA# [id number] Effective dates: 03/19/22 through 04/02/23  Archer Asa, CPhT

## 2022-04-08 ENCOUNTER — Ambulatory Visit (HOSPITAL_COMMUNITY)
Admission: RE | Admit: 2022-04-08 | Discharge: 2022-04-08 | Disposition: A | Discharge status: Home / Self Care | Payer: Medicaid Other | Source: Ambulatory Visit | Attending: Cardiology | Admitting: Cardiology

## 2022-04-08 DIAGNOSIS — I5022 Chronic systolic (congestive) heart failure: Secondary | ICD-10-CM | POA: Diagnosis present

## 2022-04-08 LAB — BASIC METABOLIC PANEL
Anion gap: 5 (ref 5–15)
BUN: 20 mg/dL (ref 8–23)
CO2: 26 mmol/L (ref 22–32)
Calcium: 9.1 mg/dL (ref 8.9–10.3)
Chloride: 110 mmol/L (ref 98–111)
Creatinine, Ser: 2.19 mg/dL — ABNORMAL HIGH (ref 0.61–1.24)
GFR, Estimated: 33 mL/min — ABNORMAL LOW (ref >60)
Glucose, Bld: 216 mg/dL — ABNORMAL HIGH (ref 70–99)
Potassium: 5 mmol/L (ref 3.5–5.1)
Sodium: 141 mmol/L (ref 135–145)

## 2022-04-16 ENCOUNTER — Other Ambulatory Visit (HOSPITAL_BASED_OUTPATIENT_CLINIC_OR_DEPARTMENT_OTHER): Payer: Self-pay

## 2022-04-22 ENCOUNTER — Other Ambulatory Visit (HOSPITAL_COMMUNITY): Payer: Self-pay

## 2022-04-29 ENCOUNTER — Other Ambulatory Visit (HOSPITAL_BASED_OUTPATIENT_CLINIC_OR_DEPARTMENT_OTHER): Payer: Self-pay

## 2022-04-29 ENCOUNTER — Encounter (HOSPITAL_COMMUNITY): Payer: Self-pay

## 2022-04-29 ENCOUNTER — Ambulatory Visit (HOSPITAL_COMMUNITY)
Admission: EM | Admit: 2022-04-29 | Discharge: 2022-04-29 | Disposition: A | Discharge status: Home / Self Care | Payer: Medicaid Other | Attending: Physician Assistant | Admitting: Physician Assistant

## 2022-04-29 DIAGNOSIS — L255 Unspecified contact dermatitis due to plants, except food: Secondary | ICD-10-CM | POA: Diagnosis not present

## 2022-04-29 LAB — CBG MONITORING, ED: Glucose-Capillary: 196 mg/dL — ABNORMAL HIGH (ref 70–99)

## 2022-04-29 MED ORDER — TRIAMCINOLONE ACETONIDE 0.1 % EX CREA
1.0000 | TOPICAL_CREAM | Freq: Two times a day (BID) | CUTANEOUS | 0 refills | Status: DC
  Filled 2022-04-29: qty 30, 15d supply, fill #0

## 2022-04-29 MED ORDER — PREDNISONE 20 MG PO TABS
40.0000 mg | ORAL_TABLET | Freq: Every day | ORAL | 0 refills | Status: AC
  Filled 2022-04-29: qty 6, 3d supply, fill #0

## 2022-04-29 NOTE — ED Provider Notes (Signed)
Rodney Schultz    CSN: [id number] Arrival date & time: 04/29/22  1505      History   Chief Complaint Chief Complaint  Patient presents with   Rash    HPI Rodney Schultz is a 61 y.o. male.   Patient presents today with a 3-day history of pruritic rash involving his forearms and part of his right face including his right upper eyelid.  Reports that he was working in the yard and believes he was exposed to poison ivy.  He denies any additional symptoms including shortness of breath, throat swelling, chest pain, nausea, vomiting.  Denies any spread of rash.  He has not tried any over-the-counter medication for symptom management.  Denies any additional exposures including to new foods, medications, insects, plants.  He does have a history of type 2 diabetes.  His last A1c was 15% in May 2023 but reports that this is better now.  He is taking his medication as prescribed.  Reports that he last ate at approximately noon and had several tacos.    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 Authorizing Provider  predniSONE (DELTASONE) 20 MG tablet Take 2 tablets (40 mg total) by mouth daily for 3 days. 04/29/22 05/02/22 Yes [place] K, PA-C  triamcinolone cream (KENALOG) 0.1 % Apply 1 Application topically 2 (two) times daily. 04/29/22  Yes Lillyanne Bradburn, Derry Skill, PA-C  aspirin (ASPIRIN CHILDRENS) 81 MG chewable tablet Chew 1 tablet (81 mg total) by mouth daily. 12/02/21 12/02/22  Larey Dresser, MD  atorvastatin (LIPITOR) 80 MG tablet Take 1 tablet (80 mg total) by mouth daily. 02/12/22   Larey Dresser, MD  Blood Glucose Monitoring Suppl (ACCU-CHEK GUIDE) w/Device KIT Use as directed twice daily. 04/02/22     carvedilol (COREG) 12.5 MG tablet Take 1 tablet (12.5 mg total) by mouth 2 (two) times daily with a meal. 02/12/22   Larey Dresser, MD  empagliflozin (JARDIANCE) 10 MG TABS tablet Take 1 tablet (10 mg total) by mouth daily before breakfast. 02/12/22   Larey Dresser, MD  glipiZIDE (GLUCOTROL) 10 MG tablet Take 1 tablet (10 mg total) by mouth daily before breakfast. 03/27/21   Tysinger, Camelia Eng, PA-C  glucose blood test strip Use twice daily to check blood sugars 12/17/21   Larey Dresser, MD  hydrALAZINE (APRESOLINE) 50 MG tablet Take 1 tablet (50 mg total) by mouth 3 (three) times daily. 02/12/22  Larey Dresser, MD  insulin glargine, 1 Unit Dial, (TOUJEO SOLOSTAR) 300 UNIT/ML Solostar Pen Inject 15 Units into the skin daily. 12/17/21   Larey Dresser, MD  Insulin Pen Needle (UNIFINE PENTIPS) 32G X 4 MM MISC Use daily with insulin 12/17/21   Larey Dresser, MD  isosorbide mononitrate (IMDUR) 30 MG 24 hr tablet Take 1 tablet (30 mg total) by mouth daily. 02/12/22   Larey Dresser, MD  Lancets Micro Thin 33G MISC Use to check blood sugar twice daily 12/17/21   Larey Dresser, MD  metFORMIN (GLUCOPHAGE-XR) 500 MG 24 hr tablet Take 1 tablet (500 mg total) by mouth 2 (two) times daily. 08/13/21 08/13/22  Larey Dresser, MD  sacubitril-valsartan (ENTRESTO) 97-103 MG Take 1 tablet by mouth 2 (two) times daily. 03/25/22   Larey Dresser, MD  spironolactone (ALDACTONE) 25 MG tablet Take 0.5 tablets (12.5 mg total) by mouth daily. 03/04/22   Larey Dresser, MD  loratadine (CLARITIN) 10 MG tablet Take 10 mg by mouth daily as needed for allergies.  09/13/20  [provider]    Family History Family History  Problem Relation Age of Onset   Healthy Mother     Social History Social History   Tobacco Use   Smoking status: Every Day    Packs/day: 0.10    Years: 35.00    Total pack years: 3.50    Types: Cigarettes    Start date: 09/02/2021   Smokeless tobacco: Never   Tobacco comments:    Last use 06/17/21. Pt smoked a pack a week  Vaping Use   Vaping Use: Never used  Substance Use Topics   Alcohol use: Not Currently   Drug use: Never     Allergies   Influenza vaccines   Review of Systems Review of Systems  Constitutional:  Positive for activity change. Negative for appetite change, fatigue and fever.  HENT:  Negative for sore throat, trouble swallowing and voice change.   Gastrointestinal:  Negative for abdominal pain, diarrhea, nausea and vomiting.  Musculoskeletal:  Negative for arthralgias and myalgias.  Skin:  Positive for rash.  Neurological:  Negative for dizziness, light-headedness and headaches.     Physical Exam Triage Vital Signs ED Triage Vitals [04/29/22 1548]  Enc Vitals Group     BP (!) 148/92     Pulse Rate 96     Resp 16     Temp 98.1 F (36.7 C)     Temp Source Oral     SpO2 98 %     Weight      Height      Head Circumference      Peak Flow      Pain Score      Pain Loc      Pain Edu?      Excl. in [place]?    No data found.  Updated Vital Signs BP (!) 148/92 (BP Location: Left Arm)   Pulse 96   Temp 98.1 F (36.7 C) (Oral)   Resp 16   SpO2 98%   Visual Acuity Right Eye Distance:   Left Eye Distance:   Bilateral Distance:    Right  Eye Near:   Left Eye Near:    Bilateral Near:     Physical Exam Vitals reviewed.  Constitutional:      General: He is awake.     Appearance: Normal appearance. He is well-developed. He is not ill-appearing.     Comments: Very  pleasant male appears stated age in no acute distress sitting comfortably in exam room  HENT:     Head: Normocephalic and atraumatic.     Mouth/Throat:     Pharynx: Uvula midline. No oropharyngeal exudate or posterior oropharyngeal erythema.  Eyes:     Extraocular Movements: Extraocular movements intact.     Conjunctiva/sclera: Conjunctivae normal.     Pupils: Pupils are equal, round, and reactive to light.     Comments: Swelling noted right upper eyelid without rash.  Cardiovascular:     Rate and Rhythm: Normal rate and regular rhythm.     Heart sounds: Normal heart sounds, S1 normal and S2 normal. No murmur heard. Pulmonary:     Effort: Pulmonary effort is normal.     Breath sounds: Normal breath sounds. No stridor. No wheezing, rhonchi or rales.     Comments: Clear to auscultation bilaterally Skin:    Findings: Rash present. Rash is macular, papular and vesicular.     Comments: Maculopapular rash with some vesicles noted bilateral forearms.  Neurological:     Mental Status: He is alert.  Psychiatric:        Behavior: Behavior is cooperative.      UC Treatments / Results  Labs (all labs ordered are listed, but only abnormal results are displayed) Labs Reviewed  CBG MONITORING, ED - Abnormal; Notable for the following components:      Result Value   Glucose-Capillary 196 (*)    All other components within normal limits    EKG   Radiology No results found.  Procedures Procedures (including critical care time)  Medications Ordered in UC Medications - No data to display  Initial Impression / Assessment and Plan / UC Course  I have reviewed the triage vital signs and the nursing notes.  Pertinent labs & imaging results that were  available during my care of the patient were reviewed by me and considered in my medical decision making (see chart for details).     Given involvement of eyelid will try a short course of systemic steroids.  We discussed that this can cause hyperglycemia and he is to drink plenty of fluid and avoid carbohydrates.  Recommended that he monitor his blood sugar closely and follow-up with his primary care.  We will do 40 mg for 3 days and he was instructed not to take NSAIDs.  Recommended to keep area clean and apply triamcinolone to forearms but should not apply this to his face/eyelid.  He can use antihistamines for relief of pruritus.  Discussed that if he has any worsening symptoms including spread of rash, swelling of his throat, muffled voice, shortness of breath, dysphagia, significant hyperglycemia he needs to go to the emergency room.  Discussed case with Dr. Windy Carina who agreed with treatment plan.  Strict return precautions given.  Final Clinical Impressions(s) / UC Diagnoses   Final diagnoses:  Rhus dermatitis     Discharge Instructions      Your blood sugar is high but I do think you still need the steroids to help manage your poison ivy.  Take 40 mg for 3 days.  Do not take NSAIDs with this medication including aspirin, ibuprofen/Advil, naproxen/Aleve.  Use over-the-counter antihistamine if needed for itching.  Apply triamcinolone cream to specific areas on your arms that are bothering you.  Do not apply this to your face or around your eye.  Because prednisone will increase her blood sugar is important that you avoid carbohydrates (sugar, starch, bread, pasta, potatoes)  and drink lots of fluid.  Monitor your blood sugar closely.  Follow-up with your primary care soon as possible.     ED Prescriptions     Medication Sig Dispense Auth. Provider   predniSONE (DELTASONE) 20 MG tablet Take 2 tablets (40 mg total) by mouth daily for 3 days. 6 tablet Kerith Sherley K, PA-C   triamcinolone  cream (KENALOG) 0.1 % Apply 1 Application topically 2 (two) times daily. 30 g Harvel Meskill, Derry Skill, PA-C      PDMP not reviewed this encounter.   Terrilee Croak, PA-C 04/29/22 1608

## 2022-04-29 NOTE — ED Triage Notes (Signed)
Pt reports rash all over his body and under his right eye lid.

## 2022-04-29 NOTE — Discharge Instructions (Signed)
Your blood sugar is high but I do think you still need the steroids to help manage your poison ivy.  Take 40 mg for 3 days.  Do not take NSAIDs with this medication including aspirin, ibuprofen/Advil, naproxen/Aleve.  Use over-the-counter antihistamine if needed for itching.  Apply triamcinolone cream to specific areas on your arms that are bothering you.  Do not apply this to your face or around your eye.  Because prednisone will increase her blood sugar is important that you avoid carbohydrates (sugar, starch, bread, pasta, potatoes) and drink lots of fluid.  Monitor your blood sugar closely.  Follow-up with your primary care soon as possible.

## 2022-04-30 ENCOUNTER — Other Ambulatory Visit (HOSPITAL_BASED_OUTPATIENT_CLINIC_OR_DEPARTMENT_OTHER): Payer: Self-pay

## 2022-05-04 ENCOUNTER — Ambulatory Visit (HOSPITAL_COMMUNITY)
Admission: RE | Admit: 2022-05-04 | Discharge: 2022-05-04 | Disposition: A | Discharge status: Home / Self Care | Payer: Medicaid Other | Source: Ambulatory Visit | Attending: Family Medicine | Admitting: Family Medicine

## 2022-05-04 ENCOUNTER — Telehealth (HOSPITAL_COMMUNITY): Payer: Self-pay

## 2022-05-04 ENCOUNTER — Other Ambulatory Visit (HOSPITAL_BASED_OUTPATIENT_CLINIC_OR_DEPARTMENT_OTHER): Payer: Self-pay

## 2022-05-04 ENCOUNTER — Encounter (HOSPITAL_COMMUNITY): Payer: Self-pay

## 2022-05-04 ENCOUNTER — Other Ambulatory Visit (HOSPITAL_COMMUNITY): Payer: Self-pay

## 2022-05-04 VITALS — BP 140/94 | HR 61 | Ht 70.0 in | Wt 203.6 lb

## 2022-05-04 DIAGNOSIS — I2510 Atherosclerotic heart disease of native coronary artery without angina pectoris: Secondary | ICD-10-CM | POA: Insufficient documentation

## 2022-05-04 DIAGNOSIS — Z7984 Long term (current) use of oral hypoglycemic drugs: Secondary | ICD-10-CM | POA: Diagnosis not present

## 2022-05-04 DIAGNOSIS — N183 Chronic kidney disease, stage 3 unspecified: Secondary | ICD-10-CM | POA: Insufficient documentation

## 2022-05-04 DIAGNOSIS — N1832 Chronic kidney disease, stage 3b: Secondary | ICD-10-CM

## 2022-05-04 DIAGNOSIS — E785 Hyperlipidemia, unspecified: Secondary | ICD-10-CM

## 2022-05-04 DIAGNOSIS — E1122 Type 2 diabetes mellitus with diabetic chronic kidney disease: Secondary | ICD-10-CM | POA: Insufficient documentation

## 2022-05-04 DIAGNOSIS — I130 Hypertensive heart and chronic kidney disease with heart failure and stage 1 through stage 4 chronic kidney disease, or unspecified chronic kidney disease: Secondary | ICD-10-CM | POA: Diagnosis present

## 2022-05-04 DIAGNOSIS — Z79899 Other long term (current) drug therapy: Secondary | ICD-10-CM | POA: Diagnosis not present

## 2022-05-04 DIAGNOSIS — I10 Essential (primary) hypertension: Secondary | ICD-10-CM

## 2022-05-04 DIAGNOSIS — I701 Atherosclerosis of renal artery: Secondary | ICD-10-CM | POA: Insufficient documentation

## 2022-05-04 DIAGNOSIS — I5022 Chronic systolic (congestive) heart failure: Secondary | ICD-10-CM | POA: Insufficient documentation

## 2022-05-04 LAB — BASIC METABOLIC PANEL
Anion gap: 5 (ref 5–15)
BUN: 29 mg/dL — ABNORMAL HIGH (ref 8–23)
CO2: 22 mmol/L (ref 22–32)
Calcium: 8.6 mg/dL — ABNORMAL LOW (ref 8.9–10.3)
Chloride: 111 mmol/L (ref 98–111)
Creatinine, Ser: 2.16 mg/dL — ABNORMAL HIGH (ref 0.61–1.24)
GFR, Estimated: 34 mL/min — ABNORMAL LOW (ref >60)
Glucose, Bld: 157 mg/dL — ABNORMAL HIGH (ref 70–99)
Potassium: 4 mmol/L (ref 3.5–5.1)
Sodium: 138 mmol/L (ref 135–145)

## 2022-05-04 LAB — LIPID PANEL
Cholesterol: 191 mg/dL (ref 0–200)
HDL: 28 mg/dL — ABNORMAL LOW (ref >40)
LDL Cholesterol: 107 mg/dL — ABNORMAL HIGH (ref 0–99)
Total CHOL/HDL Ratio: 6.8 RATIO
Triglycerides: 280 mg/dL — ABNORMAL HIGH (ref <150)
VLDL: 56 mg/dL — ABNORMAL HIGH (ref 0–40)

## 2022-05-04 LAB — BRAIN NATRIURETIC PEPTIDE: B Natriuretic Peptide: 123.6 pg/mL — ABNORMAL HIGH (ref 0.0–100.0)

## 2022-05-04 MED ORDER — ICOSAPENT ETHYL 1 G PO CAPS
2.0000 g | ORAL_CAPSULE | Freq: Two times a day (BID) | ORAL | 6 refills | Status: DC
  Filled 2022-05-04: qty 120, 30d supply, fill #0

## 2022-05-04 NOTE — Patient Instructions (Signed)
Thank you for coming in today  Labs were done today, if any labs are abnormal the clinic will call you No news is good news   Your physician recommends that you schedule a follow-up appointment in:  3 months withg Dr. Aundra Dubin  START Vascepa 2g twice daily     Do the following things EVERYDAY: Weigh yourself in the morning before breakfast. Write it down and keep it in a log. Take your medicines as prescribed Eat low salt foods--Limit salt (sodium) to 2000 mg per day.  Stay as active as you can everyday Limit all fluids for the day to less than 2 liters  At the [facility], you and your health needs are our priority. As part of our continuing mission to provide you with exceptional heart care, we have created designated Provider Care Teams. These Care Teams include your primary Cardiologist (physician) and Advanced Practice Providers (APPs- Physician Assistants and Nurse Practitioners) who all work together to provide you with the care you need, when you need it.   You may see any of the following providers on your designated Care Team at your next follow up: Dr Glori Bickers Dr Loralie Champagne Dr. Roxana Hires, NP Lyda Jester, [place] [facility] [place], [place] Forestine Na, NP Audry Riles, PharmD   Please be sure to bring in all your medications bottles to every appointment.   If you have any questions or concerns before your next appointment please send [place] a message through [place] or call our office at [phone].    TO LEAVE A MESSAGE FOR THE NURSE SELECT OPTION 2, PLEASE LEAVE A MESSAGE INCLUDING: YOUR NAME DATE OF BIRTH CALL BACK NUMBER REASON FOR CALL**this is important as we prioritize the call backs  YOU WILL RECEIVE A CALL BACK THE SAME DAY AS LONG AS YOU CALL BEFORE 4:00 PM

## 2022-05-04 NOTE — Progress Notes (Signed)
ADVANCED HF CLINIC NOTE   Primary Care: Pcp, No Primary Cardiologist: Dr. Irish Lack HF Cardiologist: Dr. Aundra Dubin  HPI: Rodney Schultz is a 61 y.o. AAM w/ h/o HTN, T2DM and HLD, admitted to [facility] w/ new systolic heart failure in 11/22.    He reports long history of poorly controlled HTN in the past. Baseline SBPs would average in the 170s. Denies ETOH use. Former smoker but has quit. No known family h/o CHF. No h/o ischemic-like CP. No known h/o COVID but reports having the flu prior to his 11/22 admission.     Admitted 11/22 w/ new CHF, hypertensive + AKI w/ SCr in the 2.0 range (previous baseline ~1.5). Echo w/ severe biventricular dysfunction, LVEF 20-25% with diffuse hypokinesis, mod LVH, GIIIDD (restrictive), RV severely reduced, moderate MR. R/LHC was deferred initially due to renal insuffiency. He was diuresed w/ IV Lasix. GDMT w/ hydralazine, Imdur and Coreg initiated. Discharge wt 177 lb. Referred to TOC.    Seen 1/23 in HF clinic to establish care for his CHF. R/LHC showed mildly elevated PCWP, pulmonary venous hypertension, preserved CO and diffuse CAD w/ occluded large ramus and moderate D1. 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 No good PCI options and he does not have good targets for CABG (would be high risk CABG with low EF).  Medically managed in setting of no anginal chest pain.   Echo in 4/23 showed EF 25-30% with moderate LVH and normal RV, mild MR. Echo 8/23 showed EF 35% with diffuse hypokinesis and akinesis of the mid inferolateral and mid anterolateral walls, normal RV.   Today he returns for HF follow up. Overall feeling fine. Walks 4 days a week for an hour at each time for exercise. He is able to push mow his yard without dyspnea. Denies palpitations, CP, dizziness, edema, or PND/Orthopnea. Appetite ok. No fever or chills. Weight at home 190 pounds. Taking all medications. Blood sugars 120-125.  ECG (personally reviewed): none ordered  today.  Labs (1/23): K 4.3, creatinine 1.95 Labs (2/23): K 4.4, creatinine 1.75 Labs (4/23): K 4.4, creatinine 2.27, BNP 45, LDL 123, TGs 409 Labs (6/23): K 5.0,  creatinine 1.7 Labs (9/23): K 5.0, creatinine 2.19  PMH: 1. Type 2 diabetes 2. HTN: Poorly controlled.  - Renal artery dopplers: Mild renal artery stenosis.   3. GERD 4. CKD stage 3 5. Hyperlipidemia 6. Former smoker 7. CAD: LHC (1/23) with occluded large ramu, occluded moderate D2, 90% dLAD, 80% dLCx, diffuse RCA disease reaching 80% in the mid vessel and 80% in the distal vessel.  No good targets for CABG, and reviewed by interventional cardiology who thought no good interventional targets given absence of anginal pain.  8. 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  - Echo (11/22): EF 20-25%, severe LV dysfunction with global HK, moderate LVH, grade III DD (restrictive), RV severely reduced, moderate MR  - RHC (1/23): mean RA 8, PA 54/17 mean 32, mean PCWP 18, CI 2.54, PVR 2.8 WU (pulmonary venous hypertension).  - Echo (4/23): EF 25-30% with moderate LVH and normal RV, mild MR. - Echo (8/23): EF 35% with diffuse hypokinesis and akinesis of the mid inferolateral and mid anterolateral walls, normal RV.  9. OSA: Mild on 2/23 sleep study.  10. Left renal mass: Likely cyst by renal US.   Current Outpatient Medications  Medication Sig Dispense Refill   aspirin (ASPIRIN CHILDRENS) 81 MG chewable tablet Chew 1 tablet (81 mg total) by mouth daily. [place]  tablet 11   atorvastatin (LIPITOR) 80 MG tablet Take 1 tablet (80 mg total) by mouth daily. 30 tablet 3   Blood Glucose Monitoring Suppl (ACCU-CHEK GUIDE) w/Device KIT Use as directed twice daily. 1 kit 0   carvedilol (COREG) 12.5 MG tablet Take 1 tablet (12.5 mg total) by mouth 2 (two) times daily with a meal. 180 tablet 3   empagliflozin (JARDIANCE) 10 MG TABS tablet Take 1 tablet (10 mg total) by mouth daily before breakfast. 30 tablet 3    glipiZIDE (GLUCOTROL) 10 MG tablet Take 1 tablet (10 mg total) by mouth daily before breakfast. 90 tablet 1   glucose blood test strip Use twice daily to check blood sugars 100 each 12   hydrALAZINE (APRESOLINE) 50 MG tablet Take 1 tablet (50 mg total) by mouth 3 (three) times daily. 90 tablet 3   insulin glargine, 1 Unit Dial, (TOUJEO SOLOSTAR) 300 UNIT/ML Solostar Pen Inject 15 Units into the skin daily. 4.5 mL 3   Insulin Pen Needle (UNIFINE PENTIPS) 32G X 4 MM MISC Use daily with insulin 100 each 2   isosorbide mononitrate (IMDUR) 30 MG 24 hr tablet Take 1 tablet (30 mg total) by mouth daily. 30 tablet 3   Lancets Micro Thin 33G MISC Use to check blood sugar twice daily 100 each 1   metFORMIN (GLUCOPHAGE-XR) 500 MG 24 hr tablet Take 1 tablet (500 mg total) by mouth 2 (two) times daily. 180 tablet 1   sacubitril-valsartan (ENTRESTO) 97-103 MG Take 1 tablet by mouth 2 (two) times daily. 180 tablet 3   spironolactone (ALDACTONE) 25 MG tablet Take 0.5 tablets (12.5 mg total) by mouth daily. 45 tablet 3   triamcinolone cream (KENALOG) 0.1 % Apply 1 Application topically 2 (two) times daily. 30 g 0   No current facility-administered medications for this encounter.   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 Pt smoked a pack a week  Vaping Use   Vaping Use: Never used  Substance and Sexual Activity   Alcohol use: Not Currently   Drug  use: Never   Sexual activity: Not on file  Other Topics Concern   Not on file  Social History Narrative   Not on file   Social Determinants of Health   Financial Resource Strain: High Risk (02/12/2022)   Overall Financial Resource Strain (CARDIA)    Difficulty of Paying Living Expenses: Very hard  Food Insecurity: Food Insecurity Present (06/20/2021)   Hunger Vital Sign    Worried About Running Out of Food in the Last Year: Never true    Ran Out of Food in the Last Year: Sometimes true  Transportation Needs: No Transportation Needs (06/20/2021)   PRAPARE - Transportation    Lack of Transportation (Medical): No    Lack of  Transportation (Non-Medical): No  Physical Activity: Not on file  Stress: Not on file  Social Connections: Not on file  Intimate Partner Violence: Not on file   Family History  Problem Relation Age of Onset   Healthy Mother    BP (!) 140/94   Pulse 61   Ht _0  (1.778 m)   Wt 92.4 kg (203 lb 9.6 oz)   SpO2 98%   BMI 29.21 kg/m   Wt Readings from Last 3 Encounters:  05/04/22 92.4 kg (203 lb 9.6 oz)  03/25/22 88.9 kg (196 lb)  03/04/22 85.3 kg (188 lb)   PHYSICAL EXAM: General:  NAD. No resp difficulty HEENT: Normal Neck: Supple. No JVD. Carotids 2+ bilat; no bruits. No lymphadenopathy or thryomegaly appreciated. Cor: PMI nondisplaced. Regular rate & rhythm. No rubs, gallops or murmurs. Lungs: Clear Abdomen: Soft, nontender, nondistended. No hepatosplenomegaly. No bruits or masses. Good bowel sounds. Extremities: No cyanosis, clubbing, rash, edema Neuro: Alert & oriented x 3, cranial nerves grossly intact. Moves all 4 extremities w/o difficulty. Affect pleasant.  ASSESSMENT & PLAN: 1. HTN: Long history of poor control.  Renal artery dopplers in 5/23 with mild renal artery stenosis. Much-improved, but mildly elevated today, but has not had AM meds yet. 2. 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 Echo (11/22) w/  severe biventricular dysfunction, LVEF 20-25%, Mod LVH, GIIIDD (restrictive), RV severely reduced, diffuse HK. Underwent R/LHC (1/23) showing significant CAD but no good targets for CABG.  Echo in 4/23 showed EF persistently low at 25-30%. Echo 8/23 showed EF 35% with diffuse hypokinesis and akinesis of the mid inferolateral and mid anterolateral walls, normal RV.  NYHA class I, he is not volume overloaded today.  - Continue Jardiance 10 mg daily.  - Continue Coreg 12.5 mg bid.  - Continue Entresto 97/103 bid.  - Continue torsemide 20 mg qod.  - Continue hydralazine 50 mg tid (cannot tolerate higher dose).  - Continue Imdur 30 mg daily.    - Continue spironolactone 12.5 mg daily. Will not increase today with borderline K on last labs. BMET/BNP today. - Echo is borderline for ICD (narrow QRS, not CRT candidate). Cardiac MRI has been arranged for 05/29/22 to more closely assess LV EF.   3. CAD: LHC (1/23) with diffuse disease occluded large ramus and moderate D1. 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 No good PCI options in setting of diffuse disease and no chest pain, and he does not have good targets for CABG (would be high risk CABG with low EF).  Medically manage. Denies chest pain.  - Continue ASA + statin. - LDL has been high. He has been referred to Lipid Clinic. 4. CKD, Stage III: BMET today.  5. Hyperlipidemia: LDL still high on atorvastatin 80 mg daily.  - Continue atorvastatin 80 mg daily.  - Last TGs 409. Start Vascepa 2g bid ($0 co-pay) Check lipids today.   Follow up in 3 months with Dr. Aundra Dubin.  Maricela Bo [facility] FNP-BC 05/04/2022

## 2022-05-04 NOTE — Telephone Encounter (Addendum)
Pt aware, agreeable, and verbalized understanding  Referral sent   ----- Message from Rafael Bihari, FNP sent at 05/04/2022 11:41 AM EDT ----- Renal function stable.  BNP mildly elevated. Please take torsemide 20 mg daily x 3 days, then go back to 20 mg qod, per home dosing.  TGs and LDL remain elevated. Vascepa was started today. Please refer to [facility] for [place].

## 2022-05-13 ENCOUNTER — Ambulatory Visit: Payer: Medicaid Other | Attending: Cardiology | Admitting: Pharmacist Clinician (PhC)/ Clinical Pharmacy Specialist

## 2022-05-13 ENCOUNTER — Other Ambulatory Visit (HOSPITAL_BASED_OUTPATIENT_CLINIC_OR_DEPARTMENT_OTHER): Payer: Self-pay

## 2022-05-13 ENCOUNTER — Encounter: Payer: Self-pay | Admitting: Pharmacist Clinician (PhC)/ Clinical Pharmacy Specialist

## 2022-05-13 DIAGNOSIS — E785 Hyperlipidemia, unspecified: Secondary | ICD-10-CM | POA: Diagnosis not present

## 2022-05-13 DIAGNOSIS — E1165 Type 2 diabetes mellitus with hyperglycemia: Secondary | ICD-10-CM

## 2022-05-13 MED ORDER — ROSUVASTATIN CALCIUM 40 MG PO TABS
40.0000 mg | ORAL_TABLET | Freq: Every day | ORAL | 3 refills | Status: DC
  Filled 2022-05-13: qty 90, 90d supply, fill #0

## 2022-05-13 NOTE — Assessment & Plan Note (Signed)
Patient with ASCVD and LDL above goal.  Reviewed options for getting 50%+ lowering LDL cholesterol, including, PCSK-9 inhibitors, bempedoic acid and inclisiran.  Discussed mechanisms of action, dosing, side effects and potential decreases in LDL cholesterol.  Also reviewed cost information and potential options for patient assistance.  Answered all patient questions.  Based on this information, patient would prefer to start PCSK-9 inhibitor, as it is most likely to get the LDL drop needed.  We will start paperwork to get covered by Medicaid.  Once approved he will do one injection every 14 days and repeat labs after 3 months.

## 2022-05-13 NOTE — Assessment & Plan Note (Signed)
Last A1c was at 15, repeat today.  Will need to switch his Toujeo to Lantus, for Medicaid coverage, but will wait until A1c results are in, and make any dose adjustments at the same time.

## 2022-05-13 NOTE — Patient Instructions (Addendum)
Your Results:             Your most recent labs Goal  Total Cholesterol 191 < 200  Triglycerides 280 < 150  HDL (happy/good cholesterol) 28 > 40  LDL (lousy/bad cholesterol 107 < 55   Medication changes:  We will start the process to get Repatha covered by your insurance.  Once the prior authorization is complete, I will call you to let you know and confirm pharmacy information.   You will take one injection every 14 days.    When you run out of atorvastatin 80 mg, switch to rosuvastatin 40 mg daily.  It's a smaller tablet, should be easier to swallow  Lab orders:  We want to repeat cholesterol labs after 2-3 months.  We will send you a lab order to remind you once we get closer to that time.    Get A1c (measure of diabetes control) today.    Thank you for choosing [facility]

## 2022-05-13 NOTE — Progress Notes (Signed)
05/13/2022 Rodney Schultz 10-20-1960 [id number]   HPI:  Rodney Schultz is a 61 y.o. male patient of Dr Aundra Dubin, who presents today for a lipid clinic evaluation.  See pertinent past medical history below.  He was previously seen in our clinic, however at that time his A1c was at 15 and he had no primary care or endocrine MD.  We got him started on Toujeo 15 units daily (basal insulin).  He is still unable to find a PCP who will take him.    Today he reports feeling much better.  Is able to mow his lawn ([employer]) as well as walk around the block (about 1.5 miles) most days.  Now that his home blood sugar readings are back into a safer place, we will discuss lipid lowering treatment options today.  He states no problems with his medications, although does admit the atorvastatin tablets are hard to swallow.   His Toujeo is cost prohibitive, and is not on the Medicaid formulary.  We will try to switch that out to Lantus as soon as we have an updated A1c.   He has made great strides in changing his eating habits and is doing well overall.     Past Medical History: CHF 8/23 echo - EF at 35%, on GDMT  HTN History of poorly controlled BP, mild RAS, on   CAD 1./23 cath ramus 100% stenosis, 80-90% in multiple vessels (see cath)  DM2 5/23 A1c 15 - on empagliflozin 10, glipizide 10, Toujeo, metformin 500 bid  CKD Stage 3 05/04/22 GFR  34    Insurance Carrier:  medicaid?   ID [id number]    Current Medications:    atorvastatin 80 mg qd,     Social history: quit smoking about 8 months ago  Cholesterol Goals:  LDL < 55       Family history: father had some heart disease, died at 31 from massive MI; mother still living, healthy at 67; 6 siblings deceased 2 living (equal sex), no MI - 2 pneumonia; 5 children - all grown, no health issues  Diet: eats plenty of vegetables now, meat is only chicken; quit snacking; breakfast is oatmeal, boiled egg, applesauce   Exercise:  push mow lawn, has 10  lb barbells, some walking around neighborhood - walks about 1.5 miles  Lipid Panel   10/23:  TC 191, TG 280, HDL 28, LDL 107     Component Value Date/Time   CHOL 191 05/04/2022 0927   TRIG 280 (H) 05/04/2022 0927   HDL 28 (L) 05/04/2022 0927   CHOLHDL 6.8 05/04/2022 0927   VLDL 56 (H) 05/04/2022 0927   LDLCALC 107 (H) 05/04/2022 0927   LDLDIRECT 122.9 (H) 11/03/2021 1248     Current Outpatient Medications  Medication Sig Dispense Refill   rosuvastatin (CRESTOR) 40 MG tablet Take 1 tablet (40 mg total) by mouth daily. 90 tablet 3   aspirin (ASPIRIN CHILDRENS) 81 MG chewable tablet Chew 1 tablet (81 mg total) by mouth daily. 30 tablet 11   Blood Glucose Monitoring Suppl (ACCU-CHEK GUIDE) w/Device KIT Use as directed twice daily. 1 kit 0   carvedilol (COREG) 12.5 MG tablet Take 1 tablet (12.5 mg total) by mouth 2 (two) times daily with a meal. 180 tablet 3   empagliflozin (JARDIANCE) 10 MG TABS tablet Take 1 tablet (10 mg total) by mouth daily before breakfast. 30 tablet 3   glipiZIDE (GLUCOTROL) 10 MG tablet Take 1 tablet (10 mg total) by mouth daily  before breakfast. 90 tablet 1   glucose blood test strip Use twice daily to check blood sugars 100 each 12   hydrALAZINE (APRESOLINE) 50 MG tablet Take 1 tablet (50 mg total) by mouth 3 (three) times daily. 90 tablet 3   icosapent Ethyl (VASCEPA) 1 g capsule Take 2 capsules (2 g total) by mouth 2 (two) times daily. 120 capsule 6   insulin glargine, 1 Unit Dial, (TOUJEO SOLOSTAR) 300 UNIT/ML Solostar Pen Inject 15 Units into the skin daily. 4.5 mL 3   Insulin Pen Needle (UNIFINE PENTIPS) 32G X 4 MM MISC Use daily with insulin 100 each 2   isosorbide mononitrate (IMDUR) 30 MG 24 hr tablet Take 1 tablet (30 mg total) by mouth daily. 30 tablet 3   Lancets Micro Thin 33G MISC Use to check blood sugar twice daily 100 each 1   metFORMIN (GLUCOPHAGE-XR) 500 MG 24 hr tablet Take 1 tablet (500 mg total) by mouth 2 (two) times daily. 180 tablet 1    sacubitril-valsartan (ENTRESTO) 97-103 MG Take 1 tablet by mouth 2 (two) times daily. 180 tablet 3   spironolactone (ALDACTONE) 25 MG tablet Take 0.5 tablets (12.5 mg total) by mouth daily. 45 tablet 3   triamcinolone cream (KENALOG) 0.1 % Apply 1 Application topically 2 (two) times daily. 30 g 0   No current facility-administered medications for this visit.    Allergies  Allergen Reactions   Influenza Vaccines     Sick- was put in hospital     Past Medical History:  Diagnosis Date   Diabetes mellitus without complication ([place])    GERD (gastroesophageal reflux disease)    Hypertension     There were no vitals taken for this visit.   Hyperlipidemia Patient with ASCVD and LDL above goal.  Reviewed options for getting 50%+ lowering LDL cholesterol, including, PCSK-9 inhibitors, bempedoic acid and inclisiran.  Discussed mechanisms of action, dosing, side effects and potential decreases in LDL cholesterol.  Also reviewed cost information and potential options for patient assistance.  Answered all patient questions.  Based on this information, patient would prefer to start PCSK-9 inhibitor, as it is most likely to get the LDL drop needed.  We will start paperwork to get covered by Medicaid.  Once approved he will do one injection every 14 days and repeat labs after 3 months.     Type 2 diabetes mellitus with hyperglycemia (HCC) Last A1c was at 15, repeat today.  Will need to switch his Toujeo to Lantus, for Medicaid coverage, but will wait until A1c results are in, and make any dose adjustments at the same time.     Tommy Medal PharmD CPP [facility] [address] [place], [place] [address] [phone]

## 2022-05-14 ENCOUNTER — Telehealth: Payer: Self-pay | Admitting: Pharmacist

## 2022-05-14 ENCOUNTER — Other Ambulatory Visit (HOSPITAL_BASED_OUTPATIENT_CLINIC_OR_DEPARTMENT_OTHER): Payer: Self-pay

## 2022-05-14 DIAGNOSIS — E1165 Type 2 diabetes mellitus with hyperglycemia: Secondary | ICD-10-CM

## 2022-05-14 LAB — HEMOGLOBIN A1C
Est. average glucose Bld gHb Est-mCnc: 220 mg/dL
Hgb A1c MFr Bld: 9.3 % — ABNORMAL HIGH (ref 4.8–5.6)

## 2022-05-14 MED ORDER — INSULIN GLARGINE 100 UNITS/ML SOLOSTAR PEN
15.0000 [IU] | PEN_INJECTOR | Freq: Every day | SUBCUTANEOUS | 11 refills | Status: DC
  Filled 2022-05-14: qty 12, 80d supply, fill #0
  Filled 2022-08-10: qty 12, 80d supply, fill #1

## 2022-05-14 MED ORDER — UNIFINE PENTIPS 32G X 4 MM MISC
2 refills | Status: DC
  Filled 2022-05-14 – 2022-09-08 (×2): qty 100, 90d supply, fill #0
  Filled 2022-11-17: qty 100, 90d supply, fill #1

## 2022-05-14 NOTE — Telephone Encounter (Signed)
A1c improved from >15 to 9.3. Patient lost insurance coverage and is now on Medicaid. Will switch Toujeo to Lantus.

## 2022-05-17 ENCOUNTER — Other Ambulatory Visit (HOSPITAL_BASED_OUTPATIENT_CLINIC_OR_DEPARTMENT_OTHER): Payer: Self-pay

## 2022-05-18 ENCOUNTER — Other Ambulatory Visit (HOSPITAL_BASED_OUTPATIENT_CLINIC_OR_DEPARTMENT_OTHER): Payer: Self-pay

## 2022-05-19 ENCOUNTER — Telehealth: Payer: Self-pay | Admitting: Pharmacist Clinician (PhC)/ Clinical Pharmacy Specialist

## 2022-05-19 ENCOUNTER — Other Ambulatory Visit (HOSPITAL_BASED_OUTPATIENT_CLINIC_OR_DEPARTMENT_OTHER): Payer: Self-pay

## 2022-05-19 MED ORDER — REPATHA SURECLICK 140 MG/ML ~~LOC~~ SOAJ
140.0000 mg | SUBCUTANEOUS | 3 refills | Status: DC
  Filled 2022-05-19: qty 6, 84d supply, fill #0

## 2022-05-19 NOTE — Telephone Encounter (Signed)
Repatha approved by insurance  until further notice  Key:  [id number]

## 2022-05-20 ENCOUNTER — Other Ambulatory Visit (HOSPITAL_BASED_OUTPATIENT_CLINIC_OR_DEPARTMENT_OTHER): Payer: Self-pay

## 2022-05-25 ENCOUNTER — Other Ambulatory Visit (HOSPITAL_BASED_OUTPATIENT_CLINIC_OR_DEPARTMENT_OTHER): Payer: Self-pay

## 2022-05-27 ENCOUNTER — Telehealth (HOSPITAL_COMMUNITY): Payer: Self-pay | Admitting: *Deleted

## 2022-05-27 NOTE — Telephone Encounter (Signed)
[employer] auth request faxed to [employer]

## 2022-05-29 ENCOUNTER — Ambulatory Visit (HOSPITAL_COMMUNITY): Payer: Medicaid Other

## 2022-05-29 NOTE — Telephone Encounter (Signed)
[employer] auth #[id number] exp. 07/26/22

## 2022-06-01 ENCOUNTER — Telehealth (HOSPITAL_COMMUNITY): Payer: Self-pay

## 2022-06-01 NOTE — Telephone Encounter (Signed)
Disability forms faxed to [employer] on 06/01/22 at 10.10am patient aware

## 2022-06-08 ENCOUNTER — Telehealth (HOSPITAL_COMMUNITY): Payer: Self-pay | Admitting: *Deleted

## 2022-06-08 NOTE — Telephone Encounter (Signed)
Attempted to call patient regarding upcoming cardiac MRI appointment. Left message on voicemail with name and callback number  Sylwia Cuervo RN Navigator Cardiac Imaging [facility] [phone] Office [phone] Cell  

## 2022-06-08 NOTE — Telephone Encounter (Signed)
Patient returning call regarding upcoming cardiac imaging study; pt verbalizes understanding of appt date/time, parking situation and where to check in, and verified current allergies; name and call back number provided for further questions should they arise  May Ozment RN Navigator Cardiac Imaging [place] [phone] office [phone] cell  Patient denies claustrophobia or metal. 

## 2022-06-09 ENCOUNTER — Other Ambulatory Visit (HOSPITAL_COMMUNITY): Payer: Self-pay | Admitting: Cardiology

## 2022-06-09 ENCOUNTER — Ambulatory Visit (HOSPITAL_COMMUNITY)
Admission: RE | Admit: 2022-06-09 | Discharge: 2022-06-09 | Disposition: A | Discharge status: Home / Self Care | Payer: Medicaid Other | Source: Ambulatory Visit | Attending: Cardiology | Admitting: Cardiology

## 2022-06-09 DIAGNOSIS — I5022 Chronic systolic (congestive) heart failure: Secondary | ICD-10-CM | POA: Diagnosis not present

## 2022-06-09 MED ORDER — GADOBUTROL 1 MMOL/ML IV SOLN
10.0000 mL | Freq: Once | INTRAVENOUS | Status: AC | PRN
Start: 2022-06-09 — End: 2022-06-09
  Administered 2022-06-09: 10 mL via INTRAVENOUS

## 2022-06-17 ENCOUNTER — Other Ambulatory Visit: Payer: Self-pay | Admitting: Medical

## 2022-06-20 ENCOUNTER — Other Ambulatory Visit: Payer: Self-pay | Admitting: Interventional Cardiology

## 2022-06-20 ENCOUNTER — Other Ambulatory Visit (HOSPITAL_BASED_OUTPATIENT_CLINIC_OR_DEPARTMENT_OTHER): Payer: Self-pay

## 2022-06-22 ENCOUNTER — Other Ambulatory Visit (HOSPITAL_BASED_OUTPATIENT_CLINIC_OR_DEPARTMENT_OTHER): Payer: Self-pay

## 2022-06-22 ENCOUNTER — Other Ambulatory Visit (HOSPITAL_COMMUNITY): Payer: Self-pay | Admitting: Family Medicine

## 2022-06-24 ENCOUNTER — Encounter (HOSPITAL_BASED_OUTPATIENT_CLINIC_OR_DEPARTMENT_OTHER): Payer: Self-pay | Admitting: Pharmacist

## 2022-06-24 ENCOUNTER — Other Ambulatory Visit (HOSPITAL_BASED_OUTPATIENT_CLINIC_OR_DEPARTMENT_OTHER): Payer: Self-pay

## 2022-06-24 ENCOUNTER — Other Ambulatory Visit (HOSPITAL_COMMUNITY): Payer: Self-pay | Admitting: Cardiology

## 2022-08-10 ENCOUNTER — Other Ambulatory Visit (HOSPITAL_BASED_OUTPATIENT_CLINIC_OR_DEPARTMENT_OTHER): Payer: Self-pay

## 2022-08-11 ENCOUNTER — Telehealth: Payer: Self-pay | Admitting: Pharmacist Clinician (PhC)/ Clinical Pharmacy Specialist

## 2022-08-11 NOTE — Telephone Encounter (Signed)
Asking that you give him a call regarding his meds. Please advise

## 2022-08-13 ENCOUNTER — Other Ambulatory Visit (HOSPITAL_BASED_OUTPATIENT_CLINIC_OR_DEPARTMENT_OTHER): Payer: Self-pay

## 2022-08-18 NOTE — Telephone Encounter (Signed)
Spoke with patient, he states the insulin injections are making him sick.

## 2022-08-19 ENCOUNTER — Inpatient Hospital Stay (HOSPITAL_COMMUNITY)
Admission: EM | Admit: 2022-08-19 | Discharge: 2022-08-23 | DRG: 064 | Disposition: A | Discharge status: Home / Self Care | Payer: Medicaid Other | Attending: Internal Medicine | Admitting: Internal Medicine

## 2022-08-19 ENCOUNTER — Other Ambulatory Visit: Payer: Self-pay

## 2022-08-19 ENCOUNTER — Emergency Department (HOSPITAL_COMMUNITY): Payer: Medicaid Other

## 2022-08-19 ENCOUNTER — Encounter (HOSPITAL_COMMUNITY): Payer: Self-pay

## 2022-08-19 DIAGNOSIS — E118 Type 2 diabetes mellitus with unspecified complications: Secondary | ICD-10-CM | POA: Diagnosis present

## 2022-08-19 DIAGNOSIS — Z91148 Patient's other noncompliance with medication regimen for other reason: Secondary | ICD-10-CM | POA: Diagnosis not present

## 2022-08-19 DIAGNOSIS — N1832 Chronic kidney disease, stage 3b: Secondary | ICD-10-CM | POA: Diagnosis present

## 2022-08-19 DIAGNOSIS — R6881 Early satiety: Secondary | ICD-10-CM | POA: Diagnosis present

## 2022-08-19 DIAGNOSIS — I6381 Other cerebral infarction due to occlusion or stenosis of small artery: Secondary | ICD-10-CM | POA: Diagnosis present

## 2022-08-19 DIAGNOSIS — E785 Hyperlipidemia, unspecified: Secondary | ICD-10-CM | POA: Diagnosis present

## 2022-08-19 DIAGNOSIS — R569 Unspecified convulsions: Secondary | ICD-10-CM | POA: Diagnosis present

## 2022-08-19 DIAGNOSIS — E1122 Type 2 diabetes mellitus with diabetic chronic kidney disease: Secondary | ICD-10-CM | POA: Diagnosis present

## 2022-08-19 DIAGNOSIS — E1100 Type 2 diabetes mellitus with hyperosmolarity without nonketotic hyperglycemic-hyperosmolar coma (NKHHC): Secondary | ICD-10-CM | POA: Diagnosis not present

## 2022-08-19 DIAGNOSIS — I5022 Chronic systolic (congestive) heart failure: Secondary | ICD-10-CM | POA: Diagnosis present

## 2022-08-19 DIAGNOSIS — R29700 NIHSS score 0: Secondary | ICD-10-CM | POA: Diagnosis present

## 2022-08-19 DIAGNOSIS — I130 Hypertensive heart and chronic kidney disease with heart failure and stage 1 through stage 4 chronic kidney disease, or unspecified chronic kidney disease: Secondary | ICD-10-CM | POA: Diagnosis present

## 2022-08-19 DIAGNOSIS — E1165 Type 2 diabetes mellitus with hyperglycemia: Secondary | ICD-10-CM

## 2022-08-19 DIAGNOSIS — Z79899 Other long term (current) drug therapy: Secondary | ICD-10-CM

## 2022-08-19 DIAGNOSIS — K219 Gastro-esophageal reflux disease without esophagitis: Secondary | ICD-10-CM | POA: Diagnosis present

## 2022-08-19 DIAGNOSIS — I6389 Other cerebral infarction: Secondary | ICD-10-CM | POA: Diagnosis not present

## 2022-08-19 DIAGNOSIS — I2510 Atherosclerotic heart disease of native coronary artery without angina pectoris: Secondary | ICD-10-CM | POA: Diagnosis present

## 2022-08-19 DIAGNOSIS — R4701 Aphasia: Secondary | ICD-10-CM | POA: Diagnosis present

## 2022-08-19 DIAGNOSIS — I63412 Cerebral infarction due to embolism of left middle cerebral artery: Principal | ICD-10-CM | POA: Diagnosis present

## 2022-08-19 DIAGNOSIS — G9341 Metabolic encephalopathy: Secondary | ICD-10-CM | POA: Diagnosis present

## 2022-08-19 DIAGNOSIS — I428 Other cardiomyopathies: Secondary | ICD-10-CM | POA: Diagnosis present

## 2022-08-19 DIAGNOSIS — I639 Cerebral infarction, unspecified: Secondary | ICD-10-CM | POA: Diagnosis not present

## 2022-08-19 DIAGNOSIS — I4891 Unspecified atrial fibrillation: Secondary | ICD-10-CM | POA: Diagnosis present

## 2022-08-19 DIAGNOSIS — Z794 Long term (current) use of insulin: Secondary | ICD-10-CM | POA: Diagnosis not present

## 2022-08-19 DIAGNOSIS — E861 Hypovolemia: Secondary | ICD-10-CM | POA: Diagnosis present

## 2022-08-19 DIAGNOSIS — N179 Acute kidney failure, unspecified: Secondary | ICD-10-CM | POA: Diagnosis present

## 2022-08-19 DIAGNOSIS — Z7982 Long term (current) use of aspirin: Secondary | ICD-10-CM | POA: Diagnosis not present

## 2022-08-19 DIAGNOSIS — Z888 Allergy status to other drugs, medicaments and biological substances status: Secondary | ICD-10-CM | POA: Diagnosis not present

## 2022-08-19 DIAGNOSIS — Z7984 Long term (current) use of oral hypoglycemic drugs: Secondary | ICD-10-CM | POA: Diagnosis not present

## 2022-08-19 DIAGNOSIS — F17210 Nicotine dependence, cigarettes, uncomplicated: Secondary | ICD-10-CM | POA: Diagnosis present

## 2022-08-19 DIAGNOSIS — I255 Ischemic cardiomyopathy: Secondary | ICD-10-CM | POA: Diagnosis present

## 2022-08-19 DIAGNOSIS — R634 Abnormal weight loss: Secondary | ICD-10-CM | POA: Insufficient documentation

## 2022-08-19 DIAGNOSIS — I63512 Cerebral infarction due to unspecified occlusion or stenosis of left middle cerebral artery: Secondary | ICD-10-CM | POA: Diagnosis not present

## 2022-08-19 LAB — URINALYSIS, ROUTINE W REFLEX MICROSCOPIC
Bacteria, UA: NONE SEEN
Bilirubin Urine: NEGATIVE
Glucose, UA: >=500 mg/dL — AB
Hgb urine dipstick: NEGATIVE
Ketones, ur: NEGATIVE mg/dL
Leukocytes,Ua: NEGATIVE
Nitrite: NEGATIVE
Protein, ur: 100 mg/dL — AB
Specific Gravity, Urine: 1.024 (ref 1.005–1.030)
pH: 5 (ref 5.0–8.0)

## 2022-08-19 LAB — HEPATIC FUNCTION PANEL
ALT: 16 U/L (ref 0–44)
AST: 24 U/L (ref 15–41)
Albumin: 3.8 g/dL (ref 3.5–5.0)
Alkaline Phosphatase: 108 U/L (ref 38–126)
Bilirubin, Direct: 0.2 mg/dL (ref 0.0–0.2)
Indirect Bilirubin: 0.8 mg/dL (ref 0.3–0.9)
Total Bilirubin: 1 mg/dL (ref 0.3–1.2)
Total Protein: 8.4 g/dL — ABNORMAL HIGH (ref 6.5–8.1)

## 2022-08-19 LAB — CBG MONITORING, ED
Glucose-Capillary: 336 mg/dL — ABNORMAL HIGH (ref 70–99)
Glucose-Capillary: 419 mg/dL — ABNORMAL HIGH (ref 70–99)
Glucose-Capillary: 469 mg/dL — ABNORMAL HIGH (ref 70–99)
Glucose-Capillary: 553 mg/dL (ref 70–99)
Glucose-Capillary: >600 mg/dL (ref 70–99)
Glucose-Capillary: >600 mg/dL (ref 70–99)
Glucose-Capillary: >600 mg/dL (ref 70–99)
Glucose-Capillary: >600 mg/dL (ref 70–99)

## 2022-08-19 LAB — CBC WITH DIFFERENTIAL/PLATELET
Abs Immature Granulocytes: 0.02 10*3/uL (ref 0.00–0.07)
Basophils Absolute: 0 10*3/uL (ref 0.0–0.1)
Basophils Relative: 0 %
Eosinophils Absolute: 0.2 10*3/uL (ref 0.0–0.5)
Eosinophils Relative: 2 %
HCT: 44 % (ref 39.0–52.0)
Hemoglobin: 15 g/dL (ref 13.0–17.0)
Immature Granulocytes: 0 %
Lymphocytes Relative: 35 %
Lymphs Abs: 2.4 10*3/uL (ref 0.7–4.0)
MCH: 28.8 pg (ref 26.0–34.0)
MCHC: 34.1 g/dL (ref 30.0–36.0)
MCV: 84.5 fL (ref 80.0–100.0)
Monocytes Absolute: 0.4 10*3/uL (ref 0.1–1.0)
Monocytes Relative: 6 %
Neutro Abs: 3.8 10*3/uL (ref 1.7–7.7)
Neutrophils Relative %: 57 %
Platelets: 226 10*3/uL (ref 150–400)
RBC: 5.21 MIL/uL (ref 4.22–5.81)
RDW: 11.7 % (ref 11.5–15.5)
WBC: 6.8 10*3/uL (ref 4.0–10.5)
nRBC: 0 % (ref 0.0–0.2)

## 2022-08-19 LAB — BASIC METABOLIC PANEL
Anion gap: 12 (ref 5–15)
Anion gap: 12 (ref 5–15)
BUN: 41 mg/dL — ABNORMAL HIGH (ref 8–23)
BUN: 40 mg/dL — ABNORMAL HIGH (ref 8–23)
CO2: 25 mmol/L (ref 22–32)
CO2: 26 mmol/L (ref 22–32)
Calcium: 9.9 mg/dL (ref 8.9–10.3)
Calcium: 10 mg/dL (ref 8.9–10.3)
Chloride: 92 mmol/L — ABNORMAL LOW (ref 98–111)
Chloride: 99 mmol/L (ref 98–111)
Creatinine, Ser: 2.62 mg/dL — ABNORMAL HIGH (ref 0.61–1.24)
Creatinine, Ser: 2.42 mg/dL — ABNORMAL HIGH (ref 0.61–1.24)
GFR, Estimated: 27 mL/min — ABNORMAL LOW (ref >60)
GFR, Estimated: 29 mL/min — ABNORMAL LOW (ref >60)
Glucose, Bld: 966 mg/dL (ref 70–99)
Glucose, Bld: 628 mg/dL (ref 70–99)
Potassium: 4.4 mmol/L (ref 3.5–5.1)
Potassium: 3.9 mmol/L (ref 3.5–5.1)
Sodium: 129 mmol/L — ABNORMAL LOW (ref 135–145)
Sodium: 137 mmol/L (ref 135–145)

## 2022-08-19 LAB — I-STAT VENOUS BLOOD GAS, ED
Acid-Base Excess: 1 mmol/L (ref 0.0–2.0)
Bicarbonate: 29 mmol/L — ABNORMAL HIGH (ref 20.0–28.0)
Calcium, Ion: 1.23 mmol/L (ref 1.15–1.40)
HCT: 45 % (ref 39.0–52.0)
Hemoglobin: 15.3 g/dL (ref 13.0–17.0)
O2 Saturation: 63 %
Potassium: 5 mmol/L (ref 3.5–5.1)
Sodium: 132 mmol/L — ABNORMAL LOW (ref 135–145)
TCO2: 31 mmol/L (ref 22–32)
pCO2, Ven: 57.8 mmHg (ref 44–60)
pH, Ven: 7.309 (ref 7.25–7.43)
pO2, Ven: 37 mmHg (ref 32–45)

## 2022-08-19 LAB — LIPASE, BLOOD: Lipase: 58 U/L — ABNORMAL HIGH (ref 11–51)

## 2022-08-19 LAB — PROTIME-INR
INR: 0.9 (ref 0.8–1.2)
Prothrombin Time: 12.2 seconds (ref 11.4–15.2)

## 2022-08-19 LAB — BETA-HYDROXYBUTYRIC ACID: Beta-Hydroxybutyric Acid: 0.76 mmol/L — ABNORMAL HIGH (ref 0.05–0.27)

## 2022-08-19 MED ORDER — POTASSIUM CHLORIDE 10 MEQ/100ML IV SOLN
10.0000 meq | INTRAVENOUS | Status: DC
  Administered 2022-08-19: 10 meq via INTRAVENOUS
  Filled 2022-08-19 (×2): qty 100

## 2022-08-19 MED ORDER — ENOXAPARIN SODIUM 30 MG/0.3ML IJ SOSY
30.0000 mg | PREFILLED_SYRINGE | INTRAMUSCULAR | Status: DC
  Administered 2022-08-19: 30 mg via SUBCUTANEOUS
  Filled 2022-08-19: qty 0.3

## 2022-08-19 MED ORDER — DEXTROSE 50 % IV SOLN: 0.0000 mL | INTRAVENOUS | Status: DC | PRN

## 2022-08-19 MED ORDER — INSULIN REGULAR(HUMAN) IN NACL 100-0.9 UT/100ML-% IV SOLN
INTRAVENOUS | Status: DC
  Administered 2022-08-19: 9 [IU]/h via INTRAVENOUS
  Filled 2022-08-19: qty 100

## 2022-08-19 MED ORDER — DEXTROSE IN LACTATED RINGERS 5 % IV SOLN: INTRAVENOUS | Status: DC

## 2022-08-19 MED ORDER — POTASSIUM CHLORIDE 2 MEQ/ML IV SOLN
INTRAVENOUS | Status: DC
  Filled 2022-08-19 (×2): qty 1000

## 2022-08-19 MED ORDER — LACTATED RINGERS IV BOLUS
20.0000 mL/kg | Freq: Once | INTRAVENOUS | Status: AC
  Administered 2022-08-19: 1452 mL via INTRAVENOUS

## 2022-08-19 MED ORDER — POTASSIUM CHLORIDE 10 MEQ/100ML IV SOLN
10.0000 meq | Freq: Once | INTRAVENOUS | Status: AC
  Administered 2022-08-19: 10 meq via INTRAVENOUS

## 2022-08-19 MED ORDER — LACTATED RINGERS IV SOLN: INTRAVENOUS | Status: DC

## 2022-08-19 NOTE — ED Triage Notes (Signed)
Pt came in [place] pt is having difficulty speaking what he is thinking. Pt answered all questions correctly & is A/Ox4 except his inability to get his words. With much questioning PA was able to get pt to answer that he started having this issue 1 week ago, denies Hx of strokes or any pain while in triage.

## 2022-08-19 NOTE — ED Provider Triage Note (Signed)
Emergency Medicine Provider Triage Evaluation Note  Rodney Schultz , a 62 y.o. male  was evaluated in triage.  Pt complains of partial aphasia.  He states that it has been present for a week.  He has difficulty expressing exactly what is going on, but is able to answer yes or no or directed questions.  He did confirm that the symptoms started 7 days ago.  Denies history of stroke.  Denies headache, numbness, weakness or tingling.  Review of Systems  Positive: As above Negative: As above  Physical Exam  BP 122/89   Pulse 84   Temp 98 F (36.7 C)   Resp 16   Ht 5\' 10"  (1.778 m)   Wt 72.6 kg   SpO2 100%   BMI 22.96 kg/m  Gen:   Awake, no distress   Resp:  Normal effort  MSK:   Moves extremities without difficulty  Other:  No facial asymmetry, slurred speech, unilateral or global weakness, pronator drift.  Normal finger-to-nose and heel-to-shin and shoulder shrug.  Alert and oriented x 4.  Easily follows directions  Medical Decision Making  Medically screening exam initiated at 5:14 PM.  Appropriate orders placed.  Joyice Faster Yum was informed that the remainder of the evaluation will be completed by another provider, this initial triage assessment does not replace that evaluation, and the importance of remaining in the ED until their evaluation is complete.  Labs and CT head ordered   Roylene Reason, Hershal Coria 08/19/22 1717

## 2022-08-19 NOTE — H&P (Incomplete)
Date: 08/20/2022               Patient Name:  Rodney Schultz MRN: [id number]  DOB: [birth year] Age / Sex: 62 y.o., male   PCP: Pcp, No         Medical Service: Internal Medicine Teaching Service         Attending Physician: Dr. Velna Ochs, MD      First Contact: Dr. Mariea Stable, DO  Pager: [phone]    Second Contact: Dr. Farrel Gordon, DO Pager [phone]         After Hours (After 5p/  First Contact Pager: [phone]  weekends / holidays): Second Contact Pager: [phone]   SUBJECTIVE   Chief Complaint: Altered mental status  History of Present Illness: This is a 62 year old male with a past medical history of type 2 diabetes, hypertension, hyperlipidemia, CAD, HFrEF who presents to the emergency department with concerns of altered mental status.  Per charting, patient has had partial aphasia for the past 7 days.  When I go speak to patient on my exam, patient states he has had some trouble speaking, but is unable to quantify how long.  He denies any other complaints.  He denies any trouble swallowing, numbness or tingling in arms or legs, loss of vision, or facial droop.  He states he has been doing fine otherwise.  He reports compliance with his medications.  He states he has been eating and drinking well.  No changes to his bladder or bowel habits.  He denies any cough or congestion.  Patient also reports that we can call his fiance, Rodney Schultz, for more information.  Of note, when we first entered the room, patient did not speak much and had unfinished sentences.  He mostly would point at his left IV.  After it was flushed, he became more awake and alert and able to hold a conversation.  I did reach out to fianc, Rodney Schultz, with patient's consent to get more history.  Patient's fianc states that he has been doing fine until the past 3 weeks when she noticed he was getting more forgetful.  She states that he was not having any trouble holding a conversation, and would always talk on  the phone.  She states that for the past 3 weeks he has been drinking more sodas and notes that he has had a decreased appetite and eating less.  She notes last week he had more cough and sputum production but no fevers or chills.  She states that the cough has since resolved.  Today, she noticed that his responses were a lot more delayed.  He got on the phone with the Social Security office and when the Social Security office representative asked where the patient was born the patient had a delayed response and then Rodney Schultz decided to bring patient to the emergency room.  Patient's fianc states that he has been drinking more sodas and peeing more recently. She also notes that the patient has not been compliant with his medications.  ED Course: Patient initially presented to the emergency room with vital signs showing temperature 98.1 F, respiratory rate 21, pulse 66, blood pressure 130/85, satting at 100% on room air.  Initial labs showed glucose 966 creatinine 2.62, BUN 41, BHA 0.76.  VBG showing pH 7.31. CT head negative for any acute intracranial abnormalities.  EDP started patient on insulin infusion and started fluids.  IMTS consulted for admission.   Past Medical History Type 2 diabetes, hypertension, heart failure with  reduced ejection fraction, CAD Past Medical History:  Diagnosis Date   Diabetes mellitus without complication (HCC)    GERD (gastroesophageal reflux disease)    Hypertension      Meds:  Aspirin 81 mg daily Carvedilol 12.5 mg twice daily Empagliflozin 10 mg daily Repatha 140 mg every 14 days Glipizide 10 mg daily Hydralazine 50 mg 3 times daily Vascepa 2 g twice daily Lantus 15 units daily Metformin 500 mg BID Imdur 30 mg daily Rosuvastatin 40 mg daily Entresto 97-103 mg twice daily Spironolactone 12.5 mg daily Triamcinolone cream  Past Surgical History  Past Surgical History:  Procedure Laterality Date   RIGHT HEART CATH N/A 08/11/2021   Procedure: RIGHT HEART  CATH;  Surgeon: Larey Dresser, MD;  Location: [facility];  Service: Cardiovascular;  Laterality: N/A;   testical  Right     Social:  Lives With: Fiance  Occupation: Retired  Support: Makes his own decision, supported by fiance at home  Level of Function: Independent of all ADLs and IADLs PCP: N/A Substances: Unable to quantify smoking history, the patient has been smoking a lot more recently.  Per fianc, patient has been smoking for about 3 years they have been together, and tried to quit for a few months, and then started back again.  Family History: N/A  Allergies: Allergies as of 08/19/2022 - Review Complete 08/19/2022  Allergen Reaction Noted   Influenza vaccines  03/27/2021    Review of Systems: A complete ROS was negative except as per HPI.   OBJECTIVE:   Physical Exam: Blood pressure 130/85, pulse 66, temperature 98 F (36.7 C), resp. rate (!) 21, height 5\' 10"  (1.778 m), weight 72.6 kg, SpO2 100 %.  Constitutional: Resting in bed in no acute distress  HENT: normocephalic atraumatic, mucous membranes dry Eyes: conjunctiva non-erythematous, PERRLA, EOMI Cardiovascular: regular rate and rhythm, no m/r/g Pulmonary/Chest: normal work of breathing on room air, lungs clear to auscultation bilaterally Neurological: alert & oriented x 3, 5/5 strength in bilateral upper and lower extremities, cranial nerves II through XII intact.  No sensation deficits.  No acute neurological deficits appreciated Skin: No rashes   Labs: CBC    Component Value Date/Time   WBC 6.8 08/19/2022 1720   RBC 5.21 08/19/2022 1720   HGB 15.3 08/19/2022 2020   HCT 45.0 08/19/2022 2020   PLT 226 08/19/2022 1720   MCV 84.5 08/19/2022 1720   MCH 28.8 08/19/2022 1720   MCHC 34.1 08/19/2022 1720   RDW 11.7 08/19/2022 1720   LYMPHSABS 2.4 08/19/2022 1720   MONOABS 0.4 08/19/2022 1720   EOSABS 0.2 08/19/2022 1720   BASOSABS 0.0 08/19/2022 1720     CMP     Component Value Date/Time    NA 137 08/19/2022 2138   NA 145 (H) 10/22/2020 1324   K 3.9 08/19/2022 2138   CL 99 08/19/2022 2138   CO2 26 08/19/2022 2138   GLUCOSE 628 (HH) 08/19/2022 2138   BUN 40 (H) 08/19/2022 2138   BUN 18 10/22/2020 1324   CREATININE 2.42 (H) 08/19/2022 2138   CALCIUM 10.0 08/19/2022 2138   PROT 8.4 (H) 08/19/2022 1938   ALBUMIN 3.8 08/19/2022 1938   AST 24 08/19/2022 1938   ALT 16 08/19/2022 1938   ALKPHOS 108 08/19/2022 1938   BILITOT 1.0 08/19/2022 1938   GFRNONAA 29 (L) 08/19/2022 2138   GFRAA >60 04/07/2019 1015    Imaging: CT head: negative for any acute intracranial processes  EKG: personally reviewed my  interpretation is Normal sinus rhythm with a rate of 90. Normal axis. No acute ST segment changes noted. There a prolonged Qtc at 477. When compared to previous ECG there is more isoelectric wave in lead I on new EKG but not much different.   ASSESSMENT & PLAN:   Assessment & Plan by Problem: Principal Problem:   Hyperosmolar hyperglycemic state (HHS) (HCC)   Rodney Schultz is a 62 y.o.  male with a past medical history of type 2 diabetes, hypertension, hyperlipidemia, CAD, HFrEF who presents to the emergency department with concerns of altered mental status. Patient found to be in [facility] and admitted for further evaluation and work up.   #Acute metabolic encephalopathy #HHS vs DKA #T2DM Patient presented with concerns of altered mental status. Patient reports to having delayed responses at home and brought to the emergency department. On arrival patient was not able to answer questions. Concerning for new stroke, patient was taken for head CT which did not show new stroke or any structural findings that could explain patient's state. On my exam, patient did not have any focal neurological deficits. Initial labs and history did not point towards an infectious cause. Patient was found to have significantly elevated blood glucose level at 966. Bicarb normal, and pH normal so less  likely DKA, but will get UA look for ketones. With altered mental status, elevated calculated osm 326, and significantly elevated blood glucose this is likely HHS. Less likely caused by infection or infarction. More likely due to poor medication adherence. Patient's BHA was elevated, however DKA seems to be less likely. Patient will need counseling on the important of adherence to mediation regimen. A1c improved from 15 to 9 05/13/22, so it seems as if patient was adherent. Unsure what may have changed. Medications include empagliflozin 10 mg daily, glipizide 10 mg daily, Lantus 15 units daily, Metformin 500 mg BID. Of note, patient does not have PCP and could benefit [facility]. Patient was started on insulin infusion and glucose dropped down well. Patient transitioned to long acting home dose of 15 units.  -Start Semglee 15 units  -SSI -Monitor CBGs -repeat A1c pending  -Holding all other home meds -Diabetic coordinator   #AKI on CKD stage 3b Creatinine elevated at 2.62, GFR 27, with BUN 41. Baseline creatinine around 1.9-2.2. In setting of HHS this is due to volume depletion and improvement is expected with fluids. He was given 1.5L in ED and remains on LR at 125 mL/hr. He is likely 8-12 L down. -trend BMP -strict I and Os -avoid nephrotoxic agents  -IVFs  #HFrEF  #CAD #Mixed ischemic/nonischemic cardiomyopathy Diagnosed 11/22 and follows with cardiology. Most recent EF 42% on cardiac MRI 11/23. Heart failure suspected to be in setting of CAD and hypertension. 1/23 heart cath showed ramus 100% stenosis with 80-90% multiple vessels but no good PCI options and he is not a candidate CABG with low EF so has been medically managed in setting of no anginal chest pain. Cardiac MRI 11/23 showed EF 42% and mid-apical anterolateral/ inferolateral walls are not viable. EKG with no acute ischemic changes noted. Patient has no chest pain or shortness of breath. No concern for ACS. Home meds  include Aspirin 81 mg daily, coreg 12.5 mg BID, empagliflozin 10 mg daily, Imdur 30 mg daily, entresto 97-103 BID, and spironolactone 12.5 mg daily.  -In the setting of AKI, hold entresto, Empagliflozin, and spironolactone  -Hold home other home meds in setting of altered mental  status.  -Strict I' and O's                  -Daily weights   #HTN Patient does have history of HTN. Renal dopplers showed mild renal artery stenosis. Patient pressures measuring well. Home medications include Entresto 97-103 mg BID, spironolactone 12.5 mg qd, hydral 50 mg TID, co-reg 12.5 mg BID. -Hold Entresto and spironolactone in setting of AKI -Hold other home meds in the setting of altered mental status  -Monitor blood pressure   #HLD Patient has a past medical history of HLD. Most recent lipid panel on 05/04/2022 showing total cholesterol 191, TG 280, LDL 107, and HDL 28. Home medications include rosuvastatin 40 mg daily and Repatha 140 mg every 2 weeks. Patient was on atorvastatin 80 mg, but had trouble swallowing pill so patient was switched to rosuvastatin. Patient recently started on Vascepa 2g BID as well. -Hold home meds in the setting of altered mental status   Diet: NPO VTE: Enoxaparin IVF: D5LR,125cc/hr Code: Full  Prior to Admission Living Arrangement: Home, living with fiance Anticipated Discharge Location: Home Barriers to Discharge: Clinical Improvement   Dispo: Admit patient to Inpatient with expected length of stay greater than 2 midnights.  Signed: Leigh Aurora, DO Internal Medicine Resident PGY-1 Pager: [phone] 08/20/2022, 12:29 AM

## 2022-08-19 NOTE — ED Provider Notes (Signed)
[place] EMERGENCY DEPARTMENT AT [facility] Provider Note   CSN: [id number] Arrival date & time: 08/19/22  1653     History  Chief Complaint  Patient presents with   Aphasia    Rodney Schultz is a 62 y.o. male. With past medical history of hypertension, type 2 diabetes, GERD who presents to the emergency department with aphasia.   Level 5 caveat: Aphasia versus altered mental status. Significant other is at bedside.  She states that over the past 3 weeks the patient has been very thirsty and peeing a lot.  Per the triage note, patient has had difficulty speaking over the past 1 week. He is able to answer yes or no questions for me. He does state that he is nauseated but denies abdominal pain. He states that he has been taking blood glucoses at home but is unable to tell me how high they have been. States he is taking his insulin. Denies other symptoms.   HPI     Home Medications Prior to Admission medications   Medication Sig Start Date End Date Taking? Authorizing Provider  aspirin (ASPIRIN CHILDRENS) 81 MG chewable tablet Chew 1 tablet (81 mg total) by mouth daily. 12/02/21 12/02/22  Laurey Morale, MD  Blood Glucose Monitoring Suppl (ACCU-CHEK GUIDE) w/Device KIT Use as directed twice daily. 04/02/22     carvedilol (COREG) 12.5 MG tablet Take 1 tablet (12.5 mg total) by mouth 2 (two) times daily with a meal. 02/12/22   Laurey Morale, MD  empagliflozin (JARDIANCE) 10 MG TABS tablet Take 1 tablet (10 mg total) by mouth daily before breakfast. 02/12/22   Laurey Morale, MD  Evolocumab (REPATHA SURECLICK) 140 MG/ML SOAJ Inject 140 mg into the skin every 14 (fourteen) days. 05/19/22   Laurey Morale, MD  glipiZIDE (GLUCOTROL) 10 MG tablet Take 1 tablet (10 mg total) by mouth daily before breakfast. 03/27/21   Tysinger, Kermit Balo, PA-C  glucose blood test strip Use twice daily to check blood sugars 12/17/21   Laurey Morale, MD  hydrALAZINE (APRESOLINE) 50 MG tablet  Take 1 tablet (50 mg total) by mouth 3 (three) times daily. 02/12/22   Laurey Morale, MD  icosapent Ethyl (VASCEPA) 1 g capsule Take 2 capsules (2 g total) by mouth 2 (two) times daily. 05/04/22   Milford, Anderson Malta, FNP  insulin glargine (LANTUS) 100 unit/mL SOPN Inject 15 Units into the skin daily. 05/14/22     Insulin Pen Needle (UNIFINE PENTIPS) 32G X 4 MM MISC Use daily with insulin 05/14/22     isosorbide mononitrate (IMDUR) 30 MG 24 hr tablet Take 1 tablet (30 mg total) by mouth daily. 02/12/22   Laurey Morale, MD  Lancets Micro Thin 33G MISC Use to check blood sugar twice daily 12/17/21   Laurey Morale, MD  metFORMIN (GLUCOPHAGE-XR) 500 MG 24 hr tablet Take 1 tablet (500 mg total) by mouth 2 (two) times daily. 08/13/21 08/13/22  Laurey Morale, MD  rosuvastatin (CRESTOR) 40 MG tablet Take 1 tablet (40 mg total) by mouth daily. 05/13/22   Laurey Morale, MD  sacubitril-valsartan (ENTRESTO) 97-103 MG Take 1 tablet by mouth 2 (two) times daily. 03/25/22   Laurey Morale, MD  spironolactone (ALDACTONE) 25 MG tablet Take 0.5 tablets (12.5 mg total) by mouth daily. 03/04/22   Laurey Morale, MD  triamcinolone cream (KENALOG) 0.1 % Apply 1 Application topically 2 (two) times daily. 04/29/22   Raspet, Noberto Retort, PA-C  loratadine (CLARITIN) 10 MG tablet Take 10 mg by mouth daily as needed for allergies.  09/13/20  [provider]      Allergies    Influenza vaccines    Review of Systems   Review of Systems  Unable to perform ROS: Mental status change    Physical Exam Updated Vital Signs BP 130/85   Pulse 66   Temp 98 F (36.7 C)   Resp (!) 21   Ht 5\' 10"  (1.778 m)   Wt 72.6 kg   SpO2 100%   BMI 22.96 kg/m  Physical Exam Vitals and nursing note reviewed.  Constitutional:      General: He is not in acute distress.    Appearance: Normal appearance. He is not ill-appearing or toxic-appearing.  HENT:     Head: Normocephalic and atraumatic.     Mouth/Throat:      Mouth: Mucous membranes are dry.     Pharynx: Oropharynx is clear.  Eyes:     General: No visual field deficit or scleral icterus.    Extraocular Movements: Extraocular movements intact.  Cardiovascular:     Rate and Rhythm: Normal rate and regular rhythm.     Pulses: Normal pulses.     Heart sounds: No murmur heard. Pulmonary:     Effort: Pulmonary effort is normal. No respiratory distress.     Breath sounds: Normal breath sounds.  Abdominal:     General: Bowel sounds are normal. There is no distension.     Palpations: Abdomen is soft.     Tenderness: There is no abdominal tenderness. There is no guarding.  Musculoskeletal:        General: Normal range of motion.     Cervical back: Neck supple.  Skin:    General: Skin is warm and dry.     Capillary Refill: Capillary refill takes less than 2 seconds.     Findings: No rash.  Neurological:     Mental Status: He is alert.     Cranial Nerves: No facial asymmetry.     Sensory: Sensation is intact.     Motor: Motor function is intact.     Coordination: Coordination is intact.     Comments: Patient only able to respond yes or no He does not verbally answer questions past this. He is understanding language by following my commands. No facial droop or other cranial nerve deficits on exam. No focal weakness.      ED Results / Procedures / Treatments   Labs (all labs ordered are listed, but only abnormal results are displayed) Labs Reviewed  BASIC METABOLIC PANEL - Abnormal; Notable for the following components:      Result Value   Sodium 129 (*)    Chloride 92 (*)    Glucose, Bld 966 (*)    BUN 41 (*)    Creatinine, Ser 2.62 (*)    GFR, Estimated 27 (*)    All other components within normal limits  BETA-HYDROXYBUTYRIC ACID - Abnormal; Notable for the following components:   Beta-Hydroxybutyric Acid 0.76 (*)    All other components within normal limits  HEPATIC FUNCTION PANEL - Abnormal; Notable for the following components:    Total Protein 8.4 (*)    All other components within normal limits  LIPASE, BLOOD - Abnormal; Notable for the following components:   Lipase 58 (*)    All other components within normal limits  I-STAT VENOUS BLOOD GAS, ED - Abnormal; Notable for the following components:   Bicarbonate  29.0 (*)    Sodium 132 (*)    All other components within normal limits  CBG MONITORING, ED - Abnormal; Notable for the following components:   Glucose-Capillary >600 (*)    All other components within normal limits  CBG MONITORING, ED - Abnormal; Notable for the following components:   Glucose-Capillary >600 (*)    All other components within normal limits  CBC WITH DIFFERENTIAL/PLATELET  PROTIME-INR  URINALYSIS, ROUTINE W REFLEX MICROSCOPIC  OSMOLALITY    EKG None  Radiology CT Head Wo Contrast  Result Date: 08/19/2022 CLINICAL DATA:  Mental status change, difficulty speaking. EXAM: CT HEAD WITHOUT CONTRAST TECHNIQUE: Contiguous axial images were obtained from the base of the skull through the vertex without intravenous contrast. RADIATION DOSE REDUCTION: This exam was performed according to the departmental dose-optimization program which includes automated exposure control, adjustment of the mA and/or kV according to patient size and/or use of iterative reconstruction technique. COMPARISON:  Head CT 11/03/2021 FINDINGS: Brain: No evidence of acute infarction, hemorrhage, hydrocephalus, extra-axial collection or mass lesion/mass effect. Vascular: Atherosclerotic calcifications are present within the cavernous internal carotid arteries. Skull: Normal. Negative for fracture or focal lesion. Sinuses/Orbits: No acute finding. Other: None. IMPRESSION: No acute intracranial abnormality. Electronically Signed   By: Ronney Asters M.D.   On: 08/19/2022 17:58    Procedures .Critical Care  Performed by: Mickie Hillier, PA-C Authorized by: Mickie Hillier, PA-C   Critical care provider statement:    Critical  care time (minutes):  35   Critical care time was exclusive of:  Separately billable procedures and treating other patients   Critical care was necessary to treat or prevent imminent or life-threatening deterioration of the following conditions:  Endocrine crisis   Critical care was time spent personally by me on the following activities:  Development of treatment plan with patient or surrogate, discussions with consultants, discussions with primary provider, evaluation of patient's response to treatment, examination of patient, interpretation of cardiac output measurements, obtaining history from patient or surrogate, review of old charts, re-evaluation of patient's condition, pulse oximetry, ordering and review of radiographic studies, ordering and review of laboratory studies and ordering and performing treatments and interventions   I assumed direction of critical care for this patient from another provider in my specialty: no     Care discussed with: admitting provider       Medications Ordered in ED Medications  insulin regular, human (MYXREDLIN) 100 units/ 100 mL infusion (9 Units/hr Intravenous Infusion Verify 08/19/22 2042)  lactated ringers infusion (has no administration in time range)  dextrose 5 % in lactated ringers infusion (has no administration in time range)  dextrose 50 % solution 0-50 mL (has no administration in time range)  potassium chloride 10 mEq in 100 mL IVPB (10 mEq Intravenous New Bag/Given 08/19/22 2008)  lactated ringers bolus 1,452 mL (1,452 mLs Intravenous New Bag/Given 08/19/22 2000)    ED Course/ Medical Decision Making/ A&P  Medical Decision Making Amount and/or Complexity of Data Reviewed Labs: ordered.  Risk Prescription drug management. Decision regarding hospitalization.   Initial Impression and Ddx 62 year old male who presents to the emergency department for altered mental status, found to have a blood glucose of 966.  He does have a history of  type 2 diabetes. Patient PMH that increases complexity of ED encounter: Type 2 diabetes, hypertension, GERD Differential: Stroke, DKA, HHS, toxidrome, etc.  Interpretation of Diagnostics I independent reviewed and interpreted the labs as followed: Sodium 129 (corrected to 143),  glucose 996, creatinine 2.62 mildly elevated but no AKI.  Lipase 58.  VBG without acidosis.  Bicarb is within normal limits, BHB 0.76, osmolality pending  - I independently visualized the following imaging with scope of interpretation limited to determining acute life threatening conditions related to emergency care: CT head, which revealed no acute abnormalities  Patient Reassessment and Ultimate Disposition/Management This is a 62 year old male who presents to the emergency department with expressive aphasia in the setting of severe hyperglycemia. From best recollection the symptoms have been going on for 1 week ago code stroke was not ordered. CT head without contrast is negative for acute stroke or bleed. In triage she was found to have severely elevated glucose and BMP came back at 966. He appears to be in [facility] given that he is not acidotic.  VBG with normal pH.  Bicarb is within normal limits.  No anion gap.  May be altered from his severe hyperglycemia.  He was started on IV fluids and insulin drip. Osmolality is pending but feels certain that he is in HHS and not DKA at this time.  Will need admission for ongoing correction of his glucose and reevaluation of his mental status.  Did not order MRI at this time as I would like to correct his glucose and reassess his speech and neuroassessment after normalization.  Consulted and spoke with Dr. Howie Ill, hospitalist with internal medicine team.  She agrees to admit the patient.  Patient management required discussion with the following services or consulting groups:  Hospitalist Service  Complexity of Problems Addressed Acute illness or injury that poses threat of  life of bodily function  Additional Data Reviewed and Analyzed Further history obtained from: Further history from spouse/family member, Past medical history and medications listed in the EMR, Care Everywhere, and Prior labs/imaging results  Patient Encounter Risk Assessment Consideration of hospitalization  Final Clinical Impression(s) / ED Diagnoses Final diagnoses:  Hyperosmolar hyperglycemic state (HHS) [facility])    Rx / DC Orders ED Discharge Orders     None         Mickie Hillier, PA-C 08/19/22 2049    Teressa Lower, MD 08/20/22 1240

## 2022-08-19 NOTE — ED Notes (Signed)
Patient brought to room 32.

## 2022-08-19 NOTE — Hospital Course (Addendum)
RR21  Cc difficulty speaking 1 week ago  ED 600  ED course: LR 1.5: K 10 meq x3 Insulin infusion UA pending  Na129 Cl 92 G 966 pH 7.3 Ct HEAD- no acute intracranial  Lipase 58 BHO 0.76    PQZ:R0QT  Acute metabolic encephalopathy HHS vs DKA  HHS in setting of not taking insulin. No signs of infection with blah blah, EKG showed no ischemic changes,   A1c improved from 15 to 9 05/13/22. Medications include empagliflozin 10, glipizide 10, Lantus, Metformin 500 mg BID.  -Endotool -trend K every 4 hours -LR with K -repeat A1c -Holding home meds  AKI on CKD stage 3b Creatinine elevated at 2.62, GFR 27, with BUN 41. Baseline creatinine around . In setting of HHS this is due to volume depletion and improvement is expected with fluids. He was given 1.5L in ED and remains on LR at 125 mL/hr. He is likely 8-12 L down. -trend BMP -strict I and Os  HFrEF EF 42% on cardiac MRI 11/23 CAD Diagnosed 11/22 and follows with cardiology. Heart failure suspected to be in setting of CAD and hypertension. 1/23 heart cath showed ramus 100% stenosis with 80-90% multiple vessels but no good PCI options and he is not a candidate CABG with low EF so has been medically managed in setting of no anginal chest pain. Cardiac MRI 11/23 showed EF 42% and mid-apical anterolateral/ inferolateral walls are not viable. -coreg 12.5 BID -asa 81 mg -Imdur 30 mg  HTN Renal dopplers showed mild renal artery stenosis. -home medications include Entresto 97-103 mg BID, spironolactone 12.5 mg qd, hydral 50 TID  HLD Rosuvastatin 40 mg Repatha         02/01: said sugar got really high and he started hallucinating, couldn't get it together. Says he has only been diabetic for about 1 year or so. Episode like this w hallucinations has never happened. Says he sees someone at [facility]? Says he checks his sugar at home and they have been low, he is compliant with meds he says. Says he's still having trouble  with speech. Can identify pen, ID badge, hand, phone. Can repeat "no if's and's or but's" on second try.no weakness on one side of body.  2/2: PT said he hasn't been hungry but he can eat. Talks with fiance says that he has been wobbly, and has been peeing very frequiently.

## 2022-08-19 NOTE — H&P (Incomplete)
Date: 08/19/2022               Patient Name:  Rodney Schultz MRN: [id number]  DOB: 1960-09-22 Age / Sex: 62 y.o., male   PCP: Pcp, No         Medical Service: Internal Medicine Teaching Service         Attending Physician: Dr. Teressa Lower, MD      First Contact: Dr. Drucie Opitz, MD Pager [phone]    Second Contact: Dr. Farrel Gordon, DO Pager [phone]         After Hours (After 5p/  First Contact Pager: [phone]  weekends / holidays): Second Contact Pager: [phone]   SUBJECTIVE   Chief Complaint: Altered mental status  History of Present Illness: This is a 62 year old male with a past medical history of type 2 diabetes, hypertension, hyperlipidemia, CAD, HFrEF who presents to the emergency department with concerns of altered mental status.  Per charting, patient has had partial aphasia for the past 7 days.  When I go speak to patient on my exam, patient states he has had some trouble speaking, but is unable to quantify how long.  He denies any other complaints.  He denies any trouble swallowing, numbness or tingling in arms or legs, loss of vision, or facial droop.  He states he has been doing fine otherwise.  He reports compliance with his medications.  He states he has been eating and drinking well.  No changes to his bladder or bowel habits.  He denies any cough or congestion.  Patient also reports that we can call his fiance, Rodney Schultz, for more information.  Of note, when we first entered the room, patient did not speak much and had unfinished sentences.  He mostly would point at his left IV.  After it was flushed, he became more awake and alert and able to hold a conversation.  I did reach out to fianc, Rodney Schultz, with patient's consent to get more history.  Patient's fianc states that he has been doing fine until the past 3 weeks when she noticed he was getting more forgetful.  She states that he was not having any trouble holding a conversation, and would always talk on the phone.   She states that for the past 3 weeks he has been drinking more sodas and notes that he has had a decreased appetite and eating less.  She notes last week he had more cough and sputum production but no fevers or chills.  She states that has since resolved.  Today, she noticed that his responses were a lot more delayed.  He got on the phone with the Social Security office and when the Social Security office representative asked where the patient was born the patient had a delayed response and then decided to bring patient to the emergency room.  Patient's fianc states that he has been drinking more sodas and peeing more recently. She also notes that the patient has not been compliant with his medications.  ED Course: Patient initially presented to the emergency room with vital signs showing temperature 98.1 F, respiratory rate 21, pulse 66, blood pressure 130/85, satting at 100% on room air.  Initial labs showed glucose 96 creatinine 2.62, BUN 41, BHA 0,76  Past Medical History Type 2 diabetes, GERD, hypertension, heart failure with reduced ejection fraction, CAD Past Medical History:  Diagnosis Date  . Diabetes mellitus without complication ([place]   . GERD (gastroesophageal reflux disease)   . Hypertension  Meds:  No outpatient medications have been marked as taking for the 08/19/22 encounter [facility] Encounter).    Past Surgical History  Past Surgical History:  Procedure Laterality Date  . RIGHT HEART CATH N/A 08/11/2021   Procedure: RIGHT HEART CATH;  Surgeon: Larey Dresser, MD;  Location: [facility];  Service: Cardiovascular;  Laterality: N/A;  . testical  Right     Social:  Lives With: Occupation: Support: Level of Function: PCP: Substances:  Family History: ***  Allergies: Allergies as of 08/19/2022 - Review Complete 08/19/2022  Allergen Reaction Noted  . Influenza vaccines  03/27/2021    Review of Systems: A complete ROS was negative except as per HPI.    OBJECTIVE:   Physical Exam: Blood pressure 130/85, pulse 66, temperature 98 F (36.7 C), resp. rate (!) 21, height 5\' 10"  (1.778 m), weight 72.6 kg, SpO2 100 %.  Constitutional: well-appearing *** sitting in ***, in no acute distress HENT: normocephalic atraumatic, mucous membranes moist Eyes: conjunctiva non-erythematous Neck: supple Cardiovascular: regular rate and rhythm, no m/r/g Pulmonary/Chest: normal work of breathing on room air, lungs clear to auscultation bilaterally Abdominal: soft, non-tender, non-distended MSK: normal bulk and tone Neurological: alert & oriented x 3, 5/5 strength in bilateral upper and lower extremities, normal gait Skin: warm and dry Psych: ***  Labs: CBC    Component Value Date/Time   WBC 6.8 08/19/2022 1720   RBC 5.21 08/19/2022 1720   HGB 15.3 08/19/2022 2020   HCT 45.0 08/19/2022 2020   PLT 226 08/19/2022 1720   MCV 84.5 08/19/2022 1720   MCH 28.8 08/19/2022 1720   MCHC 34.1 08/19/2022 1720   RDW 11.7 08/19/2022 1720   LYMPHSABS 2.4 08/19/2022 1720   MONOABS 0.4 08/19/2022 1720   EOSABS 0.2 08/19/2022 1720   BASOSABS 0.0 08/19/2022 1720     CMP     Component Value Date/Time   NA 132 (L) 08/19/2022 2020   NA 145 (H) 10/22/2020 1324   K 5.0 08/19/2022 2020   CL 92 (L) 08/19/2022 1720   CO2 25 08/19/2022 1720   GLUCOSE 966 (HH) 08/19/2022 1720   BUN 41 (H) 08/19/2022 1720   BUN 18 10/22/2020 1324   CREATININE 2.62 (H) 08/19/2022 1720   CALCIUM 9.9 08/19/2022 1720   PROT 8.4 (H) 08/19/2022 1938   ALBUMIN 3.8 08/19/2022 1938   AST 24 08/19/2022 1938   ALT 16 08/19/2022 1938   ALKPHOS 108 08/19/2022 1938   BILITOT 1.0 08/19/2022 1938   GFRNONAA 27 (L) 08/19/2022 1720   GFRAA >60 04/07/2019 1015    Imaging:  EKG: personally reviewed my interpretation is***. Prior EKG***  ASSESSMENT & PLAN:   Assessment & Plan by Problem: Active Problems:   * No active hospital problems. *   Rodney Schultz is a 62 y.o. person living  with a history of *** who presented with *** and admitted for *** on hospital day 0  #HHS #Type 2 diabetes mellitus   #Altered mental status   #Acute on CKD3B  #CAD #HFrEF #Mixed ischemic/nonischemic cardiomyopathy   #Hypertension   #Hyperlipidemia   #GERD   Diet: {NAMES:3044014::"Normal","Heart Healthy","Carb-Modified","Renal","Carb/Renal","NPO","TPN","Tube Feeds"} VTE: {NAMES:3044014::"Heparin","Enoxaparin","SCDs","DOAC","None"} IVF: {NAMES:3044014::"None","NS","1/2 NS","LR","D5","D10"},{NAMES:3044014::"None","10cc/hr","25cc/hr","50cc/hr","75cc/hr","100cc/hr","110cc/hr","125cc/hr","Bolus"} Code: {NAMES:3044014::"Full","DNR","DNI","DNR/DNI","Comfort Care","Unknown"}  Prior to Admission Living Arrangement: {NAMES:3044014::"Home, living ***","SNF, ***","Homeless","***"} Anticipated Discharge Location: {NAMES:3044014::"Home","SNF","CIR","***"} Barriers to Discharge: ***  Dispo: Admit patient to {STATUS:3044014::"Observation with expected length of stay less than 2 midnights.","Inpatient with expected length of stay greater than 2 midnights."}  Signed: Leigh Aurora, DO Internal Medicine Resident PGY-1  Pager: [phone] 08/19/2022, 8:44 PM

## 2022-08-20 ENCOUNTER — Inpatient Hospital Stay (HOSPITAL_COMMUNITY): Payer: Medicaid Other

## 2022-08-20 DIAGNOSIS — E1100 Type 2 diabetes mellitus with hyperosmolarity without nonketotic hyperglycemic-hyperosmolar coma (NKHHC): Secondary | ICD-10-CM | POA: Diagnosis not present

## 2022-08-20 DIAGNOSIS — I63512 Cerebral infarction due to unspecified occlusion or stenosis of left middle cerebral artery: Secondary | ICD-10-CM | POA: Diagnosis not present

## 2022-08-20 DIAGNOSIS — R634 Abnormal weight loss: Secondary | ICD-10-CM | POA: Insufficient documentation

## 2022-08-20 DIAGNOSIS — Z794 Long term (current) use of insulin: Secondary | ICD-10-CM | POA: Diagnosis not present

## 2022-08-20 DIAGNOSIS — G9341 Metabolic encephalopathy: Secondary | ICD-10-CM

## 2022-08-20 LAB — POTASSIUM
Potassium: 4.4 mmol/L (ref 3.5–5.1)
Potassium: 3.8 mmol/L (ref 3.5–5.1)

## 2022-08-20 LAB — CBC
HCT: 41.5 % (ref 39.0–52.0)
Hemoglobin: 14.8 g/dL (ref 13.0–17.0)
MCH: 29.4 pg (ref 26.0–34.0)
MCHC: 35.7 g/dL (ref 30.0–36.0)
MCV: 82.3 fL (ref 80.0–100.0)
Platelets: 224 10*3/uL (ref 150–400)
RBC: 5.04 MIL/uL (ref 4.22–5.81)
RDW: 11.7 % (ref 11.5–15.5)
WBC: 7.9 10*3/uL (ref 4.0–10.5)
nRBC: 0 % (ref 0.0–0.2)

## 2022-08-20 LAB — CBG MONITORING, ED
Glucose-Capillary: 159 mg/dL — ABNORMAL HIGH (ref 70–99)
Glucose-Capillary: 183 mg/dL — ABNORMAL HIGH (ref 70–99)
Glucose-Capillary: 198 mg/dL — ABNORMAL HIGH (ref 70–99)
Glucose-Capillary: 200 mg/dL — ABNORMAL HIGH (ref 70–99)
Glucose-Capillary: 229 mg/dL — ABNORMAL HIGH (ref 70–99)
Glucose-Capillary: 245 mg/dL — ABNORMAL HIGH (ref 70–99)
Glucose-Capillary: 254 mg/dL — ABNORMAL HIGH (ref 70–99)
Glucose-Capillary: 276 mg/dL — ABNORMAL HIGH (ref 70–99)
Glucose-Capillary: 303 mg/dL — ABNORMAL HIGH (ref 70–99)

## 2022-08-20 LAB — BASIC METABOLIC PANEL
Anion gap: 10 (ref 5–15)
BUN: 34 mg/dL — ABNORMAL HIGH (ref 8–23)
CO2: 28 mmol/L (ref 22–32)
Calcium: 10.3 mg/dL (ref 8.9–10.3)
Chloride: 103 mmol/L (ref 98–111)
Creatinine, Ser: 2.3 mg/dL — ABNORMAL HIGH (ref 0.61–1.24)
GFR, Estimated: 31 mL/min — ABNORMAL LOW (ref >60)
Glucose, Bld: 218 mg/dL — ABNORMAL HIGH (ref 70–99)
Potassium: 3.1 mmol/L — ABNORMAL LOW (ref 3.5–5.1)
Sodium: 141 mmol/L (ref 135–145)

## 2022-08-20 LAB — GLUCOSE, CAPILLARY
Glucose-Capillary: 457 mg/dL — ABNORMAL HIGH (ref 70–99)
Glucose-Capillary: 443 mg/dL — ABNORMAL HIGH (ref 70–99)
Glucose-Capillary: 445 mg/dL — ABNORMAL HIGH (ref 70–99)
Glucose-Capillary: 484 mg/dL — ABNORMAL HIGH (ref 70–99)
Glucose-Capillary: 386 mg/dL — ABNORMAL HIGH (ref 70–99)
Glucose-Capillary: 297 mg/dL — ABNORMAL HIGH (ref 70–99)
Glucose-Capillary: 192 mg/dL — ABNORMAL HIGH (ref 70–99)
Glucose-Capillary: 155 mg/dL — ABNORMAL HIGH (ref 70–99)
Glucose-Capillary: 150 mg/dL — ABNORMAL HIGH (ref 70–99)

## 2022-08-20 LAB — HIV ANTIBODY (ROUTINE TESTING W REFLEX): HIV Screen 4th Generation wRfx: NONREACTIVE

## 2022-08-20 LAB — OSMOLALITY: Osmolality: 347 mosm/kg (ref 275–295)

## 2022-08-20 MED ORDER — INSULIN REGULAR(HUMAN) IN NACL 100-0.9 UT/100ML-% IV SOLN
INTRAVENOUS | Status: DC
  Administered 2022-08-20: 9 [IU]/h via INTRAVENOUS
  Filled 2022-08-20: qty 100

## 2022-08-20 MED ORDER — DEXTROSE IN LACTATED RINGERS 5 % IV SOLN: INTRAVENOUS | Status: DC

## 2022-08-20 MED ORDER — ENOXAPARIN SODIUM 40 MG/0.4ML IJ SOSY
40.0000 mg | PREFILLED_SYRINGE | INTRAMUSCULAR | Status: AC
  Administered 2022-08-20 – 2022-08-21 (×2): 40 mg via SUBCUTANEOUS
  Filled 2022-08-20 (×2): qty 0.4

## 2022-08-20 MED ORDER — POLYETHYLENE GLYCOL 3350 17 G PO PACK: 17.0000 g | PACK | Freq: Every day | ORAL | Status: DC

## 2022-08-20 MED ORDER — ASPIRIN 325 MG PO TABS
325.0000 mg | ORAL_TABLET | Freq: Every day | ORAL | Status: DC
  Administered 2022-08-20: 325 mg via ORAL
  Filled 2022-08-20 (×2): qty 1

## 2022-08-20 MED ORDER — POTASSIUM CHLORIDE 20 MEQ PO PACK
40.0000 meq | PACK | Freq: Once | ORAL | Status: AC
  Administered 2022-08-20: 40 meq via ORAL
  Filled 2022-08-20: qty 2

## 2022-08-20 MED ORDER — POLYETHYLENE GLYCOL 3350 17 G PO PACK: 17.0000 g | PACK | Freq: Every day | ORAL | Status: DC | PRN

## 2022-08-20 MED ORDER — INSULIN ASPART 100 UNIT/ML IJ SOLN
0.0000 [IU] | INTRAMUSCULAR | Status: DC
  Administered 2022-08-20: 11 [IU] via SUBCUTANEOUS

## 2022-08-20 MED ORDER — STROKE: EARLY STAGES OF RECOVERY BOOK
Freq: Once | Status: AC
  Filled 2022-08-20: qty 1

## 2022-08-20 MED ORDER — SENNOSIDES-DOCUSATE SODIUM 8.6-50 MG PO TABS: 1.0000 | ORAL_TABLET | Freq: Every day | ORAL | Status: DC

## 2022-08-20 MED ORDER — POTASSIUM CHLORIDE 2 MEQ/ML IV SOLN
INTRAVENOUS | Status: DC
  Filled 2022-08-20: qty 1000

## 2022-08-20 MED ORDER — DEXTROSE 50 % IV SOLN: 0.0000 mL | INTRAVENOUS | Status: DC | PRN

## 2022-08-20 MED ORDER — ASPIRIN 300 MG RE SUPP
300.0000 mg | Freq: Every day | RECTAL | Status: DC
  Filled 2022-08-20 (×2): qty 1

## 2022-08-20 MED ORDER — INSULIN GLARGINE-YFGN 100 UNIT/ML ~~LOC~~ SOLN
15.0000 [IU] | Freq: Once | SUBCUTANEOUS | Status: AC
  Administered 2022-08-20: 15 [IU] via SUBCUTANEOUS
  Filled 2022-08-20 (×2): qty 0.15

## 2022-08-20 MED ORDER — SENNOSIDES-DOCUSATE SODIUM 8.6-50 MG PO TABS: 1.0000 | ORAL_TABLET | Freq: Every evening | ORAL | Status: DC | PRN

## 2022-08-20 MED ORDER — INSULIN ASPART 100 UNIT/ML IJ SOLN: 0.0000 [IU] | Freq: Three times a day (TID) | INTRAMUSCULAR | Status: DC

## 2022-08-20 NOTE — ED Notes (Signed)
..ED TO INPATIENT HANDOFF REPORT  ED Nurse Name and Phone #: [phone]  S Name/Age/Gender Rodney Schultz 62 y.o. male Room/Bed: 011C/011C  Code Status   Code Status: Full Code  Home/SNF/Other Home Patient oriented to: self, place, time, and situation Is this baseline? Yes   Triage Complete: Triage complete  Chief Complaint Hyperosmolar hyperglycemic state (HHS) ([place]) [E11.00]  Triage Note Pt came in POV & pt is having difficulty speaking what he is thinking. Pt answered all questions correctly & is A/Ox4 except his inability to get his words. With much questioning PA was able to get pt to answer that he started having this issue 1 week ago, denies Hx of strokes or any pain while in triage.    Allergies Allergies  Allergen Reactions   Influenza Vaccines     Sick- was put in hospital     Level of Care/Admitting Diagnosis ED Disposition     ED Disposition  Admit   Condition  --   [place]: [place] [[id number]]  Level of Care: Progressive [102]  Admit to Progressive based on following criteria: GI, ENDOCRINE disease patients with GI bleeding, acute liver failure or pancreatitis, stable with diabetic ketoacidosis or thyrotoxicosis (hypothyroid) state.  May admit patient to [facility] or [facility] if equivalent level of care is available:: No  Covid Evaluation: Asymptomatic - no recent exposure (last 10 days) testing not required  Diagnosis: Hyperosmolar hyperglycemic state (HHS) [facility]) [[id number]]  Admitting Physician: Velna Ochs [[id number]]  Attending Physician: Velna Ochs [[id number]]  Certification:: I certify this patient will need inpatient services for at least 2 midnights  Estimated Length of Stay: 3          B Medical/Surgery History Past Medical History:  Diagnosis Date   Diabetes mellitus without complication ([place])    GERD (gastroesophageal reflux disease)    Hypertension    Past Surgical History:  Procedure  Laterality Date   RIGHT HEART CATH N/A 08/11/2021   Procedure: RIGHT HEART CATH;  Surgeon: Larey Dresser, MD;  Location: [facility];  Service: Cardiovascular;  Laterality: N/A;   testical  Right      A IV Location/Drains/Wounds Patient Lines/Drains/Airways Status     Active Line/Drains/Airways     Name Placement date Placement time Site Days   Peripheral IV 08/20/22 20 G Anterior;Right;Upper Arm 08/20/22  0143  Arm  less than 1            Intake/Output Last 24 hours  Intake/Output Summary (Last 24 hours) at 08/20/2022 1208 Last data filed at 08/20/2022 0020 Gross per 24 hour  Intake 1918.81 ml  Output --  Net 1918.81 ml    Labs/Imaging Results for orders placed or performed during the hospital encounter of 08/19/22 (from the past 48 hour(s))  Basic metabolic panel     Status: Abnormal   Collection Time: 08/19/22  5:20 PM  Result Value Ref Range   Sodium 129 (L) 135 - 145 mmol/L   Potassium 4.4 3.5 - 5.1 mmol/L   Chloride 92 (L) 98 - 111 mmol/L   CO2 25 22 - 32 mmol/L   Glucose, Bld 966 (HH) 70 - 99 mg/dL    Comment: CRITICAL RESULT CALLED TO, READ BACK BY AND VERIFIED WITH P PULLIAM,RN 1818 08/19/2022 WBOND Glucose reference range applies only to samples taken after fasting for at least 8 hours.    BUN 41 (H) 8 - 23 mg/dL   Creatinine, Ser 2.62 (H)  0.61 - 1.24 mg/dL   Calcium 9.9 8.9 - 15.1 mg/dL   GFR, Estimated 27 (L) >60 mL/min    Comment: (NOTE) Calculated using the CKD-EPI Creatinine Equation (2021)    Anion gap 12 5 - 15    Comment: Performed at [facility], [address] [place], [place] [address]  CBC with Differential     Status: None   Collection Time: 08/19/22  5:20 PM  Result Value Ref Range   WBC 6.8 4.0 - 10.5 K/uL   RBC 5.21 4.22 - 5.81 MIL/uL   Hemoglobin 15.0 13.0 - 17.0 g/dL   HCT 73.7 10.6 - 26.9 %   MCV 84.5 80.0 - 100.0 fL   MCH 28.8 26.0 - 34.0 pg   MCHC 34.1 30.0 - 36.0 g/dL   RDW 48.5 46.2 - 70.3 %   Platelets 226  150 - 400 K/uL   nRBC 0.0 0.0 - 0.2 %   Neutrophils Relative % 57 %   Neutro Abs 3.8 1.7 - 7.7 K/uL   Lymphocytes Relative 35 %   Lymphs Abs 2.4 0.7 - 4.0 K/uL   Monocytes Relative 6 %   Monocytes Absolute 0.4 0.1 - 1.0 K/uL   Eosinophils Relative 2 %   Eosinophils Absolute 0.2 0.0 - 0.5 K/uL   Basophils Relative 0 %   Basophils Absolute 0.0 0.0 - 0.1 K/uL   Immature Granulocytes 0 %   Abs Immature Granulocytes 0.02 0.00 - 0.07 K/uL    Comment: Performed at [facility], [address], [place], [place] [address]  Protime-INR     Status: None   Collection Time: 08/19/22  5:20 PM  Result Value Ref Range   Prothrombin Time 12.2 11.4 - 15.2 seconds   INR 0.9 0.8 - 1.2    Comment: (NOTE) INR goal varies based on device and disease states. Performed at [facility], [address] [place], [place] [address]   Beta-hydroxybutyric acid     Status: Abnormal   Collection Time: 08/19/22  7:30 PM  Result Value Ref Range   Beta-Hydroxybutyric Acid 0.76 (H) 0.05 - 0.27 mmol/L    Comment: Performed at [facility], [address] [place], [place] [address]  Osmolality     Status: Abnormal   Collection Time: 08/19/22  7:38 PM  Result Value Ref Range   Osmolality 347 (HH) 275 - 295 mOsm/kg    Comment: REPEATED TO VERIFY CRITICAL RESULT CALLED TO, READ BACK BY AND VERIFIED WITH: [facility] KOHUT AT 1008 08/20/22 DAS Performed at [facility], [address] [place], [place] [address]   Hepatic function panel     Status: Abnormal   Collection Time: 08/19/22  7:38 PM  Result Value Ref Range   Total Protein 8.4 (H) 6.5 - 8.1 g/dL   Albumin 3.8 3.5 - 5.0 g/dL   AST 24 15 - 41 U/L    Comment: HEMOLYSIS AT THIS LEVEL MAY AFFECT RESULT   ALT 16 0 - 44 U/L    Comment: HEMOLYSIS AT THIS LEVEL MAY AFFECT RESULT   Alkaline Phosphatase 108 38 - 126 U/L   Total Bilirubin 1.0 0.3 - 1.2 mg/dL    Comment: HEMOLYSIS AT THIS LEVEL MAY AFFECT RESULT   Bilirubin, Direct 0.2 0.0  - 0.2 mg/dL   Indirect Bilirubin 0.8 0.3 - 0.9 mg/dL    Comment: Performed at [facility], [address] [place], [place] [address]  Lipase, blood  Status: Abnormal   Collection Time: 08/19/22  7:38 PM  Result Value Ref Range   Lipase 58 (H) 11 - 51 U/L    Comment: Performed at [facility], [address], [place], [place] [address]  CBG monitoring, ED     Status: Abnormal   Collection Time: 08/19/22  7:42 PM  Result Value Ref Range   Glucose-Capillary >600 (HH) 70 - 99 mg/dL    Comment: Glucose reference range applies only to samples taken after fasting for at least 8 hours.  I-Stat venous blood gas, ([facility], [facility], [facility])     Status: Abnormal   Collection Time: 08/19/22  8:20 PM  Result Value Ref Range   pH, Ven 7.309 7.25 - 7.43   pCO2, Ven 57.8 44 - 60 mmHg   pO2, Ven 37 32 - 45 mmHg   Bicarbonate 29.0 (H) 20.0 - 28.0 mmol/L   TCO2 31 22 - 32 mmol/L   O2 Saturation 63 %   Acid-Base Excess 1.0 0.0 - 2.0 mmol/L   Sodium 132 (L) 135 - 145 mmol/L   Potassium 5.0 3.5 - 5.1 mmol/L   Calcium, Ion 1.23 1.15 - 1.40 mmol/L   HCT 45.0 39.0 - 52.0 %   Hemoglobin 15.3 13.0 - 17.0 g/dL   Sample type VENOUS    Comment NOTIFIED PHYSICIAN   CBG monitoring, ED     Status: Abnormal   Collection Time: 08/19/22  8:38 PM  Result Value Ref Range   Glucose-Capillary >600 (HH) 70 - 99 mg/dL    Comment: Glucose reference range applies only to samples taken after fasting for at least 8 hours.  CBG monitoring, ED     Status: Abnormal   Collection Time: 08/19/22  9:24 PM  Result Value Ref Range   Glucose-Capillary >600 (HH) 70 - 99 mg/dL    Comment: Glucose reference range applies only to samples taken after fasting for at least 8 hours.  CBG monitoring, ED     Status: Abnormal   Collection Time: 08/19/22  9:25 PM  Result Value Ref Range   Glucose-Capillary >600 (HH) 70 - 99 mg/dL    Comment: Glucose reference range applies only to samples taken after fasting for at least 8 hours.   Basic metabolic panel     Status: Abnormal   Collection Time: 08/19/22  9:38 PM  Result Value Ref Range   Sodium 137 135 - 145 mmol/L   Potassium 3.9 3.5 - 5.1 mmol/L    Comment: HEMOLYSIS AT THIS LEVEL MAY AFFECT RESULT   Chloride 99 98 - 111 mmol/L   CO2 26 22 - 32 mmol/L   Glucose, Bld 628 (HH) 70 - 99 mg/dL    Comment: CRITICAL RESULT CALLED TO, READ BACK BY AND VERIFIED WITH ISAAC LANE RN 08/19/22 2237 M KOROLESKI Glucose reference range applies only to samples taken after fasting for at least 8 hours.    BUN 40 (H) 8 - 23 mg/dL   Creatinine, Ser 7.32 (H) 0.61 - 1.24 mg/dL   Calcium 20.2 8.9 - 54.2 mg/dL   GFR, Estimated 29 (L) >60 mL/min    Comment: (NOTE) Calculated using the CKD-EPI Creatinine Equation (2021)    Anion gap 12 5 - 15    Comment: Performed at [facility], [address] [place], [place] [address]  CBG monitoring, ED     Status: Abnormal   Collection Time: 08/19/22  9:56 PM  Result Value Ref Range   Glucose-Capillary [facility])  70 - 99 mg/dL    Comment: Glucose reference range applies only to samples taken after fasting for at least 8 hours.   Comment 1 Notify RN    Comment 2 Document in Chart   CBG monitoring, ED     Status: Abnormal   Collection Time: 08/19/22 10:29 PM  Result Value Ref Range   Glucose-Capillary 469 (H) 70 - 99 mg/dL    Comment: Glucose reference range applies only to samples taken after fasting for at least 8 hours.  Urinalysis, Routine w reflex microscopic -Urine, Clean Catch     Status: Abnormal   Collection Time: 08/19/22 10:44 PM  Result Value Ref Range   Color, Urine STRAW (A) YELLOW   APPearance CLEAR CLEAR   Specific Gravity, Urine 1.024 1.005 - 1.030   pH 5.0 5.0 - 8.0   Glucose, UA >=500 (A) NEGATIVE mg/dL   Hgb urine dipstick NEGATIVE NEGATIVE   Bilirubin Urine NEGATIVE NEGATIVE   Ketones, ur NEGATIVE NEGATIVE mg/dL   Protein, ur 540 (A) NEGATIVE mg/dL   Nitrite NEGATIVE NEGATIVE   Leukocytes,Ua NEGATIVE NEGATIVE    RBC / HPF 0-5 0 - 5 RBC/hpf   WBC, UA 0-5 0 - 5 WBC/hpf   Bacteria, UA NONE SEEN NONE SEEN   Squamous Epithelial / HPF 0-5 0 - 5 /HPF    Comment: Performed at [facility], [address], [place], [place] [address]  CBG monitoring, ED     Status: Abnormal   Collection Time: 08/19/22 11:07 PM  Result Value Ref Range   Glucose-Capillary 419 (H) 70 - 99 mg/dL    Comment: Glucose reference range applies only to samples taken after fasting for at least 8 hours.  CBG monitoring, ED     Status: Abnormal   Collection Time: 08/19/22 11:40 PM  Result Value Ref Range   Glucose-Capillary 336 (H) 70 - 99 mg/dL    Comment: Glucose reference range applies only to samples taken after fasting for at least 8 hours.  CBG monitoring, ED     Status: Abnormal   Collection Time: 08/20/22 12:10 AM  Result Value Ref Range   Glucose-Capillary 254 (H) 70 - 99 mg/dL    Comment: Glucose reference range applies only to samples taken after fasting for at least 8 hours.  CBG monitoring, ED     Status: Abnormal   Collection Time: 08/20/22 12:40 AM  Result Value Ref Range   Glucose-Capillary 276 (H) 70 - 99 mg/dL    Comment: Glucose reference range applies only to samples taken after fasting for at least 8 hours.   Comment 1 Notify RN    Comment 2 Document in Chart   CBG monitoring, ED     Status: Abnormal   Collection Time: 08/20/22  1:12 AM  Result Value Ref Range   Glucose-Capillary 198 (H) 70 - 99 mg/dL    Comment: Glucose reference range applies only to samples taken after fasting for at least 8 hours.  HIV Antibody (routine testing w rflx)     Status: None   Collection Time: 08/20/22  2:00 AM  Result Value Ref Range   HIV Screen 4th Generation wRfx Non Reactive Non Reactive    Comment: Performed at [facility], [address], [place], [place] [address]  Basic metabolic panel     Status: Abnormal   Collection Time: 08/20/22  2:00 AM  Result Value Ref Range   Sodium 141 135 - 145 mmol/L    Potassium  3.1 (L) 3.5 - 5.1 mmol/L   Chloride 103 98 - 111 mmol/L   CO2 28 22 - 32 mmol/L   Glucose, Bld 218 (H) 70 - 99 mg/dL    Comment: Glucose reference range applies only to samples taken after fasting for at least 8 hours.   BUN 34 (H) 8 - 23 mg/dL   Creatinine, Ser 2.30 (H) 0.61 - 1.24 mg/dL   Calcium 10.3 8.9 - 10.3 mg/dL   GFR, Estimated 31 (L) >60 mL/min    Comment: (NOTE) Calculated using the CKD-EPI Creatinine Equation (2021)    Anion gap 10 5 - 15    Comment: Performed at [facility] [address] [place] [address]  CBC     Status: None   Collection Time: 08/20/22  2:00 AM  Result Value Ref Range   WBC 7.9 4.0 - 10.5 K/uL   RBC 5.04 4.22 - 5.81 MIL/uL   Hemoglobin 14.8 13.0 - 17.0 g/dL   HCT 41.5 39.0 - 52.0 %   MCV 82.3 80.0 - 100.0 fL   MCH 29.4 26.0 - 34.0 pg   MCHC 35.7 30.0 - 36.0 g/dL   RDW 11.7 11.5 - 15.5 %   Platelets 224 150 - 400 K/uL   nRBC 0.0 0.0 - 0.2 %    Comment: Performed at [facility], [place] [address] [place], [place] [address]  CBG monitoring, ED     Status: Abnormal   Collection Time: 08/20/22  2:15 AM  Result Value Ref Range   Glucose-Capillary 183 (H) 70 - 99 mg/dL    Comment: Glucose reference range applies only to samples taken after fasting for at least 8 hours.  CBG monitoring, ED     Status: Abnormal   Collection Time: 08/20/22  3:13 AM  Result Value Ref Range   Glucose-Capillary 159 (H) 70 - 99 mg/dL    Comment: Glucose reference range applies only to samples taken after fasting for at least 8 hours.  CBG monitoring, ED     Status: Abnormal   Collection Time: 08/20/22  4:21 AM  Result Value Ref Range   Glucose-Capillary 200 (H) 70 - 99 mg/dL    Comment: Glucose reference range applies only to samples taken after fasting for at least 8 hours.  Potassium     Status: None   Collection Time: 08/20/22  4:42 AM  Result Value Ref Range   Potassium 4.4 3.5 - 5.1 mmol/L    Comment: Performed at [facility], [place] [address] [place], [place] [address]  CBG monitoring, ED     Status: Abnormal   Collection Time: 08/20/22  5:23 AM  Result Value Ref Range   Glucose-Capillary 229 (H) 70 - 99 mg/dL    Comment: Glucose reference range applies only to samples taken after fasting for at least 8 hours.  CBG monitoring, ED     Status: Abnormal   Collection Time: 08/20/22  6:18 AM  Result Value Ref Range   Glucose-Capillary 245 (H) 70 - 99 mg/dL    Comment: Glucose reference range applies only to samples taken after fasting for at least 8 hours.  CBG monitoring, ED     Status: Abnormal   Collection Time: 08/20/22  9:08 AM  Result Value Ref Range   Glucose-Capillary 303 (H) 70 - 99 mg/dL    Comment: Glucose reference range applies only to samples taken after fasting for at least 8 hours.   CT Head Wo  Contrast  Result Date: 08/19/2022 CLINICAL DATA:  Mental status change, difficulty speaking. EXAM: CT HEAD WITHOUT CONTRAST TECHNIQUE: Contiguous axial images were obtained from the base of the skull through the vertex without intravenous contrast. RADIATION DOSE REDUCTION: This exam was performed according to the departmental dose-optimization program which includes automated exposure control, adjustment of the mA and/or kV according to patient size and/or use of iterative reconstruction technique. COMPARISON:  Head CT 11/03/2021 FINDINGS: Brain: No evidence of acute infarction, hemorrhage, hydrocephalus, extra-axial collection or mass lesion/mass effect. Vascular: Atherosclerotic calcifications are present within the cavernous internal carotid arteries. Skull: Normal. Negative for fracture or focal lesion. Sinuses/Orbits: No acute finding. Other: None. IMPRESSION: No acute intracranial abnormality. Electronically Signed   By: Ronney Asters M.D.   On: 08/19/2022 17:58    Pending Labs Unresulted Labs (From admission, onward)     Start     Ordered   08/19/22 2137  Hemoglobin A1c  Once,   R         08/19/22 2136            Vitals/Pain Today's Vitals   08/20/22 0218 08/20/22 0300 08/20/22 0600 08/20/22 0634  BP:  126/89 128/87   Pulse:  (!) 49 61   Resp:  12 13   Temp: 98.4 F (36.9 C)   98.6 F (37 C)  TempSrc: Oral   Oral  SpO2:  100% 100%   Weight:      Height:      PainSc:        Isolation Precautions No active isolations  Medications Medications  dextrose 50 % solution 0-50 mL (has no administration in time range)  enoxaparin (LOVENOX) injection 40 mg (has no administration in time range)  insulin aspart (novoLOG) injection 0-15 Units (11 Units Subcutaneous Given 08/20/22 1046)  polyethylene glycol (MIRALAX / GLYCOLAX) packet 17 g (has no administration in time range)  senna-docusate (Senokot-S) tablet 1 tablet (has no administration in time range)  lactated ringers bolus 1,452 mL (0 mLs Intravenous Stopped 08/19/22 2322)  potassium chloride 10 mEq in 100 mL IVPB (0 mEq Intravenous Stopped 08/19/22 2309)  potassium chloride (KLOR-CON) packet 40 mEq (40 mEq Oral Given 08/20/22 0340)  insulin glargine-yfgn (SEMGLEE) injection 15 Units (15 Units Subcutaneous Given 08/20/22 1000)    Mobility walks     Focused Assessments Cardiac Assessment Handoff:    No results found for: "CKTOTAL", "CKMB", "CKMBINDEX", "TROPONINI" No results found for: "DDIMER" Does the Patient currently have chest pain? No   , Neuro Assessment Handoff:  Swallow screen pass? Yes          Neuro Assessment: Within Defined Limits Neuro Checks:      Has TPA been given? No If patient is a Neuro Trauma and patient is going to OR before floor call report to [place] nurse: [phone] or [phone]   R Recommendations: See Admitting Provider Note  Report given to:   Additional Notes: na

## 2022-08-20 NOTE — Inpatient Diabetes Management (Addendum)
Inpatient Diabetes Program Recommendations  AACE/ADA: New Consensus Statement on Inpatient Glycemic Control (2015)  Target Ranges:  Prepandial:   less than 140 mg/dL      Peak postprandial:   less than 180 mg/dL (1-2 hours)      Critically ill patients:  140 - 180 mg/dL   Lab Results  Component Value Date   GLUCAP 303 (H) 08/20/2022   HGBA1C 9.3 (H) 05/13/2022    Latest Reference Range & Units 08/20/22 02:00 08/20/22 04:42  Potassium 3.5 - 5.1 mmol/L 3.1 (L) 4.4  (L): Data is abnormally low  Diabetes history: DM2 Outpatient Diabetes medications: Lantus 15 units qd, Glucotrol 10 mg qd, Jardiance 10 mg qd, Metformin 500 mg bid Current orders for Inpatient glycemic control: IV insulin drip then Semglee 15 units x 1 time (give 2 hrs. Prior to D/C of insulin drip , Novolog correction 0-15 q 4 hrs.-cover when IV insulin drip discontinued  Inpatient Diabetes Program Recommendations:   Noted patient has been eating and drinking less prior to admission and missing medications.  Will follow while inpatient and attempt to speak with patient when appropriate clinically.  Noted patient's IV drip discontinued prior to basal insulin given, so CBGs elevated 303 @ 0908.   Lantus 15 units given x 1. -Add Lantus 15 units qd. -Add Novolog 3 units tid meal coverage if eats 50% meal while Diabetes oral meds held.  Thank you, Nani Gasser. Yohann Curl, RN, MSN, CDE  Diabetes Coordinator Inpatient Glycemic Control Team Team Pager [phone] (8am-5pm) 08/20/2022 10:11 AM

## 2022-08-20 NOTE — ED Notes (Addendum)
Pt hr dropping to lowest 55, MD Masters notified. EKG ordered and complete. No new orders at this time

## 2022-08-20 NOTE — ED Notes (Signed)
MD aware and notified of critical lab value.

## 2022-08-20 NOTE — Consult Note (Signed)
Neurology Consultation Reason for Consult: Stroke on MRI  Requesting Physician: Christiana Fuchs  CC: AMS  History is obtained from: Patient and chart review   HPI: Rodney Schultz is a 62 y.o. male with a past medical history significant for poorly controlled type 2 diabetes (A1c 15 in May 2023 improving to 9 in October 2023), poorly controlled hypertension, hyperlipidemia, CKD stage III, former smoking (quit February 2023), coronary artery disease, heart failure with reduced EF (35% in August 2023 as low as 20% in November 2022), mild OSA on 08/2021 sleep study, left renal mass (likely cyst based on ultrasound)  Per primary team's collateral from patient's girlfriend Truddie Crumble, the patient has had worsening memory for 3 weeks during which time he has been drinking more soda has decreased appetite and increased urination and concern for medication nonadherence.  Patient reports to me that he feels that his problems are secondary to [facility] (on clarification he is talking about insulin and he confirms this is Lantus which was substituted for his prior insulin due to cost concerns).  He feels that this led to his sugars being dysregulated.  There is additionally concern for weight loss which primary team is working up and they note he presented with HHS and significant speech impairment.  Patient endorses he was having trouble with his speech but feels he is back to his baseline now and is unwilling to consider that he has had a stroke  LKW: 08/17/2022 Thrombolytic given?: No, out of the window  IA performed?: No, out of the window Premorbid modified rankin scale: 0  05/04/2022: "Today he returns for HF follow up. Overall feeling fine. Walks 4 days a week for an hour at each time for exercise. He is able to push mow his yard without dyspnea. Denies palpitations, CP, dizziness, edema, or PND/Orthopnea. Appetite ok. No fever or chills. Weight at home 190 pounds. Taking all medications. Blood sugars 120-125.  "   ROS: Unable to obtain due to altered mental status.   Past Medical History:  Diagnosis Date   Diabetes mellitus without complication ([place])    GERD (gastroesophageal reflux disease)    Hypertension    Past Surgical History:  Procedure Laterality Date   RIGHT HEART CATH N/A 08/11/2021   Procedure: RIGHT HEART CATH;  Surgeon: Larey Dresser, MD;  Location: [place];  Service: Cardiovascular;  Laterality: N/A;   testical  Right    Current Outpatient Medications  Medication Instructions   aspirin (ASPIRIN CHILDRENS) 81 mg, Oral, Daily   Blood Glucose Monitoring Suppl (ACCU-CHEK GUIDE) w/Device KIT Use as directed twice daily.   carvedilol (COREG) 12.5 mg, Oral, 2 times daily with meals   glipiZIDE (GLUCOTROL) 10 mg, Oral, Daily before breakfast   glucose blood test strip Use twice daily to check blood sugars   hydrALAZINE (APRESOLINE) 50 mg, Oral, 3 times daily   Insulin Pen Needle (UNIFINE PENTIPS) 32G X 4 MM MISC Use daily with insulin   isosorbide mononitrate (IMDUR) 30 mg, Oral, Daily   Jardiance 10 mg, Oral, Daily before breakfast   Lancets Micro Thin 33G MISC Use to check blood sugar twice daily   Lantus SoloStar 15 Units, Subcutaneous, Daily   metFORMIN (GLUCOPHAGE-XR) 500 mg, Oral, 2 times daily   Repatha SureClick 937 mg, Subcutaneous, Every 14 days   rosuvastatin (CRESTOR) 40 mg, Oral, Daily   sacubitril-valsartan (ENTRESTO) 97-103 MG 1 tablet, Oral, 2 times daily   spironolactone (ALDACTONE) 12.5 mg, Oral, Daily   triamcinolone cream (  KENALOG) 0.1 % 1 Application, Topical, 2 times daily   Vascepa 2 g, Oral, 2 times daily     Family History  Problem Relation Age of Onset   Healthy Mother     Social History:  reports that he has been smoking cigarettes. He started smoking about a year ago. He has a 3.50 pack-year smoking history. He has never used smokeless tobacco. He reports that he does not currently use alcohol. He reports that he does not use  drugs.  Reports he has quit smoking  Exam: Current vital signs: BP 110/76   Pulse 61   Temp 98.6 F (37 C) (Oral)   Resp 18   Ht 5\' 10"  (1.778 m)   Wt 72.6 kg   SpO2 100%   BMI 22.96 kg/m  Vital signs in last 24 hours: Temp:  [98.1 F (36.7 C)-98.6 F (37 C)] 98.6 F (37 C) (02/01 0634) Pulse Rate:  [42-65] 61 (02/01 1200) Resp:  [0-19] 18 (02/01 1200) BP: (110-159)/(68-89) 110/76 (02/01 1200) SpO2:  [100 %] 100 % (02/01 1200)   Physical Exam  Constitutional: Appears thin but well-developed and well-nourished.  Psych: Affect somewhat guarded but cooperative Eyes: No scleral injection HENT: No oropharyngeal obstruction.  MSK: no joint deformities.  Cardiovascular: Normal rate and regular rhythm. Perfusing extremities well Respiratory: Effort normal, non-labored breathing GI: Soft.  No distension. There is no tenderness.  Skin: Warm dry and intact visible skin  Neuro: Mental Status: Patient is awake, alert, oriented to person, place, month, year, and situation. Mild aphasia, for example calls cactus porcupine, because hammock a swing and has difficulty naming his medications, repeatedly calling Lantus Latuda, with some dysfluency to his speech Cranial Nerves: II: Visual Fields are full. Pupils are equal, round, and reactive to light.   III,IV, VI: EOMI without ptosis or diploplia.  V: Facial sensation is symmetric to temperature VII: Facial movement is symmetric.  VIII: hearing is intact to voice X: Uvula elevates symmetrically XI: Shoulder shrug is symmetric. XII: tongue is midline without atrophy or fasciculations.  Motor: Tone is normal. Bulk is normal. 5/5 strength was present in all four extremities.  Sensory: Sensation is symmetric to light touch and temperature in the arms and legs. Deep Tendon Reflexes: 2+ and symmetric in the brachioradialis and patellae.  Cerebellar: FNF and HKS are intact bilaterally Gait:  Deferred   NIHSS total 1 Score  breakdown: mild aphasia Performed at 3 AM   I have reviewed labs in epic and the results pertinent to this consultation are:  Basic Metabolic Panel: Recent Labs  Lab 08/19/22 1720 08/19/22 2020 08/19/22 2138 08/20/22 0200 08/20/22 0442  NA 129* 132* 137 141  --   K 4.4 5.0 3.9 3.1* 4.4  CL 92*  --  99 103  --   CO2 25  --  26 28  --   GLUCOSE 966*  --  628* 218*  --   BUN 41*  --  40* 34*  --   CREATININE 2.62*  --  2.42* 2.30*  --   CALCIUM 9.9  --  10.0 10.3  --     CBC: Recent Labs  Lab 08/19/22 1720 08/19/22 2020 08/20/22 0200  WBC 6.8  --  7.9  NEUTROABS 3.8  --   --   HGB 15.0 15.3 14.8  HCT 44.0 45.0 41.5  MCV 84.5  --  82.3  PLT 226  --  224    Coagulation Studies: Recent Labs    08/19/22  1720  LABPROT 12.2  INR 0.9    Lab Results  Component Value Date   CHOL 191 05/04/2022   HDL 28 (L) 05/04/2022   LDLCALC 107 (H) 05/04/2022   LDLDIRECT 122.9 (H) 11/03/2021   TRIG 280 (H) 05/04/2022   CHOLHDL 6.8 05/04/2022   Lab Results  Component Value Date   HGBA1C 9.3 (H) 05/13/2022    Unresulted Labs (From admission, onward)     Start     Ordered   08/21/22 9326  Basic metabolic panel  Tomorrow morning,   R        08/20/22 1515   08/20/22 1904  Lipid panel  Once,   R        08/20/22 1905   08/19/22 2137  Hemoglobin A1c  Once,   R        08/19/22 2136             I have reviewed the images obtained:  MRA  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 Otherwise normal intracranial MRA.  MRI brain personally reviewed, agree with radiology:   Small acute perforator infarct in the left basal ganglia with additional small acute infarcts in the left frontal and parietal white matter. Small amount of petechial hemorrhage within the perforator infarct.  Impression: Left MCA territory embolic appearing strokes, etiology atheroembolic in the setting of his severe left MCA stenosis versus cardioembolic in the setting of his known significant cardiac  disease.  Given his fluctuating speech symptoms, query whether he has been having symptomatic hypoperfusion from his left MCA stenosis in the setting of diuresis/hypovolemia due to uncontrolled diabetes.  He does seem fixated on Latuda (Lantus) causing his presentation and hesitant to consider the diagnosis of stroke, also noting that he thinks he is on too much cholesterol medication.  Mistrust of the medical system may be playing a role from the social determinants of health perspective.  Appreciate primary team's care and attention to this complex patient  Recommendations: # Left MCA territory embolic appearing strokes - Stroke labs HgbA1c, fasting lipid panel  -Goal LDL per cardiology less than 55  -Goal A1c less than 7% from a neurological perspective, appreciate cardiology recommendations if stricter - MRA of the brain without contrast (completed as above) and Carotid duplex (pending) due to renal function - Frequent neuro checks - Echocardiogram pending -Continue home aspirin 81 mg daily (300 mg daily rectal if unable to take p.o.) - If no indication for anticoagulation on echocardiogram/telemetry, would proceed with Plavix 300 mg load with 75 mg daily for 90 day course due to severe left MCA stenosis, if patient is amenable to adding this medication - Risk factor modification - Telemetry monitoring - Blood pressure goal   - Gradual normotension given out of the permissive hypertension window - PT consult, OT consult, Speech consult - Stroke team to follow  [place] [phone] Available 7 PM to 7 AM, outside of these hours please call Neurologist on call as listed on Amion.

## 2022-08-20 NOTE — ED Notes (Signed)
Endotool, insulin drip and LR plus dextrose paused at this time per MD Masters. Will restart when blood sugar over 300. No other orders placed at this time.

## 2022-08-20 NOTE — Progress Notes (Signed)
NAME:  Rodney Schultz, MRN:  [id number], DOB:  1961/02/27, LOS: 1 ADMISSION DATE:  08/19/2022  Subjective  Patient evaluated at bedside this AM. Expressed difficulty recalling the events leading up to his admission, understands he is diabetic and reports he is seen by a PCP for management of his insulin. He identifies elevated glucose as the reason he came to the hospital, states "I started hallucinating". He endorses regularly monitoring his glucose but is unable to verbalize/recall any recent values form home. Also endorses adherence to home medications. Reports persistently diminished appetite, reports this has not improved while being here.   Overall he is alert, awake, and cooperative on evaluation. Fluency, comprehension, and naming are all normal. Denies any pain or focal weakness.   Objective   Blood pressure 128/87, pulse 61, temperature 98.6 F (37 C), temperature source Oral, resp. rate 13, height 5\' 10"  (1.778 m), weight 72.6 kg, SpO2 100 %.     Intake/Output Summary (Last 24 hours) at 08/20/2022 1015 Last data filed at 08/20/2022 0020 Gross per 24 hour  Intake 1918.81 ml  Output --  Net 1918.81 ml   Filed Weights   08/19/22 1713  Weight: 72.6 kg   ROS Negative except as mentioned in subjective.   Physical Exam General: No acute distress. Awake and conversant. Eyes: Normal conjunctiva, anicteric. Round symmetric pupils. ENT: Hearing grossly intact. No nasal discharge. Neck: Neck is supple. No masses or thyromegaly. Respiratory: Respirations are non-labored. No wheezing. Skin: Warm. No rashes or ulcers. Psych: Alert and oriented. Cooperative, Appropriate mood and affect, Normal judgment. CV: No lower extremity edema. MSK: Normal ambulation. No clubbing or cyanosis. Neuro: Mental Status AAOx3 Language: Fluency, Comprehension, Naming are all intact CN I-XII are all grossly normal Motor: 5/5 strength in BUE and BLE Sensation: grossly intact    Labs       Latest  Ref Rng & Units 08/20/2022    2:00 AM 08/19/2022    8:20 PM 08/19/2022    5:20 PM  CBC  WBC 4.0 - 10.5 K/uL 7.9   6.8   Hemoglobin 13.0 - 17.0 g/dL 14.8  15.3  15.0   Hematocrit 39.0 - 52.0 % 41.5  45.0  44.0   Platelets 150 - 400 K/uL 224   226       Latest Ref Rng & Units 08/20/2022    4:42 AM 08/20/2022    2:00 AM 08/19/2022    9:38 PM  BMP  Glucose 70 - 99 mg/dL  218  628   BUN 8 - 23 mg/dL  34  40   Creatinine 0.61 - 1.24 mg/dL  2.30  2.42   Sodium 135 - 145 mmol/L  141  137   Potassium 3.5 - 5.1 mmol/L 4.4  3.1  3.9   Chloride 98 - 111 mmol/L  103  99   CO2 22 - 32 mmol/L  28  26   Calcium 8.9 - 10.3 mg/dL  10.3  10.0     Summary  Rodney Schultz is a 62 yo male with past medical history significant for T2DM (A1C9.3%, 10/23), HTN, HLD, CAD, and HFrEF (LVEF ~42% 11/23) who presents to [facility] on 08/20/2022 for concerns of AMS and was found to be in HHS with an initial blood glucose of 966. Although his glucose has improved since admission, mentation does not appear to have returned to baseline, per description provided by patient's partner. Neurological exam is largely unremarkable for any focal deficits, but there is some delay  in responses and recalling.  Assessment & Plan:  Principal Problem:   Hyperosmolar hyperglycemic state (HHS) ([place])  #Acute metabolic encephalopathy On evaluation patient has some delay recalling events leading to his admission. Paucity of speech is appreciated, otherwise neurological exam is negative. CT head on admission was unremarkable. Head MRI was order to definitively rule out any intracranial process.  -Pending head MRI w/o contrast  #HHS vs DKA #T2DM CBG's have trended down since admission, they continue to range between 250-300s today. Received Semglee 15 u today. We will continue to trend glucose and adjust long acting insulin accordingly for tomorrow.Exact cause of HHS exacerbation remains unclear, likely suspect medication non-adherence.Infection  seems less likely as vitals are stable and labs are largely unremarkable.  - mSSI - Repeat A1C pending - Continue to monitor CBGs - Appreciate diabetic coordinator recommendations   #AKI on CKD stage 3b Scr 2.42>2.3, GFR 29>31. Likely improved with fluids as HHS resolves. Remains on Dextrose 50% PRN.  -trend BMP -strict I and Os -avoid nephrotoxic agents  -IVFs   #HFrEF  #CAD #Mixed ischemic/nonischemic cardiomyopathy Stable. Most recent LVEF 42% (11/23). EKG shows no acute changes. No increased oxygen requirements or signs of LE edema appreciated on exam.  -In the setting of AKI, hold entresto, Empagliflozin, and spironolactone  -Hold home other home meds in setting of altered mental status.  -Strict I' and O's                  -Daily weights    #HTN VSS. Home medications held:  Entresto 97-103 mg BID, spironolactone 12.5 mg qd, hydral 50 mg TID, co-reg 12.5 mg BID. - Will continue to hold home medications. -Monitor blood pressure    Best practice:  DIET: Diabetic Diet IVF: D5LR,125cc/hr  DVT PPX: Lovenox 40 mg SQ BOWEL: Miralax & Senokot PRN CODE: Full  Dispo: Pending clinical improvement  Christene Slates, MD Internal Medicine Resident PGY-1 PAGER: [phone] 08/20/2022 10:15 AM  If after hours (below), please contact on-call pager: [phone] 5PM-7AM Monday-Friday 1PM-7AM Saturday-Sunday

## 2022-08-21 ENCOUNTER — Inpatient Hospital Stay (HOSPITAL_COMMUNITY): Payer: Medicaid Other

## 2022-08-21 ENCOUNTER — Other Ambulatory Visit (HOSPITAL_COMMUNITY): Payer: Self-pay

## 2022-08-21 DIAGNOSIS — E1100 Type 2 diabetes mellitus with hyperosmolarity without nonketotic hyperglycemic-hyperosmolar coma (NKHHC): Secondary | ICD-10-CM | POA: Diagnosis not present

## 2022-08-21 DIAGNOSIS — I6389 Other cerebral infarction: Secondary | ICD-10-CM | POA: Diagnosis not present

## 2022-08-21 DIAGNOSIS — Z7984 Long term (current) use of oral hypoglycemic drugs: Secondary | ICD-10-CM | POA: Diagnosis not present

## 2022-08-21 DIAGNOSIS — G40109 Localization-related (focal) (partial) symptomatic epilepsy and epileptic syndromes with simple partial seizures, not intractable, without status epilepticus: Secondary | ICD-10-CM

## 2022-08-21 DIAGNOSIS — I639 Cerebral infarction, unspecified: Secondary | ICD-10-CM

## 2022-08-21 LAB — BASIC METABOLIC PANEL
Anion gap: 12 (ref 5–15)
Anion gap: 13 (ref 5–15)
BUN: 30 mg/dL — ABNORMAL HIGH (ref 8–23)
BUN: 37 mg/dL — ABNORMAL HIGH (ref 8–23)
CO2: 21 mmol/L — ABNORMAL LOW (ref 22–32)
CO2: 22 mmol/L (ref 22–32)
Calcium: 9.3 mg/dL (ref 8.9–10.3)
Calcium: 9.2 mg/dL (ref 8.9–10.3)
Chloride: 105 mmol/L (ref 98–111)
Chloride: 99 mmol/L (ref 98–111)
Creatinine, Ser: 2.08 mg/dL — ABNORMAL HIGH (ref 0.61–1.24)
Creatinine, Ser: 2.19 mg/dL — ABNORMAL HIGH (ref 0.61–1.24)
GFR, Estimated: 35 mL/min — ABNORMAL LOW (ref >60)
GFR, Estimated: 33 mL/min — ABNORMAL LOW (ref >60)
Glucose, Bld: 260 mg/dL — ABNORMAL HIGH (ref 70–99)
Glucose, Bld: 502 mg/dL (ref 70–99)
Potassium: 4.1 mmol/L (ref 3.5–5.1)
Potassium: 4.9 mmol/L (ref 3.5–5.1)
Sodium: 138 mmol/L (ref 135–145)
Sodium: 134 mmol/L — ABNORMAL LOW (ref 135–145)

## 2022-08-21 LAB — GLUCOSE, CAPILLARY
Glucose-Capillary: 176 mg/dL — ABNORMAL HIGH (ref 70–99)
Glucose-Capillary: 203 mg/dL — ABNORMAL HIGH (ref 70–99)
Glucose-Capillary: 225 mg/dL — ABNORMAL HIGH (ref 70–99)
Glucose-Capillary: 227 mg/dL — ABNORMAL HIGH (ref 70–99)
Glucose-Capillary: 279 mg/dL — ABNORMAL HIGH (ref 70–99)
Glucose-Capillary: 296 mg/dL — ABNORMAL HIGH (ref 70–99)
Glucose-Capillary: 368 mg/dL — ABNORMAL HIGH (ref 70–99)
Glucose-Capillary: 381 mg/dL — ABNORMAL HIGH (ref 70–99)
Glucose-Capillary: 422 mg/dL — ABNORMAL HIGH (ref 70–99)
Glucose-Capillary: 448 mg/dL — ABNORMAL HIGH (ref 70–99)
Glucose-Capillary: 507 mg/dL (ref 70–99)

## 2022-08-21 LAB — HEMOGLOBIN A1C
Hgb A1c MFr Bld: >15.5 % — ABNORMAL HIGH (ref 4.8–5.6)
Mean Plasma Glucose: >398 mg/dL

## 2022-08-21 LAB — LIPID PANEL
Cholesterol: 231 mg/dL — ABNORMAL HIGH (ref 0–200)
HDL: 24 mg/dL — ABNORMAL LOW (ref >40)
LDL Cholesterol: UNDETERMINED mg/dL (ref 0–99)
Total CHOL/HDL Ratio: 9.6 RATIO
Triglycerides: 514 mg/dL — ABNORMAL HIGH (ref <150)
VLDL: UNDETERMINED mg/dL (ref 0–40)

## 2022-08-21 LAB — ECHOCARDIOGRAM COMPLETE
Area-P 1/2: 2.04 cm2
Calc EF: 56.5 %
Height: 70 in
S' Lateral: 3.75 cm
Single Plane A2C EF: 59.9 %
Single Plane A4C EF: 53.7 %
Weight: 2921.6 oz

## 2022-08-21 LAB — MAGNESIUM
Magnesium: 2 mg/dL (ref 1.7–2.4)
Magnesium: 2.1 mg/dL (ref 1.7–2.4)

## 2022-08-21 LAB — LDL CHOLESTEROL, DIRECT: Direct LDL: 73 mg/dL (ref 0–99)

## 2022-08-21 LAB — GLUCOSE, RANDOM: Glucose, Bld: 274 mg/dL — ABNORMAL HIGH (ref 70–99)

## 2022-08-21 LAB — POTASSIUM: Potassium: 4 mmol/L (ref 3.5–5.1)

## 2022-08-21 LAB — PHOSPHORUS: Phosphorus: 4.1 mg/dL (ref 2.5–4.6)

## 2022-08-21 MED ORDER — ATORVASTATIN CALCIUM 80 MG PO TABS
80.0000 mg | ORAL_TABLET | Freq: Every day | ORAL | Status: DC
  Administered 2022-08-21 – 2022-08-23 (×3): 80 mg via ORAL
  Filled 2022-08-21 (×3): qty 1

## 2022-08-21 MED ORDER — INSULIN GLARGINE-YFGN 100 UNIT/ML ~~LOC~~ SOLN
20.0000 [IU] | Freq: Once | SUBCUTANEOUS | Status: AC
  Administered 2022-08-21: 20 [IU] via SUBCUTANEOUS
  Filled 2022-08-21: qty 0.2

## 2022-08-21 MED ORDER — INSULIN GLARGINE-YFGN 100 UNIT/ML ~~LOC~~ SOLN
10.0000 [IU] | Freq: Once | SUBCUTANEOUS | Status: AC
  Administered 2022-08-21: 10 [IU] via SUBCUTANEOUS
  Filled 2022-08-21: qty 0.1

## 2022-08-21 MED ORDER — INSULIN ASPART 100 UNIT/ML IJ SOLN
10.0000 [IU] | Freq: Once | INTRAMUSCULAR | Status: AC
  Administered 2022-08-21: 10 [IU] via SUBCUTANEOUS

## 2022-08-21 MED ORDER — CLOPIDOGREL BISULFATE 75 MG PO TABS: 75.0000 mg | ORAL_TABLET | Freq: Every day | ORAL | Status: DC

## 2022-08-21 MED ORDER — ASPIRIN 81 MG PO TBEC
81.0000 mg | DELAYED_RELEASE_TABLET | Freq: Every day | ORAL | Status: DC
  Administered 2022-08-21 – 2022-08-23 (×3): 81 mg via ORAL
  Filled 2022-08-21 (×3): qty 1

## 2022-08-21 MED ORDER — CLOPIDOGREL BISULFATE 75 MG PO TABS
300.0000 mg | ORAL_TABLET | Freq: Once | ORAL | Status: AC
  Administered 2022-08-21: 300 mg via ORAL
  Filled 2022-08-21: qty 4

## 2022-08-21 MED ORDER — APIXABAN 2.5 MG PO TABS: 2.5000 mg | ORAL_TABLET | Freq: Two times a day (BID) | ORAL | Status: DC

## 2022-08-21 MED ORDER — INSULIN GLARGINE-YFGN 100 UNIT/ML ~~LOC~~ SOLN
20.0000 [IU] | Freq: Every day | SUBCUTANEOUS | Status: DC
  Filled 2022-08-21: qty 0.2

## 2022-08-21 MED ORDER — INSULIN ASPART 100 UNIT/ML IJ SOLN
0.0000 [IU] | Freq: Three times a day (TID) | INTRAMUSCULAR | Status: DC
  Administered 2022-08-21: 9 [IU] via SUBCUTANEOUS
  Administered 2022-08-21: 5 [IU] via SUBCUTANEOUS
  Administered 2022-08-21 – 2022-08-22 (×2): 9 [IU] via SUBCUTANEOUS

## 2022-08-21 MED ORDER — LEVETIRACETAM 250 MG PO TABS
250.0000 mg | ORAL_TABLET | Freq: Two times a day (BID) | ORAL | Status: DC
  Administered 2022-08-21 – 2022-08-23 (×4): 250 mg via ORAL
  Filled 2022-08-21 (×5): qty 1

## 2022-08-21 MED ORDER — APIXABAN 5 MG PO TABS
5.0000 mg | ORAL_TABLET | Freq: Two times a day (BID) | ORAL | Status: DC
  Administered 2022-08-22 – 2022-08-23 (×3): 5 mg via ORAL
  Filled 2022-08-21 (×3): qty 1

## 2022-08-21 MED ORDER — HYDRALAZINE HCL 50 MG PO TABS
50.0000 mg | ORAL_TABLET | Freq: Three times a day (TID) | ORAL | Status: DC
  Administered 2022-08-21 – 2022-08-23 (×7): 50 mg via ORAL
  Filled 2022-08-21 (×7): qty 1

## 2022-08-21 NOTE — Evaluation (Signed)
Physical Therapy Evaluation Patient Details Name: Rodney Schultz MRN: [id number] DOB: 16-Mar-1961 Today's Date: 08/21/2022  History of Present Illness  Pt is a 62 y/o male presenting on 1/31 with AMS. Found to be in [facility] and admitted for further workup. 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 PMH includes: DM, HTN.   Clinical Impression  PT eval complete. Pt is independent with all functional mobility. No further skilled PT intervention indicated. PT signing off.       Recommendations for follow up therapy are one component of a multi-disciplinary discharge planning process, led by the attending physician.  Recommendations may be updated based on patient status, additional functional criteria and insurance authorization.  Follow Up Recommendations No PT follow up      Assistance Recommended at Discharge None  Patient can return home with the following       Equipment Recommendations None recommended by PT  Recommendations for Other Services       Functional Status Assessment Patient has not had a recent decline in their functional status     Precautions / Restrictions Precautions Precautions: None      Mobility  Bed Mobility Overal bed mobility: Independent                  Transfers Overall transfer level: Independent Equipment used: None                    Ambulation/Gait Ambulation/Gait assistance: Independent Gait Distance (Feet): 300 Feet Assistive device: None Gait Pattern/deviations: WFL(Within Functional Limits)   Gait velocity interpretation: >2.62 ft/sec, indicative of community ambulatory      Stairs            Wheelchair Mobility    Modified Rankin (Stroke Patients Only) Modified Rankin (Stroke Patients Only) Pre-Morbid Rankin Score: No symptoms Modified Rankin: No significant disability     Balance Overall balance assessment: No apparent balance deficits (not formally assessed)                                            Pertinent Vitals/Pain Pain Assessment Pain Assessment: No/denies pain    Home Living Family/patient expects to be discharged to:: Private residence Living Arrangements: Spouse/significant other Available Help at Discharge: Family;Available 24 hours/day Type of Home: House Home Access: Level entry       Home Layout: One level Home Equipment: None      Prior Function Prior Level of Function : Independent/Modified Independent;Driving (occasionally drives)             Mobility Comments: no AD. Able to walk community distances       Hand Dominance        Extremity/Trunk Assessment   Upper Extremity Assessment Upper Extremity Assessment: Overall WFL for tasks assessed    Lower Extremity Assessment Lower Extremity Assessment: Overall WFL for tasks assessed (symmetrical)    Cervical / Trunk Assessment Cervical / Trunk Assessment: Normal  Communication   Communication: No difficulties  Cognition Arousal/Alertness: Awake/alert Behavior During Therapy: WFL for tasks assessed/performed Overall Cognitive Status: Within Functional Limits for tasks assessed                                          General Comments General  comments (skin integrity, edema, etc.): VSS on RA    Exercises     Assessment/Plan    PT Assessment Patient does not need any further PT services  PT Problem List         PT Treatment Interventions      PT Goals (Current goals can be found in the Care Plan section)  Acute Rehab PT Goals Patient Stated Goal: home PT Goal Formulation: All assessment and education complete, DC therapy    Frequency       Co-evaluation               AM-PAC PT "6 Clicks" Mobility  Outcome Measure Help needed turning from your back to your side while in a flat bed without using bedrails?: None Help needed moving from lying on your back to sitting on the side of a flat bed  without using bedrails?: None Help needed moving to and from a bed to a chair (including a wheelchair)?: None Help needed standing up from a chair using your arms (e.g., wheelchair or bedside chair)?: None Help needed to walk in hospital room?: None Help needed climbing 3-5 steps with a railing? : None 6 Click Score: 24    End of Session   Activity Tolerance: Patient tolerated treatment well Patient left: in chair Nurse Communication: Mobility status PT Visit Diagnosis: Difficulty in walking, not elsewhere classified (R26.2)    Time: 5573-2202 PT Time Calculation (min) (ACUTE ONLY): 13 min   Charges:   PT Evaluation $PT Eval Moderate Complexity: 1 Mod          Lorrin Goodell, PT  Office # [phone] Pager [phone]   Lorriane Shire 08/21/2022, 8:53 AM

## 2022-08-21 NOTE — Progress Notes (Signed)
Echocardiogram 2D Echocardiogram has been performed.  Rodney Schultz 08/21/2022, 1:16 PM

## 2022-08-21 NOTE — TOC Initial Note (Signed)
Transition of Care [facility]) - Initial/Assessment Note    Patient Details  Name: Rodney Schultz MRN: [id number] Date of Birth: 10/05/1960  [facility]) CM/SW Contact:    Verdell Carmine, RN Phone Number: 08/21/2022, 5:12 PM  Clinical Narrative:                  TOC has reviewed patient and found no needs. The patient will be discussed in daily progression rounds. If a need is identified please place a TOC consult       Patient Goals and CMS Choice            Expected Discharge Plan and Services                                              Prior Living Arrangements/Services                       Activities of Daily Living Home Assistive Devices/Equipment: None ADL Screening (condition at time of admission) Patient's cognitive ability adequate to safely complete daily activities?: Yes Is the patient deaf or have difficulty hearing?: No Does the patient have difficulty seeing, even when wearing glasses/contacts?: No Does the patient have difficulty concentrating, remembering, or making decisions?: No Patient able to express need for assistance with ADLs?: No Does the patient have difficulty dressing or bathing?: No Independently performs ADLs?: Yes (appropriate for developmental age) Does the patient have difficulty walking or climbing stairs?: No Weakness of Legs: None Weakness of Arms/Hands: None  Permission Sought/Granted                  Emotional Assessment              Admission diagnosis:  Hyperosmolar hyperglycemic state (HHS) ([place]) [E11.00] Patient Active Problem List   Diagnosis Date Noted   Unintentional weight loss 08/20/2022   Hyperosmolar hyperglycemic state (HHS) ([place]) 08/19/2022   Heart failure ([place]) 06/18/2021   Elevated troponin    SOB (shortness of breath)    History of tobacco abuse    Elevated brain natriuretic peptide (BNP) level    Testicular abnormality 03/27/2021   Hyperlipidemia 03/27/2021    Proteinuria 03/27/2021   Essential hypertension, benign 10/22/2020   Hematuria 10/22/2020   Diabetes mellitus ([place]) 10/22/2020   Abnormal EKG 10/22/2020   Type 2 diabetes mellitus with hyperglycemia ([place]) 03/21/2019   PCP:  Pcp, No Pharmacy:   [place] [address] [place] [address] Phone: [phone] Fax: [phone]  [facility], [place] [address] [place] [address] Phone: [phone] Fax: [place] [address] Phone: [phone] Fax: [phone]     Social Determinants of Health (SDOH) Social History: SDOH Screenings   Food Insecurity: Food Insecurity Present (08/20/2022)  Housing: Low Risk  (08/20/2022)  Transportation Needs: No Transportation Needs (08/20/2022)  Utilities: Not At Risk (08/20/2022)  Alcohol Screen: Low Risk  (06/20/2021)  Depression (PHQ2-9): Low Risk  (03/27/2021)  Financial Resource Strain: High Risk (02/12/2022)  Tobacco Use: High Risk (08/19/2022)   SDOH Interventions:     Readmission Risk Interventions     No data to display

## 2022-08-21 NOTE — Progress Notes (Signed)
Witnessed seizure by NT and myself.  Facial twitching + R arm twitching lasting 1-2 min.  Dr. Posey Pronto, IMTS now at beside and Dr. Curly [place], Neurology MD in route.  Pt following commands after, no incontinence but seems postictal on exam.  No meds given for resolution of seizure

## 2022-08-21 NOTE — Progress Notes (Signed)
15 second seizure witnessed.  Facial twitching throughout.  IMTS + Neuro notified.  IMTS coming to beside.  Will continue to monitor.

## 2022-08-21 NOTE — Discharge Instructions (Addendum)

## 2022-08-21 NOTE — Evaluation (Signed)
Occupational Therapy Evaluation Patient Details Name: Rodney Schultz MRN: [id number] DOB: [birth year] Today's Date: 08/21/2022   History of Present Illness Pt is a 62 y/o male presenting on 1/31 with AMS. Found to be in [facility] and admitted for further workup. 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 PMH includes: DM, HTN.   Clinical Impression   PTA patient reports independent, driving. Admitted for above and presents with problem list below.  Pt independent for ADLs and functional mobility in room.  Cognitively, pt oriented but demonstrating slow processing, decreased awareness, recall, difficulty sequencing and attending to tasks.  Discussed recommendations to have assist with med mgmt, finances, driving and cooking at this time; pt agreeable and reports he has assist.  Will follow acutely and recommend continued OT services at dc for outpatient OT address higher level cognition.       Recommendations for follow up therapy are one component of a multi-disciplinary discharge planning process, led by the attending physician.  Recommendations may be updated based on patient status, additional functional criteria and insurance authorization.   Follow Up Recommendations  Outpatient OT (neuro OT for higher level cognition)     Assistance Recommended at Discharge Intermittent Supervision/Assistance  Patient can return home with the following Assistance with cooking/housework;Direct supervision/assist for medications management;Direct supervision/assist for financial management;Assist for transportation    Functional Status Assessment  Patient has had a recent decline in their functional status and demonstrates the ability to make significant improvements in function in a reasonable and predictable amount of time.  Equipment Recommendations  None recommended by OT    Recommendations for Other Services Speech consult (cog)     Precautions / Restrictions  Precautions Precautions: None Restrictions Weight Bearing Restrictions: No      Mobility Bed Mobility Overal bed mobility: Independent                  Transfers Overall transfer level: Independent                        Balance Overall balance assessment: No apparent balance deficits (not formally assessed)                                         ADL either performed or assessed with clinical judgement   ADL Overall ADL's : Needs assistance/impaired     Grooming: Standing;Modified independent           Upper Body Dressing : Modified independent;Sitting   Lower Body Dressing: Modified independent;Sit to/from stand   Toilet Transfer: Modified Independent;Ambulation           Functional mobility during ADLs: Modified independent       Vision Baseline Vision/History: 1 Wears glasses (all the time) Ability to See in Adequate Light: 0 Adequate Patient Visual Report: No change from baseline Vision Assessment?: No apparent visual deficits Additional Comments: read clock, locates items in room without assist     Perception     Praxis      Pertinent Vitals/Pain Pain Assessment Pain Assessment: Faces Faces Pain Scale: No hurt     Hand Dominance Right   Extremity/Trunk Assessment Upper Extremity Assessment Upper Extremity Assessment: Overall WFL for tasks assessed   Lower Extremity Assessment Lower Extremity Assessment: Defer to PT evaluation   Cervical / Trunk Assessment Cervical / Trunk Assessment: Normal  Communication Communication Communication: No difficulties   Cognition Arousal/Alertness: Awake/alert Behavior During Therapy: WFL for tasks assessed/performed Overall Cognitive Status: No family/caregiver present to determine baseline cognitive functioning Area of Impairment: Attention, Awareness, Problem solving, Memory, Safety/judgement                   Current Attention Level: Sustained Memory:  Decreased recall of precautions, Decreased short-term memory   Safety/Judgement: Decreased awareness of safety, Decreased awareness of deficits Awareness: Emergent Problem Solving: Slow processing, Difficulty sequencing, Requires verbal cues General Comments: short blessed test with deficits noted in sequencing and recall, attention scoring 12/28 (signifincant impairments). pt endorses difficulty sequencing months backwards but denies changes in cognition. pt with decreased attention, problem solving and awareness.  Asking pt to brush teeth at sink, and pt becomes distracted and begins to shave.     General Comments  VSS on RA    Exercises     Shoulder Instructions      Home Living Family/patient expects to be discharged to:: Private residence Living Arrangements: Spouse/significant other Available Help at Discharge: Family;Available PRN/intermittently Type of Home: House Home Access: Level entry     Home Layout: One level     Bathroom Shower/Tub: Teacher, early years/pre: Standard     Home Equipment: None          Prior Functioning/Environment Prior Level of Function : Independent/Modified Independent;Driving             Mobility Comments: no AD. Able to walk community distances ADLs Comments: occassionaly drives, manages meds/ finances, cooks; not working        OT Problem List: Decreased cognition      OT Treatment/Interventions: Self-care/ADL training;Cognitive remediation/compensation    OT Goals(Current goals can be found in the care plan section) Acute Rehab OT Goals Patient Stated Goal: none stated Time For Goal Achievement: 09/04/22 Potential to Achieve Goals: Good  OT Frequency: Min 2X/week    Co-evaluation              AM-PAC OT "6 Clicks" Daily Activity     Outcome Measure Help from another person eating meals?: None Help from another person taking care of personal grooming?: None Help from another person toileting, which  includes using toliet, bedpan, or urinal?: None Help from another person bathing (including washing, rinsing, drying)?: None Help from another person to put on and taking off regular upper body clothing?: None Help from another person to put on and taking off regular lower body clothing?: None 6 Click Score: 24   End of Session Nurse Communication: Mobility status  Activity Tolerance: Patient tolerated treatment well Patient left: in bed;with call bell/phone within reach  OT Visit Diagnosis: Other symptoms and signs involving cognitive function                Time: 6606-3016 OT Time Calculation (min): 30 min Charges:  OT General Charges $OT Visit: 1 Visit OT Evaluation $OT Eval Moderate Complexity: 1 Mod OT Treatments $Self Care/Home Management : 8-22 mins  Jolaine Artist, OT Acute Rehabilitation Services Office [phone]   Delight Stare 08/21/2022, 11:02 AM

## 2022-08-21 NOTE — Progress Notes (Signed)
Critical CBG 507 for HS check.  Dr. Posey Pronto, IMTS notified

## 2022-08-21 NOTE — Progress Notes (Signed)
Went to evaluate patient around 2015 after patient had a rapid response called on him.  Subjective: Patient reports that he never had a seizure and was purposefully not responding to the nurses when they had called a rapid response. He states that he was doing fine and had no concerns. He reports that he just wanted to be left alone. Patient did not have any concerns. Per nursing, patient was having some facial twitching and patient was completley not responsive. Patient did not have any incontinence nor did he have any tongue bite. When patient came back to, he was a little out of it per nursing.   Objective:  Pulse:79 BP:152/85  General: Well appearing in no acute distress Mouth: No evidence of tongue bite Neuro: Alert and oriented x 3, no focal neurological deficits, CNII-XII intact, heel to shin intact, finger to nose intact   Assessment/Plan: Unclear if patient had a true seizure at this time or not. Patient was cooperative on my exam and did not seem to be in an post ictal state. He was alert and oriented x 3 and there were no deficits. Do not need another CT head at this moment. CBGs was 448 at most recent check when I went to go evaluate patient. If this was a true seizure, I wonder if hyperglycemia could be contributing. Patient could have just not wanted to interact. Rapid team did put on seizure precautions which I agree with the plan -Seizure precautions -Monitor for another seizure

## 2022-08-21 NOTE — Progress Notes (Addendum)
NAME:  Rodney Schultz, MRN:  [id number], DOB:  [birth year], LOS: 2 ADMISSION DATE:  08/19/2022  Subjective  Patient evaluated at bedside. Was asked to elaborate further on his changes in appetite and weight, reports that he is hungry this morning but has had diminished appetite for the past couple of weeks, which he attributes to his Lantus. Explains he feels unwell when taking it, denying any nausea associated with it. He also denies night sweats or fevers. States he does not wish to have any surgeries and wants to be discharged as soon as possible. MRI brain findings were discussed, reluctantly agrees to current treatment plan and need for further imaging.   Patient's fiance, Rodney Schultz, is present over the phone during this discussion. She states he has not been himself this past week, forgetting conversations and not eating as much as he usually does. She believes he has lost approximately 5-10 pounds. Notes however that his speech has improved compared to admission. There is less hesitancy noted when he speaks today. Denies any somatic symptoms.  Objective   Blood pressure (!) 160/88, pulse 68, temperature 98 F (36.7 C), temperature source Oral, resp. rate 18, height 5\' 10"  (1.778 m), weight 82.8 kg, SpO2 100 %.     Intake/Output Summary (Last 24 hours) at 08/21/2022 1039 Last data filed at 08/21/2022 0300 Gross per 24 hour  Intake 583.39 ml  Output --  Net 583.39 ml   Filed Weights   08/19/22 1713 08/21/22 0628  Weight: 72.6 kg 82.8 kg   ROS Negative except as mentioned in the subjective.   Physical Exam General: No acute distress, not ill-appearing. Awake and conversant. Eyes: Normal conjunctiva, anicteric. Round symmetric pupils. ENT: Hearing grossly intact. No nasal discharge. Neck: Neck is supple. No masses or thyromegaly. Respiratory: Respirations are non-labored. No wheezing. Skin: Warm and dry. No rashes or ulcers. Psych: Alert and oriented. Cooperative, Appropriate mood and  affect, Normal judgment. CV: No lower extremity edema. MSK: Normal ambulation. No clubbing or cyanosis. Neuro: Sensation and CN II-XII grossly normal.  Labs       Latest Ref Rng & Units 08/20/2022    2:00 AM 08/19/2022    8:20 PM 08/19/2022    5:20 PM  CBC  WBC 4.0 - 10.5 K/uL 7.9   6.8   Hemoglobin 13.0 - 17.0 g/dL 14.8  15.3  15.0   Hematocrit 39.0 - 52.0 % 41.5  45.0  44.0   Platelets 150 - 400 K/uL 224   226       Latest Ref Rng & Units 08/21/2022    5:15 AM 08/21/2022   12:07 AM 08/20/2022    7:39 PM  BMP  Glucose 70 - 99 mg/dL 260     BUN 8 - 23 mg/dL 30     Creatinine 0.61 - 1.24 mg/dL 2.08     Sodium 135 - 145 mmol/L 138     Potassium 3.5 - 5.1 mmol/L 4.1  4.0  3.8   Chloride 98 - 111 mmol/L 105     CO2 22 - 32 mmol/L 21     Calcium 8.9 - 10.3 mg/dL 9.3       Summary  Rodney Schultz is a 62 yo male with past medical history significant for T2DM (A1C9.3%, 10/23), HTN, HLD, CAD, and HFrEF (LVEF ~42% 11/23) who presents to [facility] on 08/[facility]2022 for concerns of AMS and worsening aphasia, initially treated for HHS, and was found to have small acute infarcts of the left MCA.  Neurology currently following and making recommendations. Today his speech has improved, less aphasic compared to yesterday.   Assessment & Plan:  Principal Problem:   Hyperosmolar hyperglycemic state (HHS) ([place]) Active Problems:   Type 2 diabetes mellitus with hyperglycemia (HCC)   Unintentional weight loss  #Left MCA Strokes MRI brain demonstrates small acute infarcts at the left CA territory. 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 Patient cleared by PT/OT/SLP. No other focal deficits appreciated on exam, aphasia has improved. - Appreciate Neurology's care and recommendations below - Echo pending - Lipid panel pending - Start Plavix 300 mg today, transition to 75 mg qd for 90 days - Start ASA 81 mg qd   #Unintentional weight loss #Early satiety  Per patient and fiance, there appears to have been  unintended weight loss past 2-3 weeks with associated poor appetite and early satiety. Patient reports baseline weight is approx 190 lbs, now at 182.6 lbs.  -CT chest/abd/pelvis w/o contrast to rule out underlying malignancy -Continue to monitor food intake  #HHS, resolved #T2DM Insulin gtt discontinued overnight, current CBGs have ranged between 170s-270s. Will continue to trend glucose and increase his LA insulin requirements with sliding scale.  - Start Semglee 20u once today - Start mSSI - A1C pending - Continue to monitor CBGs   #AKI on CKD stage 3b Stable, improving. Scr 2.3>2.08, GFR 29>31. Likely improved with fluids as HHS resolves. -trend BMP -strict I and Os -avoid nephrotoxic agents  -IVFs  #HTN In agreement with neuro recs, BP goal is normotension. Pressures have been elevated this morning, with max systolic 856. Will restart home hydralazine. Current home medications held:  Entresto 97-103 mg BID, spironolactone 12.5 mg qd, co-reg 12.5 mg BID. -Restart Hydralazine 50 mg TID -Monitor blood pressure    #HFrEF  #CAD #Mixed ischemic/nonischemic cardiomyopathy #HTN Stable. Most recent LVEF 42% (11/23). EKG shows no acute changes. No increased oxygen requirements or signs of LE edema appreciated on exam.  -In the setting of AKI, hold entresto, Empagliflozin, and spironolactone  -Resume home hydral (out of permissive HTN window)  -Strict I' and O's                  -Daily weights   Best practice:  DIET: Diabetic Diet IVF: None DVT PPX: Lovenox 40 mg SQ BOWEL: Miralax & Senokot PRN CODE: Full  Dispo: Pending clinical improvement  Christene Slates, MD Internal Medicine Resident PGY-1 PAGER: [phone] 08/21/2022 10:39 AM  If after hours (below), please contact on-call pager: [phone] 5PM-7AM Monday-Friday 1PM-7AM Saturday-Sunday

## 2022-08-21 NOTE — Progress Notes (Signed)
Spoke with Dr. Posey Pronto about transitioning off insulin gtt.  Plan to turn off insulin gtt, but still monitor CBGs Q1 until 0800.  Basal insulin given earlier in the day.

## 2022-08-21 NOTE — Progress Notes (Addendum)
STROKE TEAM PROGRESS NOTE   INTERVAL HISTORY No family is at the bedside.   Patient sitting up in bed, flat affect with intermittent interaction/decreased attention. Follows simple commands.   MRI showed L basal ganglia infarct, with additional small infarcts involving left frontal and parietal white matter.  MRA showing prox L MCA severe stenosis. CAPTIVA stroke prevention study preliminary discussed with patient.  Recommend Aspirin and Plavix x 3 weeks, then Aspirin alone.   Vitals:   08/21/22 0628 08/21/22 0814 08/21/22 0821 08/21/22 1217  BP:  (!) 160/88 (!) 160/88 (!) 131/94  Pulse:  71 68 63  Resp:   18 16  Temp:  98.4 F (36.9 C) 98 F (36.7 C) 98.3 F (36.8 C)  TempSrc:  Oral Oral Oral  SpO2:  100% 100% 97%  Weight: 82.8 kg     Height:       CBC:  Recent Labs  Lab 08/19/22 1720 08/19/22 2020 08/20/22 0200  WBC 6.8  --  7.9  NEUTROABS 3.8  --   --   HGB 15.0 15.3 14.8  HCT 44.0 45.0 41.5  MCV 84.5  --  82.3  PLT 226  --  841   Basic Metabolic Panel:  Recent Labs  Lab 08/20/22 0200 08/20/22 0442 08/21/22 0007 08/21/22 0515  NA 141  --   --  138  K 3.1*   < > 4.0 4.1  CL 103  --   --  105  CO2 28  --   --  21*  GLUCOSE 218*  --   --  260*  BUN 34*  --   --  30*  CREATININE 2.30*  --   --  2.08*  CALCIUM 10.3  --   --  9.3  MG  --   --   --  2.0  PHOS  --   --   --  4.1   < > = values in this interval not displayed.   Lipid Panel:  Recent Labs  Lab 08/20/22 1939  CHOL 231*  TRIG 514*  HDL 24*  CHOLHDL 9.6  VLDL UNABLE TO CALCULATE IF TRIGLYCERIDE OVER 400 mg/dL  LDLCALC UNABLE TO CALCULATE IF TRIGLYCERIDE OVER 400 mg/dL  LDL direct cal: 73  HgbA1c:  Recent Labs  Lab 08/20/22 0200  HGBA1C >15.5*   Urine Drug Screen: No results for input(s): "LABOPIA", "COCAINSCRNUR", "LABBENZ", "AMPHETMU", "THCU", "LABBARB" in the last 168 hours.  Alcohol Level No results for input(s): "ETH" in the last 168 hours.  IMAGING past 24 hours ECHOCARDIOGRAM  COMPLETE  Result Date: 08/21/2022    ECHOCARDIOGRAM REPORT   Patient Name:   Rodney Schultz Date of Exam: 08/21/2022 Medical Rec #:  [id number]       Height:       70.0 in Accession #:    [id number]      Weight:       182.6 lb Date of Birth:  06-08-1961       BSA:          2.008 m Patient Age:    62 years        BP:           131/94 mmHg Patient Gender: M               HR:           64 bpm. Exam Location:  Inpatient Procedure: 2D Echo, Cardiac Doppler and Color Doppler Indications:    Stroke  History:  Patient has prior history of Echocardiogram examinations. Risk                 Factors:Diabetes and Hypertension.  Sonographer:    Mike Gip Referring Phys: [id number] CAROLYN GUILLOUD IMPRESSIONS  1. Left ventricular ejection fraction, by estimation, is 55 to 60%. The left ventricle has normal function. The left ventricle has no regional wall motion abnormalities. There is mild concentric left ventricular hypertrophy. Left ventricular diastolic parameters are consistent with Grade I diastolic dysfunction (impaired relaxation).  2. Right ventricular systolic function is normal. The right ventricular size is normal. Tricuspid regurgitation signal is inadequate for assessing PA pressure.  3. No evidence of mitral valve regurgitation.  4. Aortic valve regurgitation is not visualized.  5. The inferior vena cava is normal in size with greater than 50% respiratory variability, suggesting right atrial pressure of 3 mmHg. FINDINGS  Left Ventricle: Left ventricular ejection fraction, by estimation, is 55 to 60%. The left ventricle has normal function. The left ventricle has no regional wall motion abnormalities. The left ventricular internal cavity size was normal in size. There is  mild concentric left ventricular hypertrophy. Left ventricular diastolic parameters are consistent with Grade I diastolic dysfunction (impaired relaxation). Right Ventricle: The right ventricular size is normal. Right ventricular systolic  function is normal. Tricuspid regurgitation signal is inadequate for assessing PA pressure. Left Atrium: Left atrial size was normal in size. Right Atrium: Right atrial size was normal in size. Pericardium: There is no evidence of pericardial effusion. Mitral Valve: No evidence of mitral valve regurgitation. Tricuspid Valve: Tricuspid valve regurgitation is not demonstrated. Aortic Valve: Aortic valve regurgitation is not visualized. Pulmonic Valve: Pulmonic valve regurgitation is not visualized. Aorta: The aortic root and ascending aorta are structurally normal, with no evidence of dilitation. Venous: The inferior vena cava is normal in size with greater than 50% respiratory variability, suggesting right atrial pressure of 3 mmHg. IAS/Shunts: No atrial level shunt detected by color flow Doppler.  LEFT VENTRICLE PLAX 2D LVIDd:         4.75 cm      Diastology LVIDs:         3.75 cm      LV e' medial:    4.35 cm/s LV PW:         1.35 cm      LV E/e' medial:  12.0 LV IVS:        1.35 cm      LV e' lateral:   6.42 cm/s LVOT diam:     2.30 cm      LV E/e' lateral: 8.1 LV SV:         65 LV SV Index:   32 LVOT Area:     4.15 cm  LV Volumes (MOD) LV vol d, MOD A2C: 142.0 ml LV vol d, MOD A4C: 155.0 ml LV vol s, MOD A2C: 56.9 ml LV vol s, MOD A4C: 71.8 ml LV SV MOD A2C:     85.1 ml LV SV MOD A4C:     155.0 ml LV SV MOD BP:      84.8 ml RIGHT VENTRICLE             IVC RV Basal diam:  3.10 cm     IVC diam: 1.40 cm RV S prime:     17.60 cm/s TAPSE (M-mode): 1.9 cm LEFT ATRIUM             Index        RIGHT ATRIUM  Index LA diam:        3.60 cm 1.79 cm/m   RA Area:     12.30 cm LA Vol (A2C):   45.5 ml 22.66 ml/m  RA Volume:   25.50 ml  12.70 ml/m LA Vol (A4C):   45.0 ml 22.41 ml/m LA Biplane Vol: 46.6 ml 23.21 ml/m  AORTIC VALVE LVOT Vmax:   90.70 cm/s LVOT Vmean:  62.500 cm/s LVOT VTI:    0.156 m  AORTA Ao Root diam: 3.70 cm Ao Asc diam:  3.20 cm MITRAL VALVE MV Area (PHT): 2.04 cm    SHUNTS MV Decel Time: 371  msec    Systemic VTI:  0.16 m MV E velocity: 52.30 cm/s  Systemic Diam: 2.30 cm MV A velocity: 93.80 cm/s MV E/A ratio:  0.56 Landscape architect signed by Phineas Inches Signature Date/Time: 08/21/2022/1:33:58 PM    Final    MR ANGIO HEAD WO CONTRAST  Result Date: 08/21/2022 CLINICAL DATA:  Stroke follow-up EXAM: MRA HEAD WITHOUT CONTRAST TECHNIQUE: Angiographic images of the Circle of Willis were acquired using MRA technique without intravenous contrast. COMPARISON:  None Available. FINDINGS: POSTERIOR CIRCULATION: --Vertebral arteries: Normal --Inferior cerebellar arteries: Normal. --Basilar artery: Normal. --Superior cerebellar arteries: Normal. --Posterior cerebral arteries: Normal. ANTERIOR CIRCULATION: --Intracranial internal carotid arteries: Normal. --Anterior cerebral arteries (ACA): Normal. --Middle cerebral arteries (MCA): 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 Anatomic variants: Fetal origin of the right posterior cerebral artery. Other: None. IMPRESSION: 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 Otherwise normal intracranial MRA. Electronically Signed   By: Ulyses Jarred M.D.   On: 08/21/2022 02:01   MR BRAIN WO CONTRAST  Result Date: 08/20/2022 CLINICAL DATA:  Mental status change, unknown cause EXAM: MRI HEAD WITHOUT CONTRAST TECHNIQUE: Multiplanar, multiecho pulse sequences of the brain and surrounding structures were obtained without intravenous contrast. COMPARISON:  CT head 08/19/2022. FINDINGS: Brain: Acute perforator infarct in the left basal ganglia with additional small acute infarcts in the left frontal and parietal white matter. Associated edema without mass effect. Additional scattered T2/FLAIR hyperintensities in the white matter nonspecific but compatible with chronic microvascular ischemic disease. Small mild petechial hemorrhage in the perforator infarct. Remote microhemorrhage in the left cerebellum. Vascular: Major arterial flow voids are maintained at the skull base.  Skull and upper cervical spine: Normal marrow signal. Sinuses/Orbits: Clear sinuses.  No acute orbital findings. Other: Trace bilateral mastoid effusions. IMPRESSION: Small acute perforator infarct in the left basal ganglia with additional small acute infarcts in the left frontal and parietal white matter. Small amount of petechial hemorrhage within the perforator infarct. Electronically Signed   By: Margaretha Sheffield M.D.   On: 08/20/2022 15:50    PHYSICAL EXAM  Constitutional: Appears thin, NAD Psych: Flat affect, somewhat cooperative HEENT: [place], AT MSK: no joint deformities.  Cardiovascular: Normal rate and regular rhythm. Perfusing extremities well Respiratory: Effort normal, non-labored breathing GI: Soft.  No distension. There is no tenderness.  Skin: Warm dry and intact visible skin   Neuro: Mental Status: Patient is awake, alert, oriented to person, place, month, year, and situation.  Exam somewhat hindered by patients cooperation/interest.  Mild aphasia, nonfluent speech.  Good comprehension but difficulty with with repetition.  Cranial Nerves: II: Visual Fields are full. PERRL. III,IV, VI: EOMI without ptosis or diploplia.  V: Facial sensation is symmetric  VII: Facial movement is symmetric.  VIII: Hearing intact to voice X: Uvula elevates symmetrically XI: Shoulder shrug is symmetric. XII: tongue is midline   Motor: Tone/Bulk  is normal. 5/5 strength was present in all four extremities.  Sensory: Sensation is symmetric to light touch and temperature in the arms and legs. Cerebellar: FNF and HKS are intact bilaterally. Slight orbiting seen R hand.  Gait:  Deferred   ASSESSMENT/PLAN Rodney Schultz is a 62 y.o. male with a past medical history significant for poorly controlled type 2 diabetes (A1c 15 in May 2023 improving to 9 in October 2023), poorly controlled hypertension, hyperlipidemia, CKD stage III, former smoking (quit February 2023), coronary artery disease,  heart failure with reduced EF (35% in August 2023 as low as 20% in November 2022), mild OSA on 08/2021 sleep study, left renal mass (likely cyst based on ultrasound) Per primary team's collateral from patient's girlfriend Daniel Nones, the patient has had worsening memory for 3 weeks during which time he has been drinking more soda has decreased appetite and increased urination and concern for medication compliance.   LKW: 08/17/2022 Thrombolytic given?: No, out of the window  IA performed?: No, out of the window Premorbid modified rankin scale: 0  Acute Ischemic Stroke, L MCA territory Etiology: Severe intracranial left MCA stenosis   Code Stroke CT head 08/19/2022:  No acute intracranial abnormality  MRI: Small acute perforator infarct in the left basal ganglia with additional small acute infarcts in the left frontal and parietal white matter. Small amount of petechial hemorrhage within the perforator infarct MRA   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 Otherwise normal intracranial MRA Carotid Doppler : pending 2D Echo: LVEF 55-60%, Grade I diastolic dysfunction. Mild concentric LVH.  LDL UNABLE TO CALCULATE IF TRIGLYCERIDE OVER 400 mg/dL [id number] >04.5 VTE prophylaxis - lovenox    Diet   Diet 2 gram sodium Room service appropriate? Yes; Fluid consistency: Thin   aspirin 81 mg daily prior to admission, now on aspirin 81 mg daily and clopidogrel 75 mg daily for 3 months, then Aspirin alone.  Therapy recommendations:  no PT follow-up, Outpatient OT Disposition:  home  Hypertension Home meds:  hydralazine, coreg, resumed Permissive hypertension (OK if < 220/120) but gradually normalize in 5-7 days Long-term BP goal normotensive  Hyperlipidemia Home meds:  Crestor 40mg ,  LDL 73, goal < 70 Add Lipitor 80mg   Continue statin at discharge  Diabetes type II Uncontrolled Home meds: Glipizide, Metformin, Insulin HgbA1c >15.5, goal < 7.0 CBGs Recent Labs    08/21/22 0617  08/21/22 0728 08/21/22 1224  GLUCAP 279* 296* 381*    SSI Patient educated on importance of medication compliance and monitoring blood sugars.   Other Stroke Risk Factors Cigarette smoker: advised to stop smoking ?Congestive heart failure Imdur, Spironolactone at home  Hospital day # 2   Pt seen by Neuro NP/APP and later by MD. Note/plan to be edited by MD as needed.    10/20/22, DNP, AGACNP-BC [facility] Please use AMION for pager and EPIC for messaging  STROKE MD NOTE :  I have personally obtained history,examined this patient, reviewed notes, independently viewed imaging studies, participated in medical decision making and plan of care.ROS completed by me personally and pertinent positives fully documented  I have made any additions or clarifications directly to the above note. Agree with note above.  Patient presented with an altered mental status and speech difficulties due to patchy left basal ganglia and left frontal MCA branch infarct due to severe left MCA stenosis which is likely symptomatic.  Recommend dual antiplatelet therapy aspirin and Plavix for 3 months followed by aspirin alone and  aggressive risk factor modification.  Patient counseled to quit smoking.  He may consider possible participation in the [place] stroke prevention study later as an outpatient and was given   information to review and advised to call later if interested.  Follow-up as an outpatient stroke clinic in 2 months.  Greater than 50% time during this 50-minute visit were spent on counseling and coordination of care about his stroke and intracranial stenosis and discussion about treatment plan and answering questions Antony Contras, MD Medical Director [place] Pager: [phone] 08/21/2022 3:47 PM   To contact Stroke Continuity provider, please refer to [email or web address] After hours, contact General Neurology

## 2022-08-21 NOTE — Progress Notes (Signed)
Neurology Progress Note  Subjective: -1 to 2 minutes of right facial twitching which led to the patient having difficulty swallowing and being unable to speak during the episode although he retained awareness of the episode.  Patient denies any right arm twitching, possibly seen by nursing but not clear. - In the setting of glucose of 500 - Irritable, perseverating on not wanting further testing and wanting to be discharged, but appears to have capacity  Exam: Vitals:   08/21/22 1855 08/21/22 1929  BP: (!) 165/103 (!) 152/85  Pulse: 63 72  Resp:  17  Temp:  97.9 F (36.6 C)  SpO2: 100% 100%   Gen: In bed, comfortable  Resp: non-labored breathing, no grossly audible wheezing Cardiac: Perfusing extremities well  Abd: soft, nt Psych:   Neuro: MS: Awake, alert, able to name and repeat but continues to have examination limitation due to cooperation CN: New mild right facial droop, otherwise grossly remains intact Motor: No pronator drift at this time.  5/5 hip flexion bilaterally with no drift of the lower extremities either   Pertinent Labs:  Basic Metabolic Panel: Recent Labs  Lab 08/19/22 1720 08/19/22 2020 08/19/22 2138 08/20/22 0200 08/20/22 0442 08/20/22 1939 08/21/22 0007 08/21/22 0515  NA 129* 132* 137 141  --   --   --  138  K 4.4 5.0 3.9 3.1* 4.4 3.8 4.0 4.1  CL 92*  --  99 103  --   --   --  105  CO2 25  --  26 28  --   --   --  21*  GLUCOSE 966*  --  628* 218*  --   --   --  274*  260*  BUN 41*  --  40* 34*  --   --   --  30*  CREATININE 2.62*  --  2.42* 2.30*  --   --   --  2.08*  CALCIUM 9.9  --  10.0 10.3  --   --   --  9.3  MG  --   --   --   --   --   --   --  2.0  PHOS  --   --   --   --   --   --   --  4.1    CBC: Recent Labs  Lab 08/19/22 1720 08/19/22 2020 08/20/22 0200  WBC 6.8  --  7.9  NEUTROABS 3.8  --   --   HGB 15.0 15.3 14.8  HCT 44.0 45.0 41.5  MCV 84.5  --  82.3  PLT 226  --  224    Coagulation Studies: Recent Labs     08/19/22 1720  LABPROT 12.2  INR 0.9    Component Ref Range & Units 20:43 18:21 15:05 12:24 07:28 06:17 03:47  Glucose-Capillary 70 - 99 mg/dL 507 High Panic  448 High  CM 368 High  CM 381 High        Impression: Focal seizure with retained awareness in the setting of hyperglycemia likely secondary to known structural lesion of recent left MCA territory stroke.  Patient refuses head CT to rule out acute intracranial process and refuses EEG.  Reviewed seizure precautions, patient states "I did not have a seizure," also reiterates that he refuses to accept that he had a stroke and that there is not anything wrong with his brain, recalls imaging we reviewed early this morning and reports he does not want to review that imaging again.  Given focality is associated with a high risk of seizure recurrence, I do recommend starting antiseizure medication  Recommendations: -EEG recommended but patient refuses and has capacity -Repeat Head CT recommended but patient refuses and has capacity (able to explain back that there is a risk of bleeding and this can be lethal if not appropriately treated) -Keppra 250 mg twice daily given creatinine clearance is in the 30-50 range.  If creatinine clearance changes substantially in the future may need to adjust dose -Appreciate management of hyperglycemia per primary team.  Please also include repeat magnesium check and address any other electrolyte derangements -Patient reports he does not drive already at baseline and that his girlfriend Portia typically drives for him.  Full seizure precautions should be included in discharge instructions, he was counseled on no driving for 6 months -Neurology is available as needed, please do reach out if any additional questions or concerns arise -Recommendations conveyed to primary team via secure chat  Standard seizure precautions: Per [facility] statutes, patients with seizures are not allowed to drive until  they  have been seizure-free for six months. Use caution when using heavy equipment or power tools. Avoid working on ladders or at heights. Take showers instead of baths. Ensure the water temperature is not too high on the home water heater. Do not go swimming alone. When caring for infants or small children, sit down when holding, feeding, or changing them to minimize risk of injury to the child in the event you have a seizure.  To reduce risk of seizures, maintain good sleep hygiene avoid alcohol and illicit drug use, take all anti-seizure medications as prescribed.   Lesleigh Noe MD-PhD [facility] [phone]  Available 7 PM to 7 AM, outside of these hours please call Neurologist on call as listed on Amion.   No charge same-day progress note

## 2022-08-21 NOTE — TOC Benefit Eligibility Note (Signed)
Patient Teacher, English as a foreign language completed.    The patient is currently admitted and upon discharge could be taking Eliquis 5 mg.  The current 30 day co-pay is $4.00.   The patient is insured through [employer] [place] [employer]   Lyndel Safe, [place] Patient Advocate Specialist [place] Patient Advocate Team Direct Number: [phone]  Fax: [phone]

## 2022-08-21 NOTE — Progress Notes (Signed)
Carotid duplex bilateral study completed.   Please see CV Proc for preliminary results.   Bayan Hedstrom, RDMS, RVT  

## 2022-08-21 NOTE — Inpatient Diabetes Management (Signed)
Inpatient Diabetes Program Recommendations  AACE/ADA: New Consensus Statement on Inpatient Glycemic Control (2015)  Target Ranges:  Prepandial:   less than 140 mg/dL      Peak postprandial:   less than 180 mg/dL (1-2 hours)      Critically ill patients:  140 - 180 mg/dL   Lab Results  Component Value Date   GLUCAP 296 (H) 08/21/2022   HGBA1C 9.3 (H) 05/13/2022    Diabetes history: DM2 Outpatient Diabetes medications: Lantus 15 units qd, Glucotrol 10 mg qd, Jardiance 10 mg qd, Metformin 500 mg bid Current orders for Inpatient glycemic control: IV insulin transition to Semglee 20 units x 1, Novolog 0-9 units tid  Inpatient Diabetes Program Recommendations:   Noted Semglee 20 units ordered x 1 - will need ordered daily and adjust as needed  -Add Novolog 5 units tid meal coverage if eats 50% meals -Novolog 0-5 units hs correction  Thank you, Bethena Roys E. Gianlucas Evenson, RN, MSN, CDE  Diabetes Coordinator Inpatient Glycemic Control Team Team Pager [phone] (8am-5pm) 08/21/2022 12:07 PM

## 2022-08-21 NOTE — Progress Notes (Incomplete)
Went to evaluate patient around 2130 after patient had another episode of what was likely a Seizure.  Subjective: Messaged by nursing that patient had another episode of what looked like seizure like activity. Per nursing, patient was not responding and had right upper arm twitching and facial twitching for about 1-2 mins. Patient did not require any medications. When I got up to room patient was minimally responding to questions and seemed to be out of it. He was not as communicative as he was about an hour and 15 minutes ago when I went to initially talk to him.   Objective:  Pulse: 62 BP: 170/92  General: Minimally responsive, unable to keep eye contact or a conversation, looked somnolent, no facial droop  Mouth: Bruising noted to right lateral aspect of tongue  Eyes: Pupils equal and reactive to light, extraocular movement not intact and patient unable to make eye contact  Neuro: Alert and oriented x1, able to follow some directions, heel to shin intact, finger to nose intact   Assessment/Plan:  Patient seemed to be in a post ictal state. He would be in and out of alertness. Patient was able to follow some commands and seeing new bruising on tongue could be consistent with a seizure. Etiology could be due to recent stroke or hyperglycemia. Neuro was informed and is going to come evaluate the patient. Will keep the patient on seizure precautions and will try and get better control of blood glucose. -Appreciate neuro recs -Seizure precautions  -Monitor for further seizures -q2h CBGs -Give Semglee 10 units  -Novolog

## 2022-08-21 NOTE — Significant Event (Signed)
Rapid Response Event Note   Reason for Call :  Second set of eyes  Initial Focused Assessment:  Pt lying in bed, AO. He denies headache or dizziness. Following commands. EOMI. Visual fields intact. Face is symmetrical. Moving all extremities equally. Sensation is equal. Speech is clear.   Skin is warm, dry, pink. He is in no distress.  He tells me is wants to go home in the morning. He is conversant and continues to tell me about his significant other and what brought him into the hospital.   Called back at [facility]. NT witnessed what he believed to be a seizure. Per report, pt face was twitching and his arms were shaking. This lasted for less than a minute. On my assessment at 1859, pt was AO, following commands. No evidence of incontinence. Same assessment as above.   VS: BP 165/103, HR 66, SpO2 100% on room air  Interventions:  -Seizure precautions  Plan of Care:  -RN to notify MD  Call rapid response for additional needs Event Summary:  MD Notified: per RN Call Time: 1819 Arrival Time: 4825 End Time: [place], RN

## 2022-08-21 NOTE — Evaluation (Signed)
Clinical/Bedside Swallow Evaluation Patient Details  Name: Rodney Schultz MRN: [id number] Date of Birth: Mar 14, 1961  Today's Date: 08/21/2022 Time: SLP Start Time (ACUTE ONLY): 0845 SLP Stop Time (ACUTE ONLY): 0900 SLP Time Calculation (min) (ACUTE ONLY): 15 min  Past Medical History:  Past Medical History:  Diagnosis Date   Diabetes mellitus without complication ([place])    GERD (gastroesophageal reflux disease)    Hypertension    Past Surgical History:  Past Surgical History:  Procedure Laterality Date   RIGHT HEART CATH N/A 08/11/2021   Procedure: RIGHT HEART CATH;  Surgeon: Larey Dresser, MD;  Location: [facility];  Service: Cardiovascular;  Laterality: N/A;   testical  Right    HPI:  Pt is a 62 y.o.  male who presents to the ED with concerns of altered mental status.  Per charting, patient has had partial aphasia for the past 7 days. 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 Small amount of petechial hemorrhage within the perforator infarct". Passed yale 08/20/22. PMH: GERD, type 2 diabetes, hypertension, hyperlipidemia, CKD stage III, CAD, HFrEF, mild OSA on 08/2021 sleep study, left renal mass (likely cyst based on ultrasound).    Assessment / Plan / Recommendation  Clinical Impression  Pt presents with functional oropharyngeal swallow at bedside this am. Pt and RN report no difficulties swallowing post CVA. Oral mechanism examination unremarkable. Of note, after performance of initial volitional cough, he spontaneously produced a stronger congested cough prior to POs. He self-fed consecutive sips of thin liquids by straw exhibiting similar brief congested coughing on initial trials, with subsequent single/consecutive sipping without clinical signs of aspiration. Puree and regular textures were consumed with appropriate/timely oral prep and clearance. Suspect  s/sx observed this date are not in relation to POs given baseline cough. Recommend continue regular diet/thin liquids with adherence to universal swallow and reflux precautions, given reported hx of GERD. No further SLP f/u indicated at this time. Will s/o.  SLP Visit Diagnosis: Dysphagia, unspecified (R13.10)    Aspiration Risk  Mild aspiration risk    Diet Recommendation Regular;Thin liquid   Liquid Administration via: Cup;Straw Medication Administration: Whole meds with liquid Supervision: Patient able to self feed Compensations: Minimize environmental distractions;Slow rate;Small sips/bites Postural Changes: Seated upright at 90 degrees;Remain upright for at least 30 minutes after po intake    Other  Recommendations Oral Care Recommendations: Oral care BID    Recommendations for follow up therapy are one component of a multi-disciplinary discharge planning process, led by the attending physician.  Recommendations may be updated based on patient status, additional functional criteria and insurance authorization.  Follow up Recommendations No SLP follow up      Assistance Recommended at Discharge    Functional Status Assessment Patient has not had a recent decline in their functional status  Frequency and Duration            Prognosis        Swallow Study   General Date of Onset: 08/19/22 HPI: Pt is a 62 y.o.  male who presents to the ED with concerns of altered mental status.  Per charting, patient has had partial aphasia for the past 7 days. 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 Small amount of petechial hemorrhage within the perforator infarct". Passed  [facility] 08/20/22. PMH: GERD, type 2 diabetes, hypertension, hyperlipidemia, CKD stage III, CAD, HFrEF, mild OSA on 08/2021 sleep study, left renal mass (likely cyst based on ultrasound). Type of Study:  Bedside Swallow Evaluation Previous Swallow Assessment: none per EMR Diet Prior to this Study: Regular;Thin liquids Temperature Spikes Noted: No Respiratory Status: Room air History of Recent Intubation: No Behavior/Cognition: Alert;Cooperative;Pleasant mood Oral Cavity Assessment: Within Functional Limits Oral Care Completed by SLP: No Oral Cavity - Dentition: Adequate natural dentition Vision: Functional for self-feeding Self-Feeding Abilities: Able to feed self Patient Positioning: Upright in chair;Postural control adequate for testing Baseline Vocal Quality: Normal Volitional Cough: Strong;Congested Volitional Swallow: Able to elicit    Oral/Motor/Sensory Function Overall Oral Motor/Sensory Function: Within functional limits   Ice Chips Ice chips: Not tested   Thin Liquid Thin Liquid: Within functional limits Presentation: Self Fed;Straw Other Comments: coughing on initial consecutive sips, then resolved    Nectar Thick Nectar Thick Liquid: Not tested   Honey Thick Honey Thick Liquid: Not tested   Puree Puree: Within functional limits Presentation: Self Fed;Spoon   Solid     Solid: Within functional limits Presentation: [place], [place], [place] Office Number: [phone]  Rodney Schultz 08/21/2022,9:14 AM

## 2022-08-22 DIAGNOSIS — R569 Unspecified convulsions: Secondary | ICD-10-CM | POA: Diagnosis not present

## 2022-08-22 DIAGNOSIS — E1100 Type 2 diabetes mellitus with hyperosmolarity without nonketotic hyperglycemic-hyperosmolar coma (NKHHC): Secondary | ICD-10-CM | POA: Diagnosis not present

## 2022-08-22 DIAGNOSIS — Z7984 Long term (current) use of oral hypoglycemic drugs: Secondary | ICD-10-CM | POA: Diagnosis not present

## 2022-08-22 LAB — GLUCOSE, CAPILLARY
Glucose-Capillary: 363 mg/dL — ABNORMAL HIGH (ref 70–99)
Glucose-Capillary: 322 mg/dL — ABNORMAL HIGH (ref 70–99)
Glucose-Capillary: 355 mg/dL — ABNORMAL HIGH (ref 70–99)
Glucose-Capillary: 283 mg/dL — ABNORMAL HIGH (ref 70–99)
Glucose-Capillary: 321 mg/dL — ABNORMAL HIGH (ref 70–99)
Glucose-Capillary: 212 mg/dL — ABNORMAL HIGH (ref 70–99)
Glucose-Capillary: 211 mg/dL — ABNORMAL HIGH (ref 70–99)

## 2022-08-22 LAB — BASIC METABOLIC PANEL
Anion gap: 11 (ref 5–15)
BUN: 36 mg/dL — ABNORMAL HIGH (ref 8–23)
CO2: 21 mmol/L — ABNORMAL LOW (ref 22–32)
Calcium: 9.1 mg/dL (ref 8.9–10.3)
Chloride: 102 mmol/L (ref 98–111)
Creatinine, Ser: 1.98 mg/dL — ABNORMAL HIGH (ref 0.61–1.24)
GFR, Estimated: 37 mL/min — ABNORMAL LOW (ref >60)
Glucose, Bld: 355 mg/dL — ABNORMAL HIGH (ref 70–99)
Potassium: 3.8 mmol/L (ref 3.5–5.1)
Sodium: 134 mmol/L — ABNORMAL LOW (ref 135–145)

## 2022-08-22 MED ORDER — INSULIN GLARGINE-YFGN 100 UNIT/ML ~~LOC~~ SOLN
40.0000 [IU] | Freq: Every day | SUBCUTANEOUS | Status: DC
  Administered 2022-08-22 – 2022-08-23 (×2): 40 [IU] via SUBCUTANEOUS
  Filled 2022-08-22 (×2): qty 0.4

## 2022-08-22 MED ORDER — INSULIN ASPART 100 UNIT/ML IJ SOLN
0.0000 [IU] | Freq: Three times a day (TID) | INTRAMUSCULAR | Status: DC
  Administered 2022-08-22: 5 [IU] via SUBCUTANEOUS
  Administered 2022-08-22: 11 [IU] via SUBCUTANEOUS
  Administered 2022-08-23: 5 [IU] via SUBCUTANEOUS
  Administered 2022-08-23: 8 [IU] via SUBCUTANEOUS

## 2022-08-22 MED ORDER — INSULIN ASPART 100 UNIT/ML IJ SOLN
0.0000 [IU] | Freq: Every day | INTRAMUSCULAR | Status: DC
  Administered 2022-08-22: 2 [IU] via SUBCUTANEOUS

## 2022-08-22 NOTE — Progress Notes (Addendum)
HD#3 SUBJECTIVE:  Patient Summary: Rodney Schultz is a 62 y.o. with a pertinent PMH of T2DM, HTN, HLD, CAD, and HFrEF who presented for concerns of AMS and worsening aphasia, initially treated for HHS, and was found to have small acute infarcts of the left MCA.  Overnight Events: Patient had three seizures. He does not have a history of seizures.  Interim History: Patient is eager to go home and does not want further work-up for new seizures including CT head to assess for hemorrhagic conversion of his strokes or EEG to evaluate for seizure activity. He admits that his blood sugar has been very high and difficult to control. He understands that in order to safely discharge him, we need to get his blood sugar under control.  OBJECTIVE:  Vital Signs: Vitals:   08/21/22 1855 08/21/22 1929 08/21/22 2300 08/22/22 0405  BP: (!) 165/103 (!) 152/85 (!) 151/83 (!) 127/90  Pulse: 63 72 (!) 58 66  Resp:  17 16 18   Temp:  97.9 F (36.6 C) 98.2 F (36.8 C) 98.1 F (36.7 C)  TempSrc:  Oral Oral Oral  SpO2: 100% 100% 94% 99%  Weight:    87.6 kg  Height:       Supplemental O2: Room Air SpO2: 99 % O2 Flow Rate (L/min): 0 L/min FiO2 (%): 21 %  Filed Weights   08/19/22 1713 08/21/22 0628 08/22/22 0405  Weight: 72.6 kg 82.8 kg 87.6 kg     Intake/Output Summary (Last 24 hours) at 08/22/2022 0758 Last data filed at 08/21/2022 2230 Gross per 24 hour  Intake 360 ml  Output 400 ml  Net -40 ml   Net IO Since Admission: 2,462.2 mL [08/22/22 0758]  Physical Exam: Constitutional:Appears stated age. In no acute distress. Cardio:Regular rate and rhythm. No murmurs, rubs, or gallops. Pulm:Clear to auscultation bilaterally. Normal work of breathing on room air. ATF:TDDUKGUR for extremity edema. Skin:Warm and dry. Neuro:Alert and oriented x3. Bilateral upper and lower extremity strength 5/5. Sensation is equal bilaterally. CN II-XII grossly intact. Psych:Pleasant mood and affect.  Patient  Lines/Drains/Airways Status     Active Line/Drains/Airways     Name Placement date Placement time Site Days   Peripheral IV 08/20/22 20 G Anterior;Right;Upper Arm 08/20/22  0143  Arm  2             ASSESSMENT/PLAN:  Assessment: Principal Problem:   Hyperosmolar hyperglycemic state (HHS) ([place]) Active Problems:   Type 2 diabetes mellitus with hyperglycemia ([place])   Unintentional weight loss   Plan: #New seizure Overnight Mr. Fambrough had at least 2 witnessed seizures. During the events his blood sugar was elevated to >500. Neurology assessed at bedside and recommended EEG and reapeat head CT to assess for hemorrhagic conversion of acute CVA however patient refused both. He refused these studies again this morning, and would not give a specific reason other than "I'm not about that." He was started on keppra 250 mg BID. Etiology of new seizure may be due acute CVA and/or hyperglycemia. Magnesium checked overnight and was normal. -Neurology following, appreciate their recommendations  -Continue keppra 250 mg BID  -Glycemic control  #Left MCA Strokes No new neurological findings today. His speech is clear though with minor delay. Echocardiogram obtained and showed LV EF 55 to 60% with normal function and no regional wall motion abnormalities; mild concentric LVH; normal RV systolic function and size; no MR or AR regurgitation. Carotid ultrasound showed R carotid is near-normal with only minimal wall thickening or plaque,  L ICA velocities consistent with 1-39% stenosis, bilateral vertebral arteries with antegrade flow, and normal flow hemodynamics in bilateral subclavian arteries. Lipid profile with LDL of 73, above goal of <70. He was on plavix and ASA however in setting of atrial fibrillation on EKG, this was changed to eliquis -Neurology following, appreciate their recommendations  -ASA 81 mg daily indefinitely -Continue atorvastatin 80 mg daily -Continue eliquis 5 mg BID -Glycemic  control   #Unintentional weight loss #Early satiety  CT chest/abdomen/pelvis obtained due to concern for possible malignancy as cause of early satiety and unintentional weight loss showed minimal ground glass opacities in the bilateral lower lobes and cholelithiasis but no evidence of malignancy or metastatic disease. -Encourage PO intake   #HHS, resolved #T2DM Patient required total of 30 units semglee and 42 units novolog over the past 24h. Fasting CBG remained severely elevated at 355. HbA1c >15.5. -Increase semglee to 40 units daily -SSI TID with meals and at bedtime   #AKI on CKD stage 3b Renal function continues to improve, serum creatinine 2.19>1.98 and GFR 33>37 from yesterday. I suspect AKI is pre-renal in nature and will continue to improve with further correction of hyperglycemia and volume status. -Trend BMP -Strict I's/O's -Avoid nephrotoxic agents    #HTN Normotensive at this time. Current regimen is hydralazine 50 mg TID. Currently holding home entresto 97-103 BID, spironolactone 12.5 mg daily, carvedilol 12.5 mg BID. -Continue hydralazine 50 mg TID  Best Practice: Diet: 2 gram sodium IVF: None VTE: SCDs Code: Full AB: None DISPO: Anticipated discharge to Home pending  adequate glycemic control .  Signature: Farrel Gordon, D.O.  Internal Medicine Resident, PGY-2 [facility]  Pager: [phone]   Please contact the on call pager after 5 pm and on weekends at [phone].

## 2022-08-22 NOTE — Progress Notes (Signed)
Occupational Therapy Treatment Patient Details Name: Rodney Schultz MRN: [id number] DOB: June 26, 1961 Today's Date: 08/22/2022   History of present illness Pt is a 62 y/o male presenting on 1/31 with AMS. Found to be in [facility] and admitted for further workup. 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 PMH includes: DM, HTN.   OT comments  Session focused on administration of pill box test to assess problem solving, working memory and ability to self correct during functional task. Pt initially made > 2 mistakes resulting in fail of pill box test so refocused session on education of appropriate med mgmt and understanding dosage directions. After this education, pt was able to complete pill box and later self correct a mistake without cues. Encouraged pt to have initial assist at home w/ med mgmt as medications may have been changed during this admission. Pt reports his significant other may can assist him at home.    Recommendations for follow up therapy are one component of a multi-disciplinary discharge planning process, led by the attending physician.  Recommendations may be updated based on patient status, additional functional criteria and insurance authorization.    Follow Up Recommendations  Outpatient OT (higher level cognition)     Assistance Recommended at Discharge Set up Supervision/Assistance  Patient can return home with the following  Assistance with cooking/housework;Direct supervision/assist for medications management;Direct supervision/assist for financial management;Assist for transportation   Equipment Recommendations  None recommended by OT    Recommendations for Other Services Speech consult (cog)    Precautions / Restrictions Precautions Precautions: None Restrictions Weight Bearing Restrictions: No       Mobility Bed Mobility Overal bed mobility: Independent                  Transfers                          Balance                                           ADL either performed or assessed with clinical judgement   ADL Overall ADL's : Needs assistance/impaired                                       General ADL Comments: focus on pill box test assessment (see cognition section for details). Encouraged pt to have assist initially at home, does endorse having a pill box he can use and a significant other who has helped in the past with meds    Extremity/Trunk Assessment Upper Extremity Assessment Upper Extremity Assessment: Overall WFL for tasks assessed   Lower Extremity Assessment Lower Extremity Assessment: Defer to PT evaluation        Vision   Vision Assessment?: No apparent visual deficits   Perception     Praxis      Cognition Arousal/Alertness: Awake/alert Behavior During Therapy: WFL for tasks assessed/performed Overall Cognitive Status: Impaired/Different from baseline Area of Impairment: Awareness, Problem solving, Memory, Safety/judgement                     Memory: Decreased recall of precautions, Decreased short-term memory   Safety/Judgement: Decreased awareness of safety, Decreased awareness of deficits Awareness: Emergent Problem Solving: Slow processing, Requires  verbal cues General Comments: Administered pill box test with pt initially only filling out one column of pill box, in incorrect time slot and placing 2x/day meds in one slot indicating a failing score. As pt initially did this in 1 minute of starting test, took the time to cue, educate on mistakes and allow pt time to correct. After this, pt was able to later self correct another mistake without need for prompting.        Exercises      Shoulder Instructions       General Comments Daughter at bedside    Pertinent Vitals/ Pain       Pain Assessment Pain Assessment: No/denies pain  Home Living                                           Prior Functioning/Environment              Frequency  Min 2X/week        Progress Toward Goals  OT Goals(current goals can now be found in the care plan section)  Progress towards OT goals: Progressing toward goals  Acute Rehab OT Goals Patient Stated Goal: home today Time For Goal Achievement: 09/04/22 Potential to Achieve Goals: Good ADL Goals Additional ADL Goal #1: Pt will recall and complete 3 step trail making tasks in distracting environment with supervision. Additional ADL Goal #2: Pt will complete pill box test with independence.  Plan Discharge plan remains appropriate    Co-evaluation                 AM-PAC OT "6 Clicks" Daily Activity     Outcome Measure   Help from another person eating meals?: None Help from another person taking care of personal grooming?: None Help from another person toileting, which includes using toliet, bedpan, or urinal?: None Help from another person bathing (including washing, rinsing, drying)?: None Help from another person to put on and taking off regular upper body clothing?: None Help from another person to put on and taking off regular lower body clothing?: None 6 Click Score: 24    End of Session    OT Visit Diagnosis: Other symptoms and signs involving cognitive function   Activity Tolerance Patient tolerated treatment well   Patient Left in bed;with call bell/phone within reach;with family/visitor present (sitting EOB eating lunch)   Nurse Communication          Time: 0263-7858 OT Time Calculation (min): 17 min  Charges: OT General Charges $OT Visit: 1 Visit OT Treatments $Self Care/Home Management : 8-22 mins  Malachy Chamber, OTR/L Acute Rehab Services Office: [phone]   Layla Maw 08/22/2022, 1:23 PM

## 2022-08-22 NOTE — TOC Progression Note (Signed)
Transition of Care [facility]) - Progression Note    Patient Details  Name: Rodney Schultz MRN: [id number] Date of Birth: Sep 09, 1960  [facility]) CM/SW [place], RN Phone Number: 08/22/2022, 10:42 AM  Clinical Narrative:    Consult placed for medication assistance. The patient has insurance therefore is ineligible for [facility] medication assistance. PCP information placed on AVS        Expected Discharge Plan and Services                                               Social Determinants of Health (SDOH) Interventions SDOH Screenings   Food Insecurity: Food Insecurity Present (08/20/2022)  Housing: Low Risk  (08/20/2022)  Transportation Needs: No Transportation Needs (08/20/2022)  Utilities: Not At Risk (08/20/2022)  Alcohol Screen: Low Risk  (06/20/2021)  Depression (PHQ2-9): Low Risk  (03/27/2021)  Financial Resource Strain: High Risk (02/12/2022)  Tobacco Use: High Risk (08/19/2022)    Readmission Risk Interventions     No data to display

## 2022-08-22 NOTE — Plan of Care (Signed)
  Problem: Coping: Goal: Ability to adjust to condition or change in health will improve Outcome: Progressing   Problem: Fluid Volume: Goal: Ability to maintain a balanced intake and output will improve Outcome: Progressing   Problem: Nutritional: Goal: Maintenance of adequate nutrition will improve Outcome: Progressing   Problem: Fluid Volume: Goal: Ability to achieve a balanced intake and output will improve Outcome: Progressing   Problem: Nutritional: Goal: Maintenance of adequate nutrition will improve Outcome: Progressing

## 2022-08-22 NOTE — Plan of Care (Signed)
  Problem: Coping: Goal: Ability to adjust to condition or change in health will improve Outcome: Progressing   Problem: Fluid Volume: Goal: Ability to maintain a balanced intake and output will improve Outcome: Progressing   Problem: Nutritional: Goal: Maintenance of adequate nutrition will improve Outcome: Progressing   Problem: Fluid Volume: Goal: Ability to achieve a balanced intake and output will improve Outcome: Progressing   Problem: Nutritional: Goal: Maintenance of adequate nutrition will improve Outcome: Progressing   Problem: Education: Goal: Knowledge of disease or condition will improve Outcome: Progressing   Problem: Ischemic Stroke/TIA Tissue Perfusion: Goal: Complications of ischemic stroke/TIA will be minimized Outcome: Progressing

## 2022-08-23 DIAGNOSIS — Z7984 Long term (current) use of oral hypoglycemic drugs: Secondary | ICD-10-CM | POA: Diagnosis not present

## 2022-08-23 DIAGNOSIS — E1100 Type 2 diabetes mellitus with hyperosmolarity without nonketotic hyperglycemic-hyperosmolar coma (NKHHC): Secondary | ICD-10-CM | POA: Diagnosis not present

## 2022-08-23 LAB — BASIC METABOLIC PANEL
Anion gap: 12 (ref 5–15)
BUN: 32 mg/dL — ABNORMAL HIGH (ref 8–23)
CO2: 18 mmol/L — ABNORMAL LOW (ref 22–32)
Calcium: 8.6 mg/dL — ABNORMAL LOW (ref 8.9–10.3)
Chloride: 101 mmol/L (ref 98–111)
Creatinine, Ser: 1.88 mg/dL — ABNORMAL HIGH (ref 0.61–1.24)
GFR, Estimated: 40 mL/min — ABNORMAL LOW (ref >60)
Glucose, Bld: 306 mg/dL — ABNORMAL HIGH (ref 70–99)
Potassium: 3.6 mmol/L (ref 3.5–5.1)
Sodium: 131 mmol/L — ABNORMAL LOW (ref 135–145)

## 2022-08-23 LAB — GLUCOSE, CAPILLARY
Glucose-Capillary: 278 mg/dL — ABNORMAL HIGH (ref 70–99)
Glucose-Capillary: 272 mg/dL — ABNORMAL HIGH (ref 70–99)
Glucose-Capillary: 248 mg/dL — ABNORMAL HIGH (ref 70–99)

## 2022-08-23 MED ORDER — INSULIN GLARGINE 100 UNITS/ML SOLOSTAR PEN: 40.0000 [IU] | PEN_INJECTOR | Freq: Every day | SUBCUTANEOUS | 11 refills | Status: DC

## 2022-08-23 MED ORDER — LEVETIRACETAM 250 MG PO TABS
250.0000 mg | ORAL_TABLET | Freq: Two times a day (BID) | ORAL | 2 refills | Status: DC
  Filled 2022-09-28: qty 60, 30d supply, fill #0
  Filled 2022-11-17: qty 60, 30d supply, fill #1

## 2022-08-23 MED ORDER — INSULIN ASPART 100 UNIT/ML FLEXPEN: 5.0000 [IU] | PEN_INJECTOR | Freq: Three times a day (TID) | SUBCUTANEOUS | 11 refills | Status: DC

## 2022-08-23 MED ORDER — APIXABAN 5 MG PO TABS: 5.0000 mg | ORAL_TABLET | Freq: Two times a day (BID) | ORAL | 2 refills | Status: DC

## 2022-08-23 MED ORDER — ATORVASTATIN CALCIUM 80 MG PO TABS: 80.0000 mg | ORAL_TABLET | Freq: Every day | ORAL | 2 refills | Status: DC

## 2022-08-23 NOTE — Discharge Summary (Signed)
Name: Rodney Schultz MRN: [id number] DOB: [birth year] 62 y.o. PCP: Pcp, No  Date of Admission: 08/19/2022  4:55 PM Date of Discharge:  08/23/2022 Attending Physician: Dr. Antony Contras  DISCHARGE DIAGNOSIS:  Primary Problem: Hyperosmolar hyperglycemic state (HHS) [facility])   Hospital Problems: Principal Problem:   Hyperosmolar hyperglycemic state (HHS) (HCC) Active Problems:   Type 2 diabetes mellitus with hyperglycemia (HCC)   Unintentional weight loss    DISCHARGE MEDICATIONS:   Allergies as of 08/23/2022       Reactions   Influenza Vaccines    Sick- was put in hospital         Medication List     STOP taking these medications    carvedilol 12.5 MG tablet Commonly known as: COREG   glipiZIDE 10 MG tablet Commonly known as: GLUCOTROL   isosorbide mononitrate 30 MG 24 hr tablet Commonly known as: IMDUR   rosuvastatin 40 MG tablet Commonly known as: CRESTOR   spironolactone 25 MG tablet Commonly known as: ALDACTONE       TAKE these medications    Accu-Chek Guide test strip Generic drug: glucose blood Use twice daily to check blood sugars   Accu-Chek Guide w/Device Kit Use as directed twice daily.   Accu-Chek Softclix Lancets lancets Use to check blood sugar twice daily   apixaban 5 MG Tabs tablet Commonly known as: ELIQUIS Take 1 tablet (5 mg total) by mouth 2 (two) times daily.   aspirin 81 MG chewable tablet Commonly known as: Aspirin Childrens Chew 1 tablet (81 mg total) by mouth daily.   atorvastatin 80 MG tablet Commonly known as: LIPITOR Take 1 tablet (80 mg total) by mouth daily. Start taking on: August 24, 2022   hydrALAZINE 50 MG tablet Commonly known as: APRESOLINE Take 1 tablet (50 mg total) by mouth 3 (three) times daily.   insulin aspart 100 UNIT/ML FlexPen Commonly known as: NOVOLOG Inject 5 Units into the skin 3 (three) times daily with meals.   insulin glargine 100 unit/mL Sopn Commonly known as: LANTUS Inject 40 Units  into the skin daily. What changed: how much to take   Jardiance 10 MG Tabs tablet Generic drug: empagliflozin Take 1 tablet (10 mg total) by mouth daily before breakfast.   levETIRAcetam 250 MG tablet Commonly known as: KEPPRA Take 1 tablet (250 mg total) by mouth 2 (two) times daily.   metFORMIN 500 MG 24 hr tablet Commonly known as: GLUCOPHAGE-XR Take 1 tablet (500 mg total) by mouth 2 (two) times daily.   Repatha SureClick 140 MG/ML Soaj Generic drug: Evolocumab Inject 140 mg into the skin every 14 (fourteen) days.   sacubitril-valsartan 97-103 MG Commonly known as: ENTRESTO Take 1 tablet by mouth 2 (two) times daily.   triamcinolone cream 0.1 % Commonly known as: KENALOG Apply 1 Application topically 2 (two) times daily.   Unifine Pentips 32G X 4 MM Misc Generic drug: Insulin Pen Needle Use daily with insulin   Vascepa 1 g capsule Generic drug: icosapent Ethyl Take 2 capsules (2 g total) by mouth 2 (two) times daily.        DISPOSITION AND FOLLOW-UP:  Rodney Schultz was discharged from [facility] in Stable condition. At the hospital follow up visit please address:  Acute metabolic encephalopathy HHS T2DM Titrate insulin regimen as needed. Consider restarting previous medications including glipizide 10 mg daily which he says worked very well for his glycemic control and that he has not had in some time.   Acute  CVA Maximize risk factor modification.  Seizure  Ensure compliance with Keppra. If not already set up, would recommend referral to follow up with neurology.  AKI on CKD stage 3b Recheck BMP.  Atrial fibrillation Ensure compliance with eliquis.   HTN Restart spironlactone, imdur, and carvedilol as tolerated by blood pressure and heart rate.   HLD Ensure patient has refills of repatha and vascepa   Follow-up Recommendations: Consults: Neurology, Diabetes Educator Labs: Basic Metabolic Profile Studies: None Medications:    1. NEW at discharge:  -lantus 40 units daily  -novolog 5 units TID with meals  -atorvastatin 80 mg daily  -keppra 250 mg twice daily 2. HELD at discharge until follow-up:  -carvedilol 12.5 mg BID  -glipizide 10 mg daily  -imdur 30 mg daily  -rosuvastatin 40 mg daily  -spironolactone 25 mg daily  Follow-up Appointments:  Follow-up Information     [facility] Follow up.   Why: Our clinic will call you to schedule a follow up appointment in the next 1 week. Contact information: [address] [place] COURSE:  Patient Summary: Acute metabolic encephalopathy HHS T2DM Patient presented with altered mental status. Initial CT head did not show acute CVA. He did not have focal neurological deficits on initial exam. Pertinent initial lab values include sodium 129 (corrected 143), glucose 966, BUN/Cr 41/2.62, GFR 27; beta-hydroxybutyric acid 0.76, serum osmolality 347; lipase 58; VBG with bicarbonate of 29.0; UA with >500 glucose, 100 protein. He was admitted for HHS and started on Endotool with appropriate response and was transitioned to home semglee dose with SSI. He had poor PO intake so this was ultimately transitioned back to endotool and a more gradual transition back to subcutaneous insulin was enforced. His insulin was titrated throughout his admission. He was discharged on semglee 40 units daily and novolog 5 units three times daily with meals. HbA1c was >15.5%, increased from 9.3% 3 months ago.  Acute CVA CT head on initial presentation was negative for acute intracranial process however due to persistent altered mental status and altered speech, MRI brain was obtained which showed a small acute perforator infarct in the left basal ganglia with additional small acute infarcts in the left frontal and parietal white matter with a small amount of petechial hemorrhage within the perforator  infarct. 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 Carotid ultrasound showed R carotid is near-normal with only minimal wall thickening or plaque, L ICA velocities consistent with 1-39% stenosis, bilateral vertebral arteries with antegrade flow, and normal flow hemodynamics in bilateral subclavian arteries. Echocardiogram showed LV EF 55-60% with normal function, no regional wall motion abnormalities, mild concentric LVH; RV function normal with normal size; no MV or AV regurgitation. He was initiated on high-dose statin, aspirin, and plavix, with plavix ultimately changed to Eliquis due to finding of new atrial fibrillation.  Seizure  Patient had 3 seizures overnight HD2. He has no seizure history. He refused repeat CT head to assess for hemorrhagic conversion or EEG. Etiology of his seizure was thought to be due to severe hyperglycemia at the time of the episodes (CBG >315) versus a complication of his acute CVA. He was started on keppra 250 mg BID.  AKI on CKD stage 3b Initial labs concerning for AKI on CKD stage 3b with serum creatinine of 2.62 (apparent baseline 1.9-2.2), and GFR 27.  This was suspected to be pre-renal in nature due to poor PO intake and HHS. His AKI responded well to IVF.   Unintentional weight loss Chart review revealed a ~40 pound weight loss in the last 3 months. Patient did endorse poor appetite and PO intake. Due to concern for malignancy, CT chest/abdomen/pelvis was obtained and showed no evidence of malignancy or metastatic disease, non-specific minimal ground-glass opacities in bilateral LL, cholelithiasis.  Atrial fibrillation New diagnosis, noted on EKG. Rate controlled throughout admission. Started on eliquis 5 mg BID.  HFrEF  CAD Mixed ischemic/nonischemic cardiomyopathy Echocardiogram showed LV EF 55-60% with normal function, no regional wall motion abnormalities, mild concentric LVH; RV function normal with normal size; no MV or AV  regurgitation. Home medications initially held in the setting of AKI on CKD stage 3b. He was euvolemic throughout admission.   HTN Home entresto and spironolactone were held at admission in the setting of AKI. Hydralazine was restarted as mental status improved with good response of his BP.   HLD Lipid panel revealed LDL 73. He was started on atorvastatin 80 mg daily.     DISCHARGE INSTRUCTIONS:   Discharge Instructions     Discharge instructions   Complete by: As directed    Mr. Syme,  It was a pleasure to care for you during your stay at [facility] were admitted because of very high blood sugar and were found to have had a stroke. You also had new seizures. You are doing better now.  Please note the following medication changes: 1. START TAKING the following medications/doses:  -lantus 40 units once daily  -novolog 5 units three times daily with meals  -atorvastatin 80 mg daily  -keppra 250 mg twice daily 2. CONTINUE:  -metformin 500 mg twice daily  -aspirin 81 mg daily  -hydralazine 50 mg three times daily  -jardiance 10 mg daily  -repatha 140 mg once every 2 weeks  -entresto 97-103 mg twice daily  -vascepa 2 capsules twice daily 3. DO NOT TAKE the following medications until your clinic follow-up visit:  -carvedilol 12.5 mg BID  -glipizide 10 mg daily  -imdur 30 mg daily  -rosuvastatin 40 mg daily  -spironolactone 25 mg daily  When you go home please check your blood sugar 3 times daily and record this value to bring to your follow up appointment in about 1 week. Our clinic will call you to schedule this appointment.  My best, Dr. Marlou Sa   Increase activity slowly   Complete by: As directed        SUBJECTIVE:  Patient says he is feeling great this morning. He is enjoying his breakfast and slept well last night. He is eager to go home and understands the importance of taking all of his medications, including new medication keppra and new doses of  insulin. He would like [facility] with the [facility].   Discharge Vitals:   BP (!) 155/82 (BP Location: Right Arm)   Pulse (!) 54   Temp 99.1 F (37.3 C) (Oral)   Resp 18   Ht 5\' 10"  (1.778 m)   Wt 86.5 kg   SpO2 100%   BMI 27.36 kg/m   OBJECTIVE:  Constitutional:Appears stated age. In no acute distress. Cardio:Regular rate and rhythm. No murmurs, rubs, or gallops. Pulm:Clear to auscultation bilaterally. Normal work of breathing on room air. OZH:YQMVHQIO for extremity edema. Skin:Warm and dry. Neuro:Alert and oriented x3. Bilateral upper and lower extremity strength 5/5. Sensation is equal bilaterally. CN II-XII  grossly intact. Psych:Pleasant mood and affect.   Pertinent Labs, Studies, and Procedures:     Latest Ref Rng & Units 08/20/2022    2:00 AM 08/19/2022    8:20 PM 08/19/2022    5:20 PM  CBC  WBC 4.0 - 10.5 K/uL 7.9   6.8   Hemoglobin 13.0 - 17.0 g/dL 14.8  15.3  15.0   Hematocrit 39.0 - 52.0 % 41.5  45.0  44.0   Platelets 150 - 400 K/uL 224   226        Latest Ref Rng & Units 08/23/2022    1:09 AM 08/22/2022    1:11 AM 08/21/2022   10:40 PM  CMP  Glucose 70 - 99 mg/dL 306  355  502   BUN 8 - 23 mg/dL 32  36  37   Creatinine 0.61 - 1.24 mg/dL 1.88  1.98  2.19   Sodium 135 - 145 mmol/L 131  134  134   Potassium 3.5 - 5.1 mmol/L 3.6  3.8  4.9   Chloride 98 - 111 mmol/L 101  102  99   CO2 22 - 32 mmol/L 18  21  22    Calcium 8.9 - 10.3 mg/dL 8.6  9.1  9.2     MR BRAIN WO CONTRAST  Result Date: 08/20/2022 CLINICAL DATA:  Mental status change, unknown cause EXAM: MRI HEAD WITHOUT CONTRAST TECHNIQUE: Multiplanar, multiecho pulse sequences of the brain and surrounding structures were obtained without intravenous contrast. COMPARISON:  CT head 08/19/2022. FINDINGS: Brain: Acute perforator infarct in the left basal ganglia with additional small acute infarcts in the left frontal and parietal white matter. Associated edema without mass effect. Additional scattered T2/FLAIR  hyperintensities in the white matter nonspecific but compatible with chronic microvascular ischemic disease. Small mild petechial hemorrhage in the perforator infarct. Remote microhemorrhage in the left cerebellum. Vascular: Major arterial flow voids are maintained at the skull base. Skull and upper cervical spine: Normal marrow signal. Sinuses/Orbits: Clear sinuses.  No acute orbital findings. Other: Trace bilateral mastoid effusions. IMPRESSION: Small acute perforator infarct in the left basal ganglia with additional small acute infarcts in the left frontal and parietal white matter. Small amount of petechial hemorrhage within the perforator infarct. Electronically Signed   By: Margaretha Sheffield M.D.   On: 08/20/2022 15:50   CT Head Wo Contrast  Result Date: 08/19/2022 CLINICAL DATA:  Mental status change, difficulty speaking. EXAM: CT HEAD WITHOUT CONTRAST TECHNIQUE: Contiguous axial images were obtained from the base of the skull through the vertex without intravenous contrast. RADIATION DOSE REDUCTION: This exam was performed according to the departmental dose-optimization program which includes automated exposure control, adjustment of the mA and/or kV according to patient size and/or use of iterative reconstruction technique. COMPARISON:  Head CT 11/03/2021 FINDINGS: Brain: No evidence of acute infarction, hemorrhage, hydrocephalus, extra-axial collection or mass lesion/mass effect. Vascular: Atherosclerotic calcifications are present within the cavernous internal carotid arteries. Skull: Normal. Negative for fracture or focal lesion. Sinuses/Orbits: No acute finding. Other: None. IMPRESSION: No acute intracranial abnormality. Electronically Signed   By: Ronney Asters M.D.   On: 08/19/2022 17:58     Signed: Farrel Gordon, D.O.  Internal Medicine Resident, PGY-2 [facility]  Pager: [phone]

## 2022-08-23 NOTE — Progress Notes (Signed)
Discharge instructions reviewed with pt.  Copy of instructions given to pt. Pt informed his scripts were sent to his pharmacy for pick up.  Pt d/c'd via wheelchair with belongings, with family at the main entrance,.              Escorted by unit staff.

## 2022-08-23 NOTE — Progress Notes (Signed)
CSW was alerted that attempts to reach pt's friend Truddie Crumble in regards to DC today was unsuccessful. CSW called [facility] both numbers and left a message. CSE also attempted to call pt's mother and was unable to reach her as well.  Truddie Crumble called CSW back and confirmed pharmacy as [facility] on [address]. Truddie Crumble confirmed that her brother will pick pt up today at DC.

## 2022-08-23 NOTE — Plan of Care (Signed)
  Problem: Education: Goal: Ability to describe self-care measures that may prevent or decrease complications (Diabetes Survival Skills Education) will improve Outcome: Progressing Goal: Individualized Educational Video(s) Outcome: Progressing   

## 2022-08-24 ENCOUNTER — Other Ambulatory Visit (HOSPITAL_BASED_OUTPATIENT_CLINIC_OR_DEPARTMENT_OTHER): Payer: Self-pay

## 2022-08-24 ENCOUNTER — Other Ambulatory Visit (HOSPITAL_COMMUNITY): Payer: Self-pay

## 2022-08-24 MED ORDER — METFORMIN HCL ER 500 MG PO TB24: 500.0000 mg | ORAL_TABLET | Freq: Two times a day (BID) | ORAL | 0 refills | Status: DC

## 2022-08-25 ENCOUNTER — Telehealth: Payer: Self-pay | Admitting: Licensed Clinical Social Worker

## 2022-08-25 NOTE — Telephone Encounter (Signed)
H&V Care Navigation CSW Progress Note  Clinical Social Worker contacted patient by phone to f/u for request for PCP appt by pharmacy team. Pt has been scheduled PCP appts in past, has not attended or did not reschedule all of them at this time. I was able to reach him and his friend Truddie Crumble this morning at [phone]. Introduced self, role, reason for call. Since pt was seen by internal medicine team at the hospital I offered to make an appt for him at their clinic. Pt agreeable. Reconfirmed home address, noted to have Medicaid at this time. He has a ride to appt, receives [employer], but shares ongoing utility and rental challenges. Pt has utilized [facility] before, we discussed utilizing DSS/[employer] to see if any community support available. Pt has been able to receive assistance with $400 light bill from DSS earlier this year. Encouraged him to call if any additional challenges, mailed card, food, transportation, rental/utility assistance packets. I will make note on upcoming [place] visit to remind him of appt.   Appt scheduled on 2/19 at [facility] at 1045am. I have given pt and pt friend the address, time, and date.   Patient is participating in a Managed Medicaid Plan:  Yes- [place]: Food Insecurity Present (08/25/2022)  Housing: Medium Risk (08/25/2022)  Transportation Needs: No Transportation Needs (08/25/2022)  Utilities: Not At Risk (08/25/2022)  Alcohol Screen: Low Risk  (06/20/2021)  Depression (PHQ2-9): Low Risk  (03/27/2021)  Financial Resource Strain: High Risk (08/25/2022)  Tobacco Use: High Risk (08/19/2022)    Westley Hummer, MSW, [employer]  [phone]- work cell phone (preferred) [phone]- desk phone

## 2022-08-25 NOTE — Telephone Encounter (Signed)
Admitted for HHS on 1/31 and discharged on Lantus and Novolog. Still needs PCP.

## 2022-08-26 ENCOUNTER — Other Ambulatory Visit (HOSPITAL_COMMUNITY): Payer: Self-pay

## 2022-08-26 ENCOUNTER — Other Ambulatory Visit: Payer: Self-pay | Admitting: Cardiology

## 2022-08-26 ENCOUNTER — Telehealth: Payer: Self-pay

## 2022-08-26 ENCOUNTER — Other Ambulatory Visit (HOSPITAL_BASED_OUTPATIENT_CLINIC_OR_DEPARTMENT_OTHER): Payer: Self-pay

## 2022-08-26 MED ORDER — GLUCOSE BLOOD VI STRP
ORAL_STRIP | 12 refills | Status: DC
  Filled 2022-08-26: qty 100, fill #0
  Filled 2022-08-26: qty 100, 50d supply, fill #0
  Filled 2022-08-27: qty 100, 50d supply, fill #1

## 2022-08-26 NOTE — Patient Outreach (Signed)
  Emmi Stroke Care Coordination Follow Up  08/26/2022 Name:  Rodney Schultz MRN:  [id number] DOB:  1961-06-01  Subjective: Rodney Schultz is a 62 y.o. year old male who is a primary care patient of Pcp, No   An Emmi alert was received on 08/25/22 indicating patient responded to questions: Questions/problems with meds?.   I reached out by phone to follow up on the alert and spoke to Patient. He voices that he is doing well since returning home. Reviewed and addressed red alert. Patient states that he went to get refill on test strips from [facility] and was told it would be over $150 and his insurance did not cover it. Patient states he normally gets meds including strips from [facility] and pays much lower copays. He confirms he did show new pharmacy his insurance card. Patient voices he has decided to transfer meds back to [facility] where they are cheaper. He called pharmacy this morning to advise and they will alert him when meds ready. He still ahs some strips in the home. He reports fasting cbg this am was 114. Reviewed with patient upcoming MD appts per EMR. Patient confirms awareness and plans to attend. He denies any issues with transportation. No further RN CM needs or concerns at this time.   Care Coordination Interventions:  Yes, provided   Follow up plan: Advised patient that they would continue to get automated EMMI-Stroke post discharge calls to assess how they are doing following recent hospitalization and will receive a call from a nurse if any of their responses were abnormal. Patient voiced understanding and was appreciative of f/u call.   Encounter Outcome:  Pt. Visit Completed   Enzo Montgomery, RN,BSN,CCM [facility] [facility] Telephonic Care Management Coordinator Direct Phone: [phone] Toll Free: [phone] Fax: [phone]

## 2022-08-26 NOTE — Patient Outreach (Signed)
Received a red flag Emmi stroke notification for Mr. Rodney Schultz. I have assigned Enzo Montgomery, RN to call for follow up and determine if there are any Case Management needs.    [facility], [place], [place] [facility] [phone]

## 2022-08-27 ENCOUNTER — Other Ambulatory Visit (HOSPITAL_BASED_OUTPATIENT_CLINIC_OR_DEPARTMENT_OTHER): Payer: Self-pay

## 2022-08-27 ENCOUNTER — Encounter (HOSPITAL_COMMUNITY): Payer: Self-pay | Admitting: Cardiology

## 2022-08-27 ENCOUNTER — Telehealth (HOSPITAL_COMMUNITY): Payer: Self-pay | Admitting: Licensed Clinical Social Worker

## 2022-08-27 ENCOUNTER — Other Ambulatory Visit (HOSPITAL_COMMUNITY): Payer: Self-pay

## 2022-08-27 ENCOUNTER — Ambulatory Visit (HOSPITAL_COMMUNITY)
Admission: RE | Admit: 2022-08-27 | Discharge: 2022-08-27 | Disposition: A | Discharge status: Home / Self Care | Payer: Medicaid Other | Source: Ambulatory Visit | Attending: Cardiology | Admitting: Cardiology

## 2022-08-27 VITALS — BP 104/70 | HR 87 | Wt 186.6 lb

## 2022-08-27 DIAGNOSIS — Z79899 Other long term (current) drug therapy: Secondary | ICD-10-CM | POA: Diagnosis not present

## 2022-08-27 DIAGNOSIS — Z7901 Long term (current) use of anticoagulants: Secondary | ICD-10-CM | POA: Diagnosis not present

## 2022-08-27 DIAGNOSIS — I2510 Atherosclerotic heart disease of native coronary artery without angina pectoris: Secondary | ICD-10-CM | POA: Diagnosis not present

## 2022-08-27 DIAGNOSIS — I130 Hypertensive heart and chronic kidney disease with heart failure and stage 1 through stage 4 chronic kidney disease, or unspecified chronic kidney disease: Secondary | ICD-10-CM | POA: Diagnosis not present

## 2022-08-27 DIAGNOSIS — Z7984 Long term (current) use of oral hypoglycemic drugs: Secondary | ICD-10-CM | POA: Insufficient documentation

## 2022-08-27 DIAGNOSIS — E1122 Type 2 diabetes mellitus with diabetic chronic kidney disease: Secondary | ICD-10-CM | POA: Diagnosis not present

## 2022-08-27 DIAGNOSIS — E785 Hyperlipidemia, unspecified: Secondary | ICD-10-CM | POA: Insufficient documentation

## 2022-08-27 DIAGNOSIS — I480 Paroxysmal atrial fibrillation: Secondary | ICD-10-CM | POA: Insufficient documentation

## 2022-08-27 DIAGNOSIS — Z8673 Personal history of transient ischemic attack (TIA), and cerebral infarction without residual deficits: Secondary | ICD-10-CM | POA: Insufficient documentation

## 2022-08-27 DIAGNOSIS — I5022 Chronic systolic (congestive) heart failure: Secondary | ICD-10-CM | POA: Diagnosis not present

## 2022-08-27 DIAGNOSIS — N183 Chronic kidney disease, stage 3 unspecified: Secondary | ICD-10-CM | POA: Insufficient documentation

## 2022-08-27 DIAGNOSIS — Z794 Long term (current) use of insulin: Secondary | ICD-10-CM | POA: Insufficient documentation

## 2022-08-27 DIAGNOSIS — I701 Atherosclerosis of renal artery: Secondary | ICD-10-CM | POA: Diagnosis not present

## 2022-08-27 LAB — CBC
HCT: 40.9 % (ref 39.0–52.0)
Hemoglobin: 13.9 g/dL (ref 13.0–17.0)
MCH: 28.8 pg (ref 26.0–34.0)
MCHC: 34 g/dL (ref 30.0–36.0)
MCV: 84.9 fL (ref 80.0–100.0)
Platelets: 241 10*3/uL (ref 150–400)
RBC: 4.82 MIL/uL (ref 4.22–5.81)
RDW: 11.9 % (ref 11.5–15.5)
WBC: 6.2 10*3/uL (ref 4.0–10.5)
nRBC: 0 % (ref 0.0–0.2)

## 2022-08-27 LAB — HEMOGLOBIN A1C
Hgb A1c MFr Bld: 15 % — ABNORMAL HIGH (ref 4.8–5.6)
Mean Plasma Glucose: 383.8 mg/dL

## 2022-08-27 LAB — BASIC METABOLIC PANEL
Anion gap: 10 (ref 5–15)
BUN: 23 mg/dL (ref 8–23)
CO2: 22 mmol/L (ref 22–32)
Calcium: 9.3 mg/dL (ref 8.9–10.3)
Chloride: 103 mmol/L (ref 98–111)
Creatinine, Ser: 1.88 mg/dL — ABNORMAL HIGH (ref 0.61–1.24)
GFR, Estimated: 40 mL/min — ABNORMAL LOW (ref >60)
Glucose, Bld: 290 mg/dL — ABNORMAL HIGH (ref 70–99)
Potassium: 4.9 mmol/L (ref 3.5–5.1)
Sodium: 135 mmol/L (ref 135–145)

## 2022-08-27 LAB — BRAIN NATRIURETIC PEPTIDE: B Natriuretic Peptide: 21.4 pg/mL (ref 0.0–100.0)

## 2022-08-27 MED ORDER — CARVEDILOL 6.25 MG PO TABS
6.2500 mg | ORAL_TABLET | Freq: Two times a day (BID) | ORAL | 3 refills | Status: DC
  Filled 2022-08-27 – 2022-11-17 (×2): qty 180, 90d supply, fill #0

## 2022-08-27 MED ORDER — ICOSAPENT ETHYL 1 G PO CAPS
2.0000 g | ORAL_CAPSULE | Freq: Two times a day (BID) | ORAL | 6 refills | Status: DC
  Filled 2022-08-27: qty 120, 30d supply, fill #0

## 2022-08-27 MED ORDER — ACCU-CHEK GUIDE W/DEVICE KIT
PACK | 0 refills | Status: DC
  Filled 2022-08-27: qty 1, fill #0
  Filled 2022-08-28: qty 1, 1d supply, fill #0

## 2022-08-27 MED ORDER — ISOSORBIDE MONONITRATE ER 30 MG PO TB24
30.0000 mg | ORAL_TABLET | Freq: Every day | ORAL | 3 refills | Status: DC
  Filled 2022-08-27: qty 90, 90d supply, fill #0

## 2022-08-27 NOTE — Telephone Encounter (Signed)
H&V Care Navigation CSW Progress Note  Clinical Social Worker received necessary documentation for check request for rental assistance through [name].  CSW will continue to follow and assist as needed.  SDOH Screenings   Food Insecurity: Food Insecurity Present (08/25/2022)  Housing: Medium Risk (08/25/2022)  Transportation Needs: No Transportation Needs (08/26/2022)  Utilities: Not At Risk (08/25/2022)  Alcohol Screen: Low Risk  (06/20/2021)  Depression (PHQ2-9): Low Risk  (03/27/2021)  Financial Resource Strain: High Risk (08/27/2022)  Tobacco Use: High Risk (08/27/2022)   Rodney Ny, LCSW Clinical Social Worker [facility] Desk#: [phone] Cell#: [phone]

## 2022-08-27 NOTE — Progress Notes (Signed)
Heart and Vascular Care Navigation  08/27/2022  Rodney Schultz 18-Nov-1960 [id number]  Reason for Referral: financial concerns Patient is participating in a Managed Medicaid Plan:Yes  Engaged with patient face to face for follow up visit for Heart and Vascular Care Coordination.                                                                                                   Assessment:   CSW states that he is currently struggling to pay his rent.  Gets $988/month but rent is $600/month and states electric bill was $400 and water was around $130.  Electric bill high in the winter and he has applied for [employer]- will be getting assistance with this but just short this month for rent because of increased costs.  CSW reviewed pt previous patient care fund spending and he has utilized about $600 in the past for assistance- CSW explained that we could help with $400 of rent but that we would not be able to help again in the future as he will have reached his limit.  Pt expressed understanding and is agreeable.  CSW also discussed pt current housing situation and patient agreed that it is not sustainable given his income.  He said he had looked into housing I had provided him in the past but there was no availability.  CSW explained that income based housing would likely be full but that he needed to get on a waitlist- provided pt with list of senior affordable housing and encouraged him to pick out 3 that he likes and apply for the waitlist.  CSW also helped pt apply online for the [place] program (62 and older) received confirmation of application being submitted and he will monitor email and phone for contact from them regarding placement in apartment with government assistance voucher.                                     HRT/VAS Care Coordination     Patients Home Cardiology Office Heart Failure Clinic   Outpatient Care Team Social Worker   Social Worker Name: Westley Hummer, [place],  [place]   Living arrangements for the past 2 months Single Family Home   Lives with: Friends   Patient Current Insurance Coverage Medicaid   Patient Has Concern With Paying Medical Bills No   Does Patient Have Prescription Coverage? Yes   Patient Prescription Assistance Programs Heart Failure Fund; Patient Assistance Programs   Home Assistive Devices/Equipment None       Social History:                                                                             [place]  Insecurity: Food Insecurity Present (08/25/2022)  Housing: Medium Risk (08/25/2022)  Transportation Needs: No Transportation Needs (08/26/2022)  Utilities: Not At Risk (08/25/2022)  Alcohol Screen: Low Risk  (06/20/2021)  Depression (PHQ2-9): Low Risk  (03/27/2021)  Financial Resource Strain: High Risk (08/27/2022)  Tobacco Use: High Risk (08/27/2022)    SDOH Interventions: Financial Resources:  Financial Strain Interventions: Other (Comment) (referred to patient care fund) Gets $988/month from [place]:  None reported- gets food stamps  Housing Insecurity:  Housing Interventions: Other (Comment) (helped apply for Project Based Voucher program- provided with list of affordable senior housing)  Transportation:   Has car   Follow-up plan:    Pt to call CSW with land lord information so we can get required documents to submit check request.  Will continue to follow and assist as needed  Jorge Ny, [facility] Desk#: [phone] Cell#: [phone]

## 2022-08-27 NOTE — Patient Instructions (Addendum)
STOP Asprin   START Imdur 30 mg daily.  START Carvedilol 6.25 mg Twice daily  START Vascepa 2g Twice daily  Labs done today, your results will be available in MyChart, we will contact you for abnormal readings.  Repeat blood work in 2 months.  Your physician recommends that you schedule a follow-up appointment in: 1 month  If you have any questions or concerns before your next appointment please send [place] a message through [place] or call our office at [phone].    TO LEAVE A MESSAGE FOR THE NURSE SELECT OPTION 2, PLEASE LEAVE A MESSAGE INCLUDING: YOUR NAME DATE OF BIRTH CALL BACK NUMBER REASON FOR CALL**this is important as we prioritize the call backs  YOU WILL RECEIVE A CALL BACK THE SAME DAY AS LONG AS YOU CALL BEFORE 4:00 PM  At the [facility], you and your health needs are our priority. As part of our continuing mission to provide you with exceptional heart care, we have created designated Provider Care Teams. These Care Teams include your primary Cardiologist (physician) and Advanced Practice Providers (APPs- Physician Assistants and Nurse Practitioners) who all work together to provide you with the care you need, when you need it.   You may see any of the following providers on your designated Care Team at your next follow up: Dr Glori Bickers Dr Loralie Champagne Dr. Roxana Hires, NP Lyda Jester, [place] [facility] [place], [place] Forestine Na, NP Audry Riles, PharmD   Please be sure to bring in all your medications bottles to every appointment.    Thank you for choosing [facility]

## 2022-08-27 NOTE — Progress Notes (Signed)
ADVANCED HF CLINIC NOTE   Primary Care: Pcp, No HF Cardiologist: Dr. Aundra Dubin  HPI: Rodney Schultz is a 62 y.o. AAM w/ h/o HTN, T2DM and HLD, admitted to [facility] w/ new systolic heart failure in 11/22.    He reports long history of poorly controlled HTN in the past. Baseline SBPs would average in the 170s. Denies ETOH use. Former smoker but has quit. No known family h/o CHF. No h/o ischemic-like CP. No known h/o COVID but reports having the flu prior to his 11/22 admission.     Admitted 11/22 w/ new CHF, hypertensive + AKI w/ SCr in the 2.0 range (previous baseline ~1.5). Echo w/ severe biventricular dysfunction, LVEF 20-25% with diffuse hypokinesis, mod LVH, GIIIDD (restrictive), RV severely reduced, moderate MR. R/LHC was deferred initially due to renal insufficiency. He was diuresed w/ IV Lasix. GDMT w/ hydralazine, Imdur and Coreg initiated. Discharge wt 177 lb. Referred to TOC.    Seen 1/23 in HF clinic to establish care for his CHF. R/LHC showed mildly elevated PCWP, pulmonary venous hypertension, preserved CO and diffuse CAD w/ occluded large ramus and moderate D1. 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 No good PCI options and he does not have good targets for CABG (would be high risk CABG with low EF).  Medically managed in setting of no anginal chest pain.   Echo in 4/23 showed EF 25-30% with moderate LVH and normal RV, mild MR. Echo 8/23 showed EF 35% with diffuse hypokinesis and akinesis of the mid inferolateral and mid anterolateral walls, normal RV.   Cardiac MRI in 11/23 showed LV EF 42% with RV EF 52%, there was >50% wall thickness subendocardial LGE in the mid-apical inferolateral and anterolateral walls suggestive of prior MI.   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  Carotid dopplers in 2/24 showed only mild stenosis.  He also had AKI on CKD stage 3.   Supposedly he had atrial fibrillation by ECG but the ECGs in the system all show NSR (though one is labeled atrial fibrillation).  He was started on apixaban.  Imdur, spironolactone, and Coreg were stopped.  Echo in 2/24 showed EF up to 55-60% with mild LVH and normal RV.   Unfortunately, despite a nebulous diagnosis of atrial fibrillation and multiple cardiac med adjustments, cardiology was not consulted during this admission.   Today he returns for HF follow up. He says that he is feeling "great."  No significant exertional dyspnea, able to walk up stairs without problems.  He is walking for exercise. No chest pain.  No BRBPR/melena.  Has PCP appointment set up later this month. Tells me he does not believe that he actually had a stroke, no apparent neurological residuum.  He is in NSR today and denies palpitations. Weight down 17 lbs.   ECG (personally reviewed): NSR, normal.  Labs (1/23): K 4.3, creatinine 1.95 Labs (2/23): K 4.4, creatinine 1.75 Labs (4/23): K 4.4, creatinine 2.27, BNP 45, LDL 123, TGs 409 Labs (6/23): K 5.0,  creatinine 1.7 Labs (9/23): K 5.0, creatinine 2.19 Labs (2/24): K 3.6, creatinine 1.88, TGs 514  PMH: 1. Type 2 diabetes 2. HTN: Poorly controlled.  - Renal artery dopplers: Mild renal artery stenosis.   3. GERD 4. CKD stage 3 5. Hyperlipidemia 6. Former smoker 7. CAD: LHC (1/23) with occluded large ramu, occluded moderate D2, 90% dLAD, 80% dLCx, diffuse  RCA disease reaching 80% in the mid vessel and 80% in the distal vessel.  No good targets for CABG, and reviewed by interventional cardiology who thought no good interventional targets given absence of anginal pain.  8. 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  - Echo (11/22): EF 20-25%, severe LV dysfunction with global HK, moderate LVH, grade III DD (restrictive), RV severely reduced, moderate MR  - RHC (1/23): mean RA 8, PA 54/17 mean 32, mean PCWP 18, CI 2.54, PVR 2.8 WU  (pulmonary venous hypertension).  - Echo (4/23): EF 25-30% with moderate LVH and normal RV, mild MR. - Echo (8/23): EF 35% with diffuse hypokinesis and akinesis of the mid inferolateral and mid anterolateral walls, normal RV.  - Cardiac MRI (11/23): LV EF 42% with RV EF 52%, there was >50% wall thickness subendocardial LGE in the mid-apical inferolateral and anterolateral walls suggestive of prior MI.  - Echo (2/24): EF 55-60%, mild LVH, normal RV.  9. OSA: Mild on 2/23 sleep study.  10. Left renal mass: Likely cyst by renal US.  11. CVA: 1/24, ?related to atrial fibrillation. 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 - Carotid dopplers (2/24) with mild disease.  12. Atrial fibrillation: Paroxysmal.  Somewhat questionable diagnosis at time of CVA in 1/24.   Current Outpatient Medications  Medication Sig Dispense Refill   aspirin (ASPIRIN CHILDRENS) 81 MG chewable tablet Chew 1 tablet (81 mg total) by mouth daily. 30 tablet 11   atorvastatin (LIPITOR) 80 MG tablet Take 1 tablet (80 mg total) by mouth daily. 30 tablet 3   Blood Glucose Monitoring Suppl (ACCU-CHEK GUIDE) w/Device KIT Use as directed twice daily. 1 kit 0   carvedilol (COREG) 12.5 MG tablet Take 1 tablet (12.5 mg total) by mouth 2 (two) times daily with a meal. 180 tablet 3   empagliflozin (JARDIANCE) 10 MG TABS tablet Take 1 tablet (10 mg total) by mouth daily before breakfast. 30 tablet 3   glipiZIDE (GLUCOTROL) 10 MG tablet Take 1 tablet (10 mg total) by mouth daily before breakfast. 90 tablet 1   glucose blood test strip Use twice daily to check blood sugars 100 each 12   hydrALAZINE (APRESOLINE) 50 MG tablet Take 1 tablet (50 mg total) by mouth 3 (three) times daily. 90 tablet 3   insulin glargine, 1 Unit Dial, (TOUJEO SOLOSTAR) 300 UNIT/ML Solostar Pen Inject 15 Units into the skin daily. 4.5 mL 3   Insulin Pen Needle (UNIFINE PENTIPS) 32G X 4 MM MISC Use daily with insulin 100 each 2   isosorbide mononitrate (IMDUR) 30 MG  24 hr tablet Take 1 tablet (30 mg total) by mouth daily. 30 tablet 3   Lancets Micro Thin 33G MISC Use to check blood sugar twice daily 100 each 1   metFORMIN (GLUCOPHAGE-XR) 500 MG 24 hr tablet Take 1 tablet (500 mg total) by mouth 2 (two) times daily. 180 tablet 1   sacubitril-valsartan (ENTRESTO) 97-103 MG Take 1 tablet by mouth 2 (two) times daily. 180 tablet 3   spironolactone (ALDACTONE) 25 MG tablet Take 0.5 tablets (12.5 mg total) by mouth daily. 45 tablet 3   triamcinolone cream (KENALOG) 0.1 % Apply 1 Application topically 2 (two) times daily. 30 g 0   No current facility-administered medications for this encounter.   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 Pt smoked a pack a week  Vaping Use   Vaping Use: Never used  Substance and Sexual Activity   Alcohol use: Not Currently   Drug use: Never   Sexual activity: Not on file  Other Topics Concern   Not on file  Social History Narrative   Not on file   Social Determinants of Health   Financial Resource Strain: High Risk (02/12/2022)   Overall Financial Resource Strain (CARDIA)    Difficulty of Paying Living Expenses: Very hard  Food Insecurity: Food Insecurity Present (06/20/2021)   Hunger Vital Sign    Worried About Running Out of Food in the Last Year: Never true    Ran Out of Food in the Last Year: Sometimes true  Transportation Needs: No Transportation Needs  (06/20/2021)   PRAPARE - Hydrologist (Medical): No    Lack of Transportation (Non-Medical): No  Physical Activity: Not on file  Stress: Not on file  Social Connections: Not on file  Intimate Partner Violence: Not on file   Family History  Problem Relation Age of Onset   Healthy Mother    BP (!) 140/94   Pulse 61   Ht 5\' 10"  (1.778 m)   Wt 92.4 kg (203 lb 9.6 oz)   SpO2 98%   BMI 29.21 kg/m   Wt Readings from Last 3 Encounters:  05/04/22 92.4 kg (203 lb 9.6 oz)  03/25/22 88.9 kg (196 lb)  03/04/22 85.3 kg (188 lb)   PHYSICAL EXAM: General: NAD Neck: No JVD, no thyromegaly or thyroid nodule.  Lungs: Clear to auscultation bilaterally with normal respiratory effort. CV: Nondisplaced PMI.  Heart regular S1/S2, no S3/S4, no murmur.  No peripheral edema.  No carotid bruit.  Normal pedal pulses.  Abdomen: Soft, nontender, no hepatosplenomegaly, no distention.  Skin: Intact without lesions or rashes.  Neurologic: Alert and oriented x 3.  Psych: Normal affect. Extremities: No clubbing or cyanosis.  HEENT: Normal.   ASSESSMENT & PLAN: 1. HTN: Long history of poor control.  Renal artery dopplers in 5/23 with mild renal artery stenosis. BP mildly elevated today.  - Restarting meds as below.   2. 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 Echo (11/22) w/ severe biventricular dysfunction, LVEF 20-25%, Mod LVH, GIIIDD (restrictive), RV severely reduced, diffuse HK. Underwent R/LHC (1/23) showing significant CAD but no good targets for CABG.  Echo in 4/23 showed EF persistently low at 25-30%. Echo 8/23 showed EF 35% with diffuse hypokinesis and akinesis of the mid inferolateral and mid anterolateral walls, normal RV.  Cardiac MRI in 11/23 showed LV EF 42% with RV EF 52%, there was >50% wall thickness subendocardial LGE in the mid-apical inferolateral and anterolateral walls suggestive of prior MI.  Interestingly, echo in 2/24  during admission for CVA showed LV EF up to 55-60% with normal RV (I reviewed and think this is fairly accurate).  NYHA class I, he  is not volume overloaded.  - Continue Jardiance 10 mg daily.  - Restart Coreg 6.25 mg bid.  - Continue Entresto 97/103 bid.  - He does not appear to need a diuretic.   - Continue hydralazine 50 mg tid (cannot tolerate higher dose).  - Restart Imdur 30 mg daily.    - He was on spironolactone in the past, can restart in future but will not do this today.  - EF is now out of ICD range.  - BMET/BNP today.  3. CAD: LHC (1/23) with diffuse disease occluded large ramus and moderate D1. 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 No good PCI options in setting of diffuse disease and no chest pain, and he does not have good targets for CABG (would be high risk CABG with low EF).  Medically manage. Denies chest pain.  - Stop ASA given Eliquis use.  - Continue atorvastatin.  4. CKD, Stage III: BMET today.  5. Hyperlipidemia: He is on atorvastatin.  - Triglycerides were high most recently, start Vascepa today.   - Check lipids in 2 months.   6. Atrial fibrillation: CVA in 2/24 and apparently atrial fibrillation was seen in the hospital.  All ECGs in system show NSR, one is labeled AF but is NSR.  I am not totally sure of this diagnosis, but given recent CVA, will keep him on Eliquis.  - Continue Eliquis. CBC today.  7. CVA: In 2/24, no residual neurological symptoms/signs.  ?AF related.  Carotid dopplers in 2/24 showed no significant stenosis.  - He will continue Eliquis.   Followup 1 month with APP.   Loralie Champagne 08/27/2022

## 2022-08-28 ENCOUNTER — Other Ambulatory Visit (HOSPITAL_COMMUNITY): Payer: Self-pay

## 2022-09-06 NOTE — Progress Notes (Unsigned)
CC: HFU/Establishment of Care  HPI:  [name] is a 62 y.o. with medical history of HTN, HLD, CKD3b, DMII, HFpEF, Afib, Hx of seizures presenting to [facility] for hospital follow up. Was admitted to 01/31-02/04 for acute metabolic encephalopathy. Was found to be in [facility] and found to have an acute stroke.   Please see problem-based list for further details, assessments, and plans.  Past Medical History:  Diagnosis Date   Diabetes mellitus without complication (HCC)    GERD (gastroesophageal reflux disease)    Hypertension     Current Outpatient Medications (Endocrine & Metabolic):    empagliflozin (JARDIANCE) 10 MG TABS tablet, Take 1 tablet (10 mg total) by mouth daily before breakfast.   insulin aspart (NOVOLOG) 100 UNIT/ML FlexPen, Inject 5 Units into the skin 3 (three) times daily with meals.   insulin glargine (LANTUS) 100 unit/mL SOPN, Inject 40 Units into the skin daily.   metFORMIN (GLUCOPHAGE-XR) 500 MG 24 hr tablet, Take 1 tablet (500 mg total) by mouth 2 (two) times daily.  Current Outpatient Medications (Cardiovascular):    atorvastatin (LIPITOR) 80 MG tablet, Take 1 tablet (80 mg total) by mouth daily.   carvedilol (COREG) 6.25 MG tablet, Take 1 tablet (6.25 mg total) by mouth 2 (two) times daily.   hydrALAZINE (APRESOLINE) 50 MG tablet, Take 1 tablet (50 mg total) by mouth 3 (three) times daily.   icosapent Ethyl (VASCEPA) 1 g capsule, Take 2 capsules (2 g total) by mouth 2 (two) times daily.   isosorbide mononitrate (IMDUR) 30 MG 24 hr tablet, Take 1 tablet (30 mg total) by mouth daily.   sacubitril-valsartan (ENTRESTO) 97-103 MG, Take 1 tablet by mouth 2 (two) times daily.    Current Outpatient Medications (Hematological):    apixaban (ELIQUIS) 5 MG TABS tablet, Take 1 tablet (5 mg total) by mouth 2 (two) times daily.  Current Outpatient Medications (Other):    Blood Glucose Monitoring Suppl (ACCU-CHEK GUIDE) w/Device KIT, USE AS DIRECTED   Blood Glucose  Monitoring Suppl (ACCU-CHEK GUIDE) w/Device KIT, Use as directed twice daily.   glucose blood test strip, Use twice daily to check blood sugars   Insulin Pen Needle (UNIFINE PENTIPS) 32G X 4 MM MISC, Use daily with insulin   Lancets Micro Thin 33G MISC, Use to check blood sugar twice daily   levETIRAcetam (KEPPRA) 250 MG tablet, Take 1 tablet (250 mg total) by mouth 2 (two) times daily.   triamcinolone cream (KENALOG) 0.1 %, Apply 1 Application topically 2 (two) times daily. (Patient not taking: Reported on 08/27/2022)  Review of Systems:  Review of system negative unless stated in the problem list or HPI.    Physical Exam:  There were no vitals filed for this visit.  Physical Exam General: NAD HENT: NCAT Lungs: CTAB, no wheeze, rhonchi or rales.  Cardiovascular: Normal heart sounds, no r/m/g, 2+ pulses in all extremities. No LE edema Abdomen: No TTP, normal bowel sounds MSK: No asymmetry or muscle atrophy.  Skin: no lesions noted on exposed skin Neuro: Alert and oriented x4. CN grossly intact Psych: Normal mood and normal affect   Assessment & Plan:   No problem-specific Assessment & Plan notes found for this encounter.   See Encounters Tab for problem based charting.  Patient discussed with Dr. {NAMES:[id number]::"Guilloud","Hoffman","Mullen","Narendra","Rodney","Schultz","Lau","Hatcher"} Rodney Schuller, MD Tillie Rung. [facility] Internal Medicine Residency, PGY-2   Acute metabolic encephalopathy [facility] T2DM Titrate insulin regimen as needed. Consider restarting previous medications including glipizide 10 mg daily which he  says worked very well for his glycemic control and that he has not had in some time.    Acute CVA Maximize risk factor modification.   Seizure  Ensure compliance with Keppra. If not already set up, would recommend referral to follow up with neurology.   AKI on CKD stage 3b Recheck BMP.   Atrial fibrillation Ensure compliance with eliquis.    HTN Restart spironlactone, imdur, and carvedilol as tolerated by blood pressure and heart rate.   HLD Ensure patient has refills of repatha and vascepa

## 2022-09-07 ENCOUNTER — Ambulatory Visit: Payer: Medicaid Other | Admitting: Internal Medicine

## 2022-09-07 ENCOUNTER — Encounter: Payer: Self-pay | Admitting: Internal Medicine

## 2022-09-07 ENCOUNTER — Other Ambulatory Visit: Payer: Self-pay

## 2022-09-07 VITALS — BP 137/77 | HR 63 | Temp 97.9°F | Ht 70.0 in | Wt 190.2 lb

## 2022-09-07 DIAGNOSIS — R569 Unspecified convulsions: Secondary | ICD-10-CM

## 2022-09-07 DIAGNOSIS — Z794 Long term (current) use of insulin: Secondary | ICD-10-CM

## 2022-09-07 DIAGNOSIS — I63512 Cerebral infarction due to unspecified occlusion or stenosis of left middle cerebral artery: Secondary | ICD-10-CM

## 2022-09-07 DIAGNOSIS — I5041 Acute combined systolic (congestive) and diastolic (congestive) heart failure: Secondary | ICD-10-CM | POA: Diagnosis not present

## 2022-09-07 DIAGNOSIS — I480 Paroxysmal atrial fibrillation: Secondary | ICD-10-CM

## 2022-09-07 DIAGNOSIS — I110 Hypertensive heart disease with heart failure: Secondary | ICD-10-CM | POA: Diagnosis not present

## 2022-09-07 DIAGNOSIS — R634 Abnormal weight loss: Secondary | ICD-10-CM

## 2022-09-07 DIAGNOSIS — I10 Essential (primary) hypertension: Secondary | ICD-10-CM | POA: Diagnosis not present

## 2022-09-07 DIAGNOSIS — E785 Hyperlipidemia, unspecified: Secondary | ICD-10-CM

## 2022-09-07 DIAGNOSIS — Z7984 Long term (current) use of oral hypoglycemic drugs: Secondary | ICD-10-CM

## 2022-09-07 DIAGNOSIS — F17210 Nicotine dependence, cigarettes, uncomplicated: Secondary | ICD-10-CM

## 2022-09-07 DIAGNOSIS — Z1159 Encounter for screening for other viral diseases: Secondary | ICD-10-CM

## 2022-09-07 DIAGNOSIS — E1165 Type 2 diabetes mellitus with hyperglycemia: Secondary | ICD-10-CM

## 2022-09-07 LAB — GLUCOSE, CAPILLARY
Glucose-Capillary: 62 mg/dL — ABNORMAL LOW (ref 70–99)
Glucose-Capillary: 87 mg/dL (ref 70–99)

## 2022-09-07 NOTE — Patient Instructions (Signed)
Mr.Chais L Allard, it was a pleasure seeing you today! You endorsed feeling well today. Below are some of the things we talked about this visit. We look forward to seeing you in the follow up appointment!  Today we discussed: Decrease your lantus from 40 units to 30 units. Continue novolog 5 units TID. Continue aspirin and eliquis.  We will check blood work today.  Continue to follow up your heart doctor.   I have ordered the following labs today:   Lab Orders         Glucose, capillary         Glucose, capillary       Referrals ordered today:   Referral Orders  No referral(s) requested today     I have ordered the following medication/changed the following medications:   Stop the following medications: There are no discontinued medications.   Start the following medications: No orders of the defined types were placed in this encounter.    Follow-up: 1 month follow up  Please make sure to arrive 15 minutes prior to your next appointment. If you arrive late, you may be asked to reschedule.   We look forward to seeing you next time. Please call our clinic at [phone] if you have any questions or concerns. The best time to call is Monday-Friday from 9am-4pm, but there is someone available 24/7. If after hours or the weekend, call the main hospital number and ask for the Internal Medicine Resident On-Call. If you need medication refills, please notify your pharmacy one week in advance and they will send [place] a request.  Thank you for letting us take part in your care. Wishing you the best!  Thank you, [name], MD

## 2022-09-08 ENCOUNTER — Other Ambulatory Visit: Payer: Self-pay

## 2022-09-08 ENCOUNTER — Other Ambulatory Visit (HOSPITAL_BASED_OUTPATIENT_CLINIC_OR_DEPARTMENT_OTHER): Payer: Self-pay

## 2022-09-08 DIAGNOSIS — Z8673 Personal history of transient ischemic attack (TIA), and cerebral infarction without residual deficits: Secondary | ICD-10-CM | POA: Insufficient documentation

## 2022-09-08 DIAGNOSIS — I639 Cerebral infarction, unspecified: Secondary | ICD-10-CM | POA: Insufficient documentation

## 2022-09-08 DIAGNOSIS — R569 Unspecified convulsions: Secondary | ICD-10-CM | POA: Insufficient documentation

## 2022-09-08 LAB — BMP8+ANION GAP
Anion Gap: 18 mmol/L (ref 10.0–18.0)
BUN/Creatinine Ratio: 10 (ref 10–24)
BUN: 17 mg/dL (ref 8–27)
CO2: 18 mmol/L — ABNORMAL LOW (ref 20–29)
Calcium: 9.5 mg/dL (ref 8.6–10.2)
Chloride: 106 mmol/L (ref 96–106)
Creatinine, Ser: 1.65 mg/dL — ABNORMAL HIGH (ref 0.76–1.27)
Glucose: 104 mg/dL — ABNORMAL HIGH (ref 70–99)
Potassium: 4.5 mmol/L (ref 3.5–5.2)
Sodium: 142 mmol/L (ref 134–144)
eGFR: 46 mL/min/{1.73_m2} — ABNORMAL LOW (ref >59)

## 2022-09-08 LAB — MICROALBUMIN / CREATININE URINE RATIO
Creatinine, Urine: 127.3 mg/dL
Microalb/Creat Ratio: 652 mg/g creat — ABNORMAL HIGH (ref 0–29)
Microalbumin, Urine: 830.3 ug/mL

## 2022-09-08 LAB — HCV INTERPRETATION

## 2022-09-08 LAB — TSH: TSH: 0.709 u[IU]/mL (ref 0.450–4.500)

## 2022-09-08 LAB — HCV AB W REFLEX TO QUANT PCR: HCV Ab: NONREACTIVE

## 2022-09-08 MED ORDER — SPIRONOLACTONE 25 MG PO TABS
25.0000 mg | ORAL_TABLET | Freq: Every day | ORAL | 11 refills | Status: DC
  Filled 2022-09-08: qty 30, 30d supply, fill #0

## 2022-09-08 MED ORDER — CLOPIDOGREL BISULFATE 75 MG PO TABS
75.0000 mg | ORAL_TABLET | Freq: Every day | ORAL | 0 refills | Status: AC
  Filled 2022-09-08: qty 21, 21d supply, fill #0

## 2022-09-08 MED ORDER — ASPIRIN 81 MG PO TBEC
81.0000 mg | DELAYED_RELEASE_TABLET | Freq: Every day | ORAL | 3 refills | Status: DC
  Filled 2022-09-08: qty 120, 120d supply, fill #0

## 2022-09-08 NOTE — Assessment & Plan Note (Signed)
Pt has HTN and is on Coreg 6.25 mg BID, Hydralazine 50 mg TID, Imdur 30 mg qd, Entresto 97-103 mg BID. BP above goal at 137/77. Pt medication was held at discharge but restarted by cardiology at the follow up visit. Spironolactone was not restarted. BMP with improved creatinine at 1.65 with GFR at 46 consistent with CKDIIIa. K improved to 4.5. Will restart his spironolactone at previous dose 25 mg qd. Will repeat BMP in one month.

## 2022-09-08 NOTE — Assessment & Plan Note (Signed)
Pt has HFmrEF with G1DD. He is on GDMT with the above regimen. He follows with cardiology. Euvolemic on exam. Will restart his spirolactone 25 mg qd. Advised to continue to follow up with cardiology.

## 2022-09-08 NOTE — Assessment & Plan Note (Signed)
Pt noted to have a seizure during his hospital stay and started on Keppra. Pt in the office denied he had a seizure but stated he was taking keppra. Advised the importance of taking keppra and abstaining from driving for 6 months to ensure the seizure activity does not recur. Pt stated he has follow up with neurology and I advised him to follow up with them.

## 2022-09-08 NOTE — Assessment & Plan Note (Signed)
Pt noted to have acute CVA on MRI with severe stenosis of the left MCA. Pt started on DAPT with plavix and aspirin. He was also started on eliquis for concern of afib and plavix was discontinued. Discussed with cardiology and evidence for atrial fibrillation low given hx of normal EKGs with only one that showed atrial fibrillation. More importantly the stroke was ischemic and not embolic in nature, so will restart DAPT. Will have pt follow up with neurology.

## 2022-09-08 NOTE — Assessment & Plan Note (Signed)
On Repatha injection every 2 weeks, on lipitor 80 mg qd, and Vascepa for his hypertriglyceridemia. Will continue all of these agents. LDL near goal at 73.

## 2022-09-08 NOTE — Assessment & Plan Note (Signed)
Appears the noted weight loss was a measurement error. Pt's weight around 190 since 2020 per chart review. It is 190 today. Given abnormality in EKG during hospitalization, and this concern TSH was obtained which was normal.

## 2022-09-08 NOTE — Assessment & Plan Note (Signed)
A1c >15.5. Pt reports seeing numbers as high as 600s for the past 2 months. He states he was taking glipizide that his previous PCP refilled for him. He was last seen by his PCP over a year ago. Pt admitted for AMS 2/2 to [facility]. He was discharged on lantus 40 mg qd, novolog 5 mg TID, metformin 500 mg BID. He does mention only using novolog once with his breakfast. States has been adherent and sugars better with CBG running from 98-150. He reports that his most common reading is around 125. He does report lows in the 60 a few times. In the clinic his sugar was noted to be 62. Will decrease lantus to 30 units and continue Novolog at 5 units TID. Will advise pt to gradually increase his metformin to 1000 mg BID. If sugars get more controlled can decrease insulin. Previous concern of unintentional weight loss so will hold off on GLP 1 agonist. Advised pt to call our clinic if observes lower glucose readings.   Addendum: Given significant proteinuria, will increase Jardiance to 25 mg qd.

## 2022-09-09 ENCOUNTER — Other Ambulatory Visit (HOSPITAL_BASED_OUTPATIENT_CLINIC_OR_DEPARTMENT_OTHER): Payer: Self-pay

## 2022-09-09 MED ORDER — EMPAGLIFLOZIN 25 MG PO TABS
25.0000 mg | ORAL_TABLET | Freq: Every day | ORAL | 2 refills | Status: DC
  Filled 2022-09-09: qty 30, 30d supply, fill #0
  Filled 2022-09-28 – 2022-10-01 (×2): qty 30, 30d supply, fill #1
  Filled 2022-11-10: qty 30, 30d supply, fill #2

## 2022-09-11 NOTE — Progress Notes (Signed)
Internal Medicine Clinic Attending  Case discussed with the resident at the time of the visit.  We reviewed the resident's history and exam and pertinent patient test results.  I agree with the assessment, diagnosis, and plan of care documented in the resident's note.  

## 2022-09-22 ENCOUNTER — Other Ambulatory Visit (HOSPITAL_BASED_OUTPATIENT_CLINIC_OR_DEPARTMENT_OTHER): Payer: Self-pay

## 2022-09-22 ENCOUNTER — Other Ambulatory Visit (HOSPITAL_COMMUNITY): Payer: Self-pay | Admitting: Cardiology

## 2022-09-24 NOTE — Progress Notes (Signed)
ADVANCED HF CLINIC NOTE   Primary Care: Pcp, No HF Cardiologist: Dr. Aundra Dubin  HPI: Rodney Schultz is a 62 y.o. AAM w/ h/o HTN, T2DM and HLD, admitted to [facility] w/ new systolic heart failure in 11/22.    He reports long history of poorly controlled HTN in the past. Baseline SBPs would average in the 170s. Denies ETOH use. Former smoker but has quit. No known family h/o CHF. No h/o ischemic-like CP. No known h/o COVID but reports having the flu prior to his 11/22 admission.     Admitted 11/22 w/ new CHF, hypertensive + AKI w/ SCr in the 2.0 range (previous baseline ~1.5). Echo w/ severe biventricular dysfunction, LVEF 20-25% with diffuse hypokinesis, mod LVH, GIIIDD (restrictive), RV severely reduced, moderate MR. R/LHC was deferred initially due to renal insufficiency. He was diuresed w/ IV Lasix. GDMT w/ hydralazine, Imdur and Coreg initiated. Discharge wt 177 lb. Referred to TOC.    Seen 1/23 in HF clinic to establish care for his CHF. R/LHC showed mildly elevated PCWP, pulmonary venous hypertension, preserved CO and diffuse CAD w/ occluded large ramus and moderate D1. 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 No good PCI options and he does not have good targets for CABG (would be high risk CABG with low EF).  Medically managed in setting of no anginal chest pain.   Echo in 4/23 showed EF 25-30% with moderate LVH and normal RV, mild MR. Echo 8/23 showed EF 35% with diffuse hypokinesis and akinesis of the mid inferolateral and mid anterolateral walls, normal RV.   Cardiac MRI in 11/23 showed LV EF 42% with RV EF 52%, there was >50% wall thickness subendocardial LGE in the mid-apical inferolateral and anterolateral walls suggestive of prior MI.   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  Carotid dopplers in 2/24 showed only mild stenosis.  He also had AKI on CKD stage 3.   Supposedly he had atrial fibrillation by ECG but the ECGs in the system all show NSR (though one is labeled atrial fibrillation).  He was started on apixaban.  Imdur, spironolactone, and Coreg were stopped.  Echo in 2/24 showed EF up to 55-60% with mild LVH and normal RV.   Unfortunately, despite a nebulous diagnosis of atrial fibrillation and multiple cardiac med adjustments, cardiology was not consulted during this admission.   Follow up 2/24 NYHA I. BP elevated, Coreg and Imdur restarted.   Today he returns for HF follow up. Overall feeling fine. He is not short of breath walking up steps. Denies palpitations, abnormal bleeding, CP, dizziness, edema, or PND/Orthopnea. Appetite ok. No fever or chills. Weight at home 188 pounds. Taking all medications, seems confused about what he is taking. BP this morning at home 134/80  ECG (personally reviewed): none ordered today  Labs (1/23): K 4.3, creatinine 1.95 Labs (2/23): K 4.4, creatinine 1.75 Labs (4/23): K 4.4, creatinine 2.27, BNP 45, LDL 123, TGs 409 Labs (6/23): K 5.0,  creatinine 1.7 Labs (9/23): K 5.0, creatinine 2.19 Labs (2/24): K 3.6, creatinine 1.88, TGs 514  PMH: 1. Type 2 diabetes 2. HTN: Poorly controlled.  - Renal artery dopplers: Mild renal artery stenosis.   3. GERD 4. CKD stage 3 5. Hyperlipidemia 6. Former smoker 7. CAD: LHC (1/23) with occluded large ramu, occluded moderate D2, 90% dLAD, 80% dLCx, diffuse RCA disease reaching 80% in the mid vessel  and 80% in the distal vessel.  No good targets for CABG, and reviewed by interventional cardiology who thought no good interventional targets given absence of anginal pain.  8. 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  - Echo (11/22): EF 20-25%, severe LV dysfunction with global HK, moderate LVH, grade III DD (restrictive), RV severely reduced, moderate MR  - RHC (1/23): mean RA 8, PA 54/17 mean 32, mean PCWP 18, CI 2.54, PVR 2.8 WU (pulmonary  venous hypertension).  - Echo (4/23): EF 25-30% with moderate LVH and normal RV, mild MR. - Echo (8/23): EF 35% with diffuse hypokinesis and akinesis of the mid inferolateral and mid anterolateral walls, normal RV.  - Cardiac MRI (11/23): LV EF 42% with RV EF 52%, there was >50% wall thickness subendocardial LGE in the mid-apical inferolateral and anterolateral walls suggestive of prior MI.  - Echo (2/24): EF 55-60%, mild LVH, normal RV.  9. OSA: Mild on 2/23 sleep study.  10. Left renal mass: Likely cyst by renal US.  11. CVA: 1/24, ?related to atrial fibrillation. 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 - Carotid dopplers (2/24) with mild disease.  12. Atrial fibrillation: Paroxysmal.  Somewhat questionable diagnosis at time of CVA in 1/24.   Current Outpatient Medications  Medication Sig Dispense Refill   aspirin EC 81 MG tablet Take 1 tablet (81 mg total) by mouth daily. Swallow whole. 90 tablet 3   atorvastatin (LIPITOR) 80 MG tablet Take 1 tablet (80 mg total) by mouth daily. 30 tablet 2   Blood Glucose Monitoring Suppl (ACCU-CHEK GUIDE) w/Device KIT Use as directed twice daily. 1 kit 0   Blood Glucose Monitoring Suppl (ACCU-CHEK GUIDE) w/Device KIT USE AS DIRECTED 1 kit 0   carvedilol (COREG) 6.25 MG tablet Take 1 tablet (6.25 mg total) by mouth 2 (two) times daily. 180 tablet 3   empagliflozin (JARDIANCE) 25 MG TABS tablet Take 1 tablet (25 mg total) by mouth daily before breakfast. 30 tablet 2   glucose blood test strip Use twice daily to check blood sugars 100 each 12   hydrALAZINE (APRESOLINE) 50 MG tablet Take 1 tablet (50 mg total) by mouth 3 (three) times daily. 90 tablet 3   icosapent Ethyl (VASCEPA) 1 g capsule Take 2 capsules (2 g total) by mouth 2 (two) times daily. 120 capsule 6   insulin aspart (NOVOLOG) 100 UNIT/ML FlexPen Inject 5 Units into the skin 3 (three) times daily with meals. 15 mL 11   insulin glargine (LANTUS) 100 unit/mL SOPN Inject 40 Units into the skin  daily. 15 mL 11   Insulin Pen Needle (UNIFINE PENTIPS) 32G X 4 MM MISC Use daily with insulin 100 each 2   isosorbide mononitrate (IMDUR) 30 MG 24 hr tablet Take 1 tablet (30 mg total) by mouth daily. 90 tablet 3   Lancets Micro Thin 33G MISC Use to check blood sugar twice daily 100 each 1   levETIRAcetam (KEPPRA) 250 MG tablet Take 1 tablet (250 mg total) by mouth 2 (two) times daily. 60 tablet 2   metFORMIN (GLUCOPHAGE-XR) 500 MG 24 hr tablet Take 1 tablet (500 mg total) by mouth 2 (two) times daily. 60 tablet 0   sacubitril-valsartan (ENTRESTO) 97-103 MG Take 1 tablet by mouth 2 (two) times daily. 180 tablet 3   spironolactone (ALDACTONE) 25 MG tablet Take 1 tablet (25 mg total) by mouth daily. 30 tablet 11   clopidogrel (PLAVIX) 75 MG tablet Take 1 tablet (75 mg total) by  mouth daily for 21 days. (Patient not taking: Reported on 09/25/2022) 21 tablet 0   triamcinolone cream (KENALOG) 0.1 % Apply 1 Application topically 2 (two) times daily. (Patient taking differently: Apply 1 Application topically 2 (two) times daily. As needed) 30 g 0   No current facility-administered medications for this encounter.   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, Engineer, civil (consulting) "yard dog"  Tobacco Use   Smoking status: Every Day    Packs/day: 0.10    Years: 35.00    Total pack years: 3.50    Types: Cigarettes    Start date: 09/02/2021   Smokeless tobacco: Never   Tobacco comments:    1 cig per day  Vaping Use   Vaping Use: Never used  Substance and Sexual Activity   Alcohol use: Not Currently   Drug use: Never   Sexual activity: Not on file  Other Topics Concern   Not on file  Social  History Narrative   Not on file   Social Determinants of Health   Financial Resource Strain: Low Risk  (09/07/2022)   Overall Financial Resource Strain (CARDIA)    Difficulty of Paying Living Expenses: Not hard at all  Recent Concern: Financial Resource Strain - High Risk (08/27/2022)   Overall Financial Resource Strain (CARDIA)    Difficulty of Paying Living Expenses: Very hard  Food Insecurity: Food Insecurity Present (09/07/2022)   Hunger Vital Sign    Worried About [place] in the Last Year: Sometimes true    Ran Out of Food in the Last Year: Sometimes true  Transportation Needs: No Transportation Needs (09/07/2022)   PRAPARE - Hydrologist (Medical): No    Lack of Transportation (Non-Medical): No  Physical Activity: Not on file  Stress: Not on file  Social Connections: Moderately Isolated (09/07/2022)   Social Connection and Isolation Panel [NHANES]    Frequency of Communication with Friends and Family: Once a week    Frequency of Social Gatherings with Friends and Family: Once a week    Attends Religious Services: More than 4 times per year    Active Member of [employer] or Organizations: No    Attends Archivist Meetings: Never    Marital Status: Married  Human resources officer Violence: Not At Risk (09/07/2022)   Humiliation, Afraid, Rape, and Kick questionnaire    Fear of Current or Ex-Partner: No    Emotionally Abused: No    Physically Abused: No    Sexually Abused: No   Family History  Problem Relation Age of Onset   Healthy Mother    BP (!) 156/96   Pulse 76   Wt 89.4 kg (197 lb 3.2 oz)   SpO2 100%   BMI 28.30 kg/m   Wt Readings from Last 3 Encounters:  09/25/22 89.4 kg (197 lb 3.2 oz)  09/07/22 86.3 kg (190 lb 3.2 oz)  08/27/22 84.6 kg (186 lb 9.6 oz)   PHYSICAL EXAM: General:  NAD. No resp difficulty, walked into clinic HEENT: Normal Neck: Supple. No JVD. Carotids 2+ bilat; no bruits. No lymphadenopathy or  thryomegaly  appreciated. Cor: PMI nondisplaced. Regular rate & rhythm. No rubs, gallops or murmurs. Lungs: Clear Abdomen: Soft, nontender, nondistended. No hepatosplenomegaly. No bruits or masses. Good bowel sounds. Extremities: No cyanosis, clubbing, rash, edema Neuro: Alert & oriented x 3, cranial nerves grossly intact. Moves all 4 extremities w/o difficulty. Affect pleasant.  ASSESSMENT & PLAN: 1. HTN: Long history of poor control.  Renal artery dopplers in 5/23 with mild renal artery stenosis. BP mildly elevated today, but generally well controlled on home check. - Meds as above.   2. 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 Echo (11/22) w/ severe biventricular dysfunction, LVEF 20-25%, Mod LVH, GIIIDD (restrictive), RV severely reduced, diffuse HK. Underwent R/LHC (1/23) showing significant CAD but no good targets for CABG.  Echo in 4/23 showed EF persistently low at 25-30%. Echo 8/23 showed EF 35% with diffuse hypokinesis and akinesis of the mid inferolateral and mid anterolateral walls, normal RV.  Cardiac MRI in 11/23 showed LV EF 42% with RV EF 52%, there was >50% wall thickness subendocardial LGE in the mid-apical inferolateral and anterolateral walls suggestive of prior MI.  Interestingly, echo in 2/24 during admission for CVA showed LV EF up to 55-60% with normal RV (Dr. Aundra Dubin reviewed and thinks this is fairly accurate).  NYHA class I, he is not volume overloaded.  - Continue Jardiance 25 mg daily. No GU symptoms - Continue Coreg 6.25 mg bid.  - Continue Entresto 97/103 bid.  - Continue spiro 25 mg daily. BMET today. - He does not appear to need a diuretic.   - Continue hydralazine 50 mg tid (cannot tolerate higher dose) + Imdur 30 mg daily. - EF is now out of ICD range.  3. CAD: LHC (1/23) with diffuse disease occluded large ramus and moderate D1. 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 No good PCI options in setting of diffuse  disease and no chest pain, and he does not have good targets for CABG (would be high risk CABG with low EF).  Medically manage. Denies chest pain.  - Stop ASA given Eliquis use.  - Continue atorvastatin.  4. CKD, Stage III: BMET today.  5. Hyperlipidemia: He is on atorvastatin.  - Triglycerides were high most recently, continue Vascepa (this was recently started). - Check lipids next month. 6. Atrial fibrillation: CVA in 2/24 and apparently atrial fibrillation was seen in the hospital.  All ECGs in system show NSR, one is labeled AF but is NSR.  I am not totally sure of this diagnosis, but given recent CVA, will keep him on Eliquis.  - Continue Eliquis. No bleeding issues. Recent CBC stable. 7. CVA: In 2/24, no residual neurological symptoms/signs.  ?AF related.  Carotid dopplers in 2/24 showed no significant stenosis.  - He will continue Eliquis.   Follow up in 3-4 months with Dr. Wynema Birch [facility] FNP-BC 09/25/2022

## 2022-09-25 ENCOUNTER — Ambulatory Visit (HOSPITAL_COMMUNITY)
Admission: RE | Admit: 2022-09-25 | Discharge: 2022-09-25 | Disposition: A | Discharge status: Home / Self Care | Payer: Medicaid Other | Source: Ambulatory Visit | Attending: Family Medicine | Admitting: Family Medicine

## 2022-09-25 ENCOUNTER — Encounter (HOSPITAL_COMMUNITY): Payer: Self-pay

## 2022-09-25 VITALS — BP 156/96 | HR 76 | Wt 197.2 lb

## 2022-09-25 DIAGNOSIS — Z79899 Other long term (current) drug therapy: Secondary | ICD-10-CM | POA: Insufficient documentation

## 2022-09-25 DIAGNOSIS — Z8673 Personal history of transient ischemic attack (TIA), and cerebral infarction without residual deficits: Secondary | ICD-10-CM | POA: Diagnosis not present

## 2022-09-25 DIAGNOSIS — I480 Paroxysmal atrial fibrillation: Secondary | ICD-10-CM | POA: Diagnosis not present

## 2022-09-25 DIAGNOSIS — E785 Hyperlipidemia, unspecified: Secondary | ICD-10-CM | POA: Diagnosis not present

## 2022-09-25 DIAGNOSIS — I272 Pulmonary hypertension, unspecified: Secondary | ICD-10-CM | POA: Diagnosis not present

## 2022-09-25 DIAGNOSIS — I5022 Chronic systolic (congestive) heart failure: Secondary | ICD-10-CM | POA: Insufficient documentation

## 2022-09-25 DIAGNOSIS — N1832 Chronic kidney disease, stage 3b: Secondary | ICD-10-CM | POA: Diagnosis not present

## 2022-09-25 DIAGNOSIS — I252 Old myocardial infarction: Secondary | ICD-10-CM | POA: Insufficient documentation

## 2022-09-25 DIAGNOSIS — I130 Hypertensive heart and chronic kidney disease with heart failure and stage 1 through stage 4 chronic kidney disease, or unspecified chronic kidney disease: Secondary | ICD-10-CM | POA: Insufficient documentation

## 2022-09-25 DIAGNOSIS — I701 Atherosclerosis of renal artery: Secondary | ICD-10-CM | POA: Insufficient documentation

## 2022-09-25 DIAGNOSIS — N183 Chronic kidney disease, stage 3 unspecified: Secondary | ICD-10-CM | POA: Insufficient documentation

## 2022-09-25 DIAGNOSIS — I10 Essential (primary) hypertension: Secondary | ICD-10-CM

## 2022-09-25 DIAGNOSIS — I2510 Atherosclerotic heart disease of native coronary artery without angina pectoris: Secondary | ICD-10-CM | POA: Insufficient documentation

## 2022-09-25 DIAGNOSIS — Z7984 Long term (current) use of oral hypoglycemic drugs: Secondary | ICD-10-CM | POA: Insufficient documentation

## 2022-09-25 DIAGNOSIS — Z794 Long term (current) use of insulin: Secondary | ICD-10-CM | POA: Insufficient documentation

## 2022-09-25 DIAGNOSIS — Z7901 Long term (current) use of anticoagulants: Secondary | ICD-10-CM | POA: Diagnosis not present

## 2022-09-25 DIAGNOSIS — E1122 Type 2 diabetes mellitus with diabetic chronic kidney disease: Secondary | ICD-10-CM | POA: Diagnosis not present

## 2022-09-25 DIAGNOSIS — I639 Cerebral infarction, unspecified: Secondary | ICD-10-CM

## 2022-09-25 LAB — BASIC METABOLIC PANEL
Anion gap: 7 (ref 5–15)
BUN: 22 mg/dL (ref 8–23)
CO2: 24 mmol/L (ref 22–32)
Calcium: 9 mg/dL (ref 8.9–10.3)
Chloride: 109 mmol/L (ref 98–111)
Creatinine, Ser: 1.85 mg/dL — ABNORMAL HIGH (ref 0.61–1.24)
GFR, Estimated: 40 mL/min — ABNORMAL LOW (ref >60)
Glucose, Bld: 112 mg/dL — ABNORMAL HIGH (ref 70–99)
Potassium: 4 mmol/L (ref 3.5–5.1)
Sodium: 140 mmol/L (ref 135–145)

## 2022-09-25 NOTE — Patient Instructions (Addendum)
Thank you for coming in today  Labs were done today, if any labs are abnormal the clinic will call you No news is good news  Your pharmacy is already set for [place] you will just need to call [facility] and have all your prescriptions switched over.   Medications: STOP Aspirin   Follow up appointments:  Your physician recommends that you schedule a follow-up appointment in:  3-4 months with Dr. Kendall Flack will receive a reminder letter in the mail a few months in advance. If you don't receive a letter, please call our office to schedule the follow-up appointment.     Do the following things EVERYDAY: Weigh yourself in the morning before breakfast. Write it down and keep it in a log. Take your medicines as prescribed Eat low salt foods--Limit salt (sodium) to 2000 mg per day.  Stay as active as you can everyday Limit all fluids for the day to less than 2 liters   At the [facility], you and your health needs are our priority. As part of our continuing mission to provide you with exceptional heart care, we have created designated Provider Care Teams. These Care Teams include your primary Cardiologist (physician) and Advanced Practice Providers (APPs- Physician Assistants and Nurse Practitioners) who all work together to provide you with the care you need, when you need it.   You may see any of the following providers on your designated Care Team at your next follow up: Dr Glori Bickers Dr Loralie Champagne Dr. Roxana Hires, NP Lyda Jester, [place] [facility] [place], [place] Forestine Na, NP Audry Riles, PharmD   Please be sure to bring in all your medications bottles to every appointment.    Thank you for choosing [facility]  If you have any questions or concerns before your next appointment please send [place] a message through [place] or call our office at [phone].    TO LEAVE  A MESSAGE FOR THE NURSE SELECT OPTION 2, PLEASE LEAVE A MESSAGE INCLUDING: YOUR NAME DATE OF BIRTH CALL BACK NUMBER REASON FOR CALL**this is important as we prioritize the call backs  YOU WILL RECEIVE A CALL BACK THE SAME DAY AS LONG AS YOU CALL BEFORE 4:00 PM

## 2022-09-28 ENCOUNTER — Other Ambulatory Visit: Payer: Self-pay

## 2022-09-28 ENCOUNTER — Other Ambulatory Visit: Payer: Self-pay | Admitting: Internal Medicine

## 2022-09-28 ENCOUNTER — Other Ambulatory Visit (HOSPITAL_BASED_OUTPATIENT_CLINIC_OR_DEPARTMENT_OTHER): Payer: Self-pay

## 2022-09-28 MED ORDER — APIXABAN 5 MG PO TABS
5.0000 mg | ORAL_TABLET | Freq: Two times a day (BID) | ORAL | 2 refills | Status: DC
  Filled 2022-09-28: qty 60, 30d supply, fill #0
  Filled 2022-11-17: qty 60, 30d supply, fill #1

## 2022-09-28 MED ORDER — ATORVASTATIN CALCIUM 80 MG PO TABS
80.0000 mg | ORAL_TABLET | Freq: Every day | ORAL | 2 refills | Status: DC
  Filled 2022-09-28: qty 30, 30d supply, fill #0
  Filled 2022-11-10: qty 30, 30d supply, fill #1
  Filled 2022-12-21: qty 30, 30d supply, fill #2

## 2022-09-30 ENCOUNTER — Other Ambulatory Visit (HOSPITAL_BASED_OUTPATIENT_CLINIC_OR_DEPARTMENT_OTHER): Payer: Self-pay

## 2022-10-01 ENCOUNTER — Other Ambulatory Visit: Payer: Self-pay

## 2022-10-01 ENCOUNTER — Other Ambulatory Visit (HOSPITAL_BASED_OUTPATIENT_CLINIC_OR_DEPARTMENT_OTHER): Payer: Self-pay

## 2022-10-05 ENCOUNTER — Other Ambulatory Visit: Payer: Self-pay | Admitting: Internal Medicine

## 2022-10-05 DIAGNOSIS — E1165 Type 2 diabetes mellitus with hyperglycemia: Secondary | ICD-10-CM

## 2022-10-05 NOTE — Telephone Encounter (Signed)
Refill Request   insulin glargine (LANTUS) 100 unit/mL SOPN   [id number] - [place], [place] - [address] AT [address]

## 2022-10-07 ENCOUNTER — Other Ambulatory Visit: Payer: Self-pay | Admitting: Internal Medicine

## 2022-10-07 ENCOUNTER — Other Ambulatory Visit (HOSPITAL_BASED_OUTPATIENT_CLINIC_OR_DEPARTMENT_OTHER): Payer: Self-pay

## 2022-10-07 DIAGNOSIS — E1165 Type 2 diabetes mellitus with hyperglycemia: Secondary | ICD-10-CM

## 2022-10-07 MED ORDER — INSULIN GLARGINE 100 UNITS/ML SOLOSTAR PEN: 40.0000 [IU] | PEN_INJECTOR | Freq: Every day | SUBCUTANEOUS | 11 refills | Status: DC

## 2022-10-07 MED ORDER — UNIFINE PENTIPS 32G X 4 MM MISC: 2 refills | Status: DC

## 2022-10-07 MED ORDER — INSULIN GLARGINE 100 UNIT/ML SOLOSTAR PEN: 40.0000 [IU] | PEN_INJECTOR | Freq: Every day | SUBCUTANEOUS | 8 refills | Status: DC

## 2022-10-09 ENCOUNTER — Other Ambulatory Visit (HOSPITAL_BASED_OUTPATIENT_CLINIC_OR_DEPARTMENT_OTHER): Payer: Self-pay

## 2022-10-09 ENCOUNTER — Other Ambulatory Visit: Payer: Self-pay

## 2022-10-09 DIAGNOSIS — E1165 Type 2 diabetes mellitus with hyperglycemia: Secondary | ICD-10-CM

## 2022-10-09 MED ORDER — INSULIN GLARGINE 100 UNIT/ML SOLOSTAR PEN
40.0000 [IU] | PEN_INJECTOR | Freq: Every day | SUBCUTANEOUS | 8 refills | Status: DC
  Filled 2022-10-09 (×2): qty 15, 37d supply, fill #0
  Filled 2022-11-17: qty 15, 37d supply, fill #1

## 2022-10-09 NOTE — Telephone Encounter (Signed)
Call from patient still has not gotten his Lantus.  Was to be sent to the [facility] at [place].  Call to Pharmacy at [facility].  Unable to fill as it is showing to early.  Call to [place] prescription is there waiting for pick up.  Asked  to put back in stock so that Lantus prescription can be picked at the [facility] on [address].  Call to [place] to see if they are able to run the fill on the Lantus there.  Spoke with Pharmacist at [facility] are filling prescription for patient now.  Call to patient to inform him of the refill on his Lantus.

## 2022-10-26 ENCOUNTER — Ambulatory Visit (HOSPITAL_COMMUNITY)
Admission: RE | Admit: 2022-10-26 | Discharge: 2022-10-26 | Disposition: A | Discharge status: Home / Self Care | Payer: Medicaid Other | Source: Ambulatory Visit | Attending: Family Medicine | Admitting: Family Medicine

## 2022-10-26 ENCOUNTER — Telehealth (HOSPITAL_COMMUNITY): Payer: Self-pay

## 2022-10-26 DIAGNOSIS — I5022 Chronic systolic (congestive) heart failure: Secondary | ICD-10-CM

## 2022-10-26 LAB — LIPID PANEL
Cholesterol: 155 mg/dL (ref 0–200)
HDL: 26 mg/dL — ABNORMAL LOW (ref >40)
LDL Cholesterol: 72 mg/dL (ref 0–99)
Total CHOL/HDL Ratio: 6 RATIO
Triglycerides: 287 mg/dL — ABNORMAL HIGH (ref <150)
VLDL: 57 mg/dL — ABNORMAL HIGH (ref 0–40)

## 2022-10-26 NOTE — Telephone Encounter (Signed)
Patient aware of cholesterol labs

## 2022-11-03 ENCOUNTER — Other Ambulatory Visit: Payer: Self-pay | Admitting: Pharmacist

## 2022-11-03 NOTE — Progress Notes (Signed)
Patient outreached by Michiel Cowboy, PharmD Candidate on 11/03/2022 to discuss hypertension.   Patient has an automated home blood pressure machine. They report home readings ~140s/90s mmHg.   Medication review was performed. They are generally taking medications as prescribed. Differences from their prescribed list include: hydralazine 50 mg BID instead of TID.   The following barriers to adherence were noted:  - They do not have cost concerns.  - They do not have transportation concerns.  - They do not need assistance obtaining refills.  - They do not occasionally forget to take some of their prescribed medications.  - They do not feel like one/some of their medications make them feel poorly.  - They do not have questions or concerns about their medications.  - They do have follow up scheduled with their primary care provider/cardiologist.   The following interventions were completed:  - Medications were reviewed  - Patient was educated on goal blood pressures and long term health implications of elevated blood pressure  - Patient was educated on proper technique to check home blood pressure and reminded to bring home machine and readings to next provider appointment  - Patient was counseled on lifestyle modifications to improve blood pressure, including dietary improvements (avoiding excess salt, caffeine, processed foods) and increased physical activity.   The patient does not have follow up scheduled:  - Patient advised to reach out to PCP's office to schedule follow-up appointment.    Michiel Cowboy, PharmD Candidate   Will collaborate with office staff to outreach patient.   Catie Eppie Gibson, PharmD, BCACP, CPP [facility] [phone]

## 2022-11-10 ENCOUNTER — Other Ambulatory Visit (HOSPITAL_COMMUNITY): Payer: Self-pay | Admitting: Internal Medicine

## 2022-11-10 ENCOUNTER — Other Ambulatory Visit: Payer: Self-pay

## 2022-11-10 ENCOUNTER — Other Ambulatory Visit: Payer: Self-pay | Admitting: Internal Medicine

## 2022-11-10 ENCOUNTER — Other Ambulatory Visit (HOSPITAL_BASED_OUTPATIENT_CLINIC_OR_DEPARTMENT_OTHER): Payer: Self-pay

## 2022-11-11 ENCOUNTER — Encounter (HOSPITAL_BASED_OUTPATIENT_CLINIC_OR_DEPARTMENT_OTHER): Payer: Self-pay | Admitting: Pharmacist

## 2022-11-11 ENCOUNTER — Other Ambulatory Visit (HOSPITAL_BASED_OUTPATIENT_CLINIC_OR_DEPARTMENT_OTHER): Payer: Self-pay

## 2022-11-11 NOTE — Progress Notes (Deleted)
   CC: PCP follow up  HPI:  [name] is a 62 y.o. with medical history of HTN, HLD, CAD, CKD3b, DMII, HFmrEF, Afib, Hx of seizures presenting for PCP follow up.   Please see problem-based list for further details, assessments, and plans.  Past Medical History:  Diagnosis Date   Diabetes mellitus without complication (HCC)    GERD (gastroesophageal reflux disease)    Hypertension     Current Outpatient Medications (Endocrine & Metabolic):    empagliflozin (JARDIANCE) 25 MG TABS tablet, Take 1 tablet (25 mg total) by mouth daily before breakfast.   insulin aspart (NOVOLOG) 100 UNIT/ML FlexPen, Inject 5 Units into the skin 3 (three) times daily with meals.   insulin glargine (LANTUS) 100 UNIT/ML Solostar Pen, Inject 40 Units into the skin daily.   metFORMIN (GLUCOPHAGE-XR) 500 MG 24 hr tablet, Take 1 tablet (500 mg total) by mouth 2 (two) times daily.  Current Outpatient Medications (Cardiovascular):    atorvastatin (LIPITOR) 80 MG tablet, Take 1 tablet (80 mg total) by mouth daily.   carvedilol (COREG) 6.25 MG tablet, Take 1 tablet (6.25 mg total) by mouth 2 (two) times daily.   hydrALAZINE (APRESOLINE) 50 MG tablet, Take 1 tablet (50 mg total) by mouth 3 (three) times daily.   icosapent Ethyl (VASCEPA) 1 g capsule, Take 2 capsules (2 g total) by mouth 2 (two) times daily.   isosorbide mononitrate (IMDUR) 30 MG 24 hr tablet, Take 1 tablet (30 mg total) by mouth daily.   sacubitril-valsartan (ENTRESTO) 97-103 MG, Take 1 tablet by mouth 2 (two) times daily.   spironolactone (ALDACTONE) 25 MG tablet, Take 1 tablet (25 mg total) by mouth daily.    Current Outpatient Medications (Hematological):    apixaban (ELIQUIS) 5 MG TABS tablet, Take 1 tablet (5 mg total) by mouth 2 (two) times daily.  Current Outpatient Medications (Other):    Blood Glucose Monitoring Suppl (ACCU-CHEK GUIDE) w/Device KIT, Use as directed twice daily.   Blood Glucose Monitoring Suppl (ACCU-CHEK GUIDE)  w/Device KIT, USE AS DIRECTED   glucose blood test strip, Use twice daily to check blood sugars   Insulin Pen Needle (UNIFINE PENTIPS) 32G X 4 MM MISC, Use daily with insulin   Lancets Micro Thin 33G MISC, Use to check blood sugar twice daily   levETIRAcetam (KEPPRA) 250 MG tablet, Take 1 tablet (250 mg total) by mouth 2 (two) times daily.   triamcinolone cream (KENALOG) 0.1 %, Apply 1 Application topically 2 (two) times daily. (Patient taking differently: Apply 1 Application topically 2 (two) times daily. As needed)  Review of Systems:  Review of system negative unless stated in the problem list or HPI.    Physical Exam:  There were no vitals filed for this visit. Physical Exam General: NAD HENT: NCAT Lungs: CTAB, no wheeze, rhonchi or rales.  Cardiovascular: Normal heart sounds, no r/m/g, 2+ pulses in all extremities. No LE edema Abdomen: No TTP, normal bowel sounds MSK: No asymmetry or muscle atrophy.  Skin: no lesions noted on exposed skin Neuro: Alert and oriented x4. CN grossly intact Psych: Normal mood and normal affect   Assessment & Plan:   No problem-specific Assessment & Plan notes found for this encounter.   See Encounters Tab for problem based charting.  Patient Discussed with Dr. {NAMES:[id number]::"Guilloud","Hoffman","Mullen","Narendra","Vincent","Machen","Lau","Hatcher","Williams"} Gwenevere Abbot, MD Eligha Bridegroom. [facility], PGY-2

## 2022-11-12 ENCOUNTER — Encounter: Payer: Medicaid Other | Admitting: Internal Medicine

## 2022-11-17 ENCOUNTER — Other Ambulatory Visit: Payer: Self-pay | Admitting: Internal Medicine

## 2022-11-17 ENCOUNTER — Other Ambulatory Visit (HOSPITAL_BASED_OUTPATIENT_CLINIC_OR_DEPARTMENT_OTHER): Payer: Self-pay

## 2022-11-18 ENCOUNTER — Other Ambulatory Visit: Payer: Self-pay

## 2022-11-18 ENCOUNTER — Other Ambulatory Visit (HOSPITAL_BASED_OUTPATIENT_CLINIC_OR_DEPARTMENT_OTHER): Payer: Self-pay

## 2022-11-18 ENCOUNTER — Ambulatory Visit (INDEPENDENT_AMBULATORY_CARE_PROVIDER_SITE_OTHER): Payer: Medicaid Other

## 2022-11-18 VITALS — BP 138/84 | HR 67 | Temp 98.3°F | Ht 70.0 in | Wt 199.7 lb

## 2022-11-18 DIAGNOSIS — I110 Hypertensive heart disease with heart failure: Secondary | ICD-10-CM

## 2022-11-18 DIAGNOSIS — I10 Essential (primary) hypertension: Secondary | ICD-10-CM | POA: Diagnosis present

## 2022-11-18 DIAGNOSIS — I5041 Acute combined systolic (congestive) and diastolic (congestive) heart failure: Secondary | ICD-10-CM | POA: Diagnosis not present

## 2022-11-18 DIAGNOSIS — I63512 Cerebral infarction due to unspecified occlusion or stenosis of left middle cerebral artery: Secondary | ICD-10-CM | POA: Diagnosis not present

## 2022-11-18 DIAGNOSIS — E1165 Type 2 diabetes mellitus with hyperglycemia: Secondary | ICD-10-CM

## 2022-11-18 DIAGNOSIS — Z0000 Encounter for general adult medical examination without abnormal findings: Secondary | ICD-10-CM | POA: Insufficient documentation

## 2022-11-18 DIAGNOSIS — E785 Hyperlipidemia, unspecified: Secondary | ICD-10-CM

## 2022-11-18 MED ORDER — GLUCOSE BLOOD VI STRP
ORAL_STRIP | 12 refills | Status: DC
  Filled 2022-11-18: qty 100, 30d supply, fill #0

## 2022-11-18 MED ORDER — TORSEMIDE 20 MG PO TABS
20.0000 mg | ORAL_TABLET | Freq: Every day | ORAL | 2 refills | Status: DC | PRN
  Filled 2022-11-23: qty 30, 30d supply, fill #0
  Filled 2023-04-27: qty 30, 30d supply, fill #1
  Filled 2023-10-07: qty 30, 30d supply, fill #2

## 2022-11-18 MED ORDER — METFORMIN HCL ER 500 MG PO TB24
1000.0000 mg | ORAL_TABLET | Freq: Two times a day (BID) | ORAL | 2 refills | Status: DC
  Filled 2022-11-18: qty 120, 30d supply, fill #0

## 2022-11-18 NOTE — Assessment & Plan Note (Addendum)
He does not believe he had a stroke. He thinks he was told to take both asa and eliquis, but cardiology notes mention to take just eliquis. No new FND. - advised to continue just eliquis. He wants to check with cardiology about this.

## 2022-11-18 NOTE — Assessment & Plan Note (Addendum)
On Coreg 6.25 mg BID, Hydralazine 50 mg TID, Imdur 30 mg qd, Entresto 97-103 mg BID. Spironolactone 25 added at last visit. BP 138/84 at this visit, asymptomatic. Of note, dispenses do not match with his reports of what he is taking, for example coreg/imdur dispensed last in October of last year. Will see if we can get pharmacy review before making changes at this time. -continue current regimen -referral to clinical pharmD for review of dispenses and adherence

## 2022-11-18 NOTE — Assessment & Plan Note (Signed)
Some inconsistencies between his adherence history and dispense records, will double check with PharmD review -clinical pharmacist referral

## 2022-11-18 NOTE — Assessment & Plan Note (Addendum)
Triglycerides mildly elevated at last check on lipitor 80 and vascepa 2g BID. He was not fasting at that time but is fasting today. -lipid panel today

## 2022-11-18 NOTE — Assessment & Plan Note (Addendum)
A1c 15 at last check in early February. He says he checks his sugars every day and that his numbers are typically 90s-110s, though he does sometimes get lows in the 60s every couple of days and feels weak when this happens. Taking metformin XR 1000 BID, lantus 30, novolog 5 but only takes it once a day with breakfast, jardiance 25mg . Didn't bring meter today. -continue metformin XR 1000 BID, jardiance 25.  -A1c today and will likely pull back on insulin given his occasional lows -refilled meter supplies, encouraged him to bring meter to next appt -wanted to hold off on CGM  Addendum: He is hypoglycemic on BMP. Reported occasional hypoglycemia during appt, typically on fasting sugars. Will decrease lantus from 30 to 25 units.

## 2022-11-18 NOTE — Patient Instructions (Signed)
Rodney Schultz, it was a pleasure seeing you today! You endorsed feeling well today. Below are some of the things we talked about this visit. We look forward to seeing you in the follow up appointment!  Today we discussed: Diabetes: Keep taking the metformin 1000mg  twice a day, jardiance 25mg , I will check your A1c today and make changes to your insulin if needed.  Blood pressure/heart failure: I refilled your torsemide. Continue your other medicines and follow up with your heart doctor. Let them know you have been taking the torsemide  Continue eliquis. I do not think you need to also be on aspirin  Cholesterol: I will check your levels today I have ordered the following labs today:  Lab Orders         BMP8+Anion Gap         Lipid Profile       Referrals ordered today:   Referral Orders         Amb Referral to Clinical Pharmacist      I have ordered the following medication/changed the following medications:   Stop the following medications: Medications Discontinued During This Encounter  Medication Reason   torsemide (DEMADEX) 20 MG tablet Reorder   metFORMIN (GLUCOPHAGE-XR) 500 MG 24 hr tablet Reorder   glucose blood test strip Reorder     Start the following medications: Meds ordered this encounter  Medications   torsemide (DEMADEX) 20 MG tablet    Sig: Take 1 tablet (20 mg total) by mouth daily as needed for extra fluid.    Dispense:  30 tablet    Refill:  2    Please cancel all previous orders for current medication. Change in dosage or pill size.   metFORMIN (GLUCOPHAGE-XR) 500 MG 24 hr tablet    Sig: Take 2 tablets (1,000 mg total) by mouth 2 (two) times daily.    Dispense:  120 tablet    Refill:  2   glucose blood test strip    Sig: Use three times daily to check blood sugars    Dispense:  100 each    Refill:  12     Follow-up:  1 month    Please make sure to arrive 15 minutes prior to your next appointment. If you arrive late, you may be asked to  reschedule.   We look forward to seeing you next time. Please call our clinic at [phone] if you have any questions or concerns. The best time to call is Monday-Friday from 9am-4pm, but there is someone available 24/7. If after hours or the weekend, call the main hospital number and ask for the Internal Medicine Resident On-Call. If you need medication refills, please notify your pharmacy one week in advance and they will send [place] a request.  Thank you for letting us take part in your care. Wishing you the best!  Thank you, Lyndle Herrlich MD

## 2022-11-18 NOTE — Progress Notes (Addendum)
   CC: routine follow up  HPI:  [name] is a 62 y.o.-year-old male with past medical history as below presenting for routine follow up.  Please see encounters tab for problem-based charting.  Past Medical History:  Diagnosis Date   Diabetes mellitus without complication (HCC)    GERD (gastroesophageal reflux disease)    Hypertension    Review of Systems: As in HPI.  Please see encounters tab for problem based charting.  Physical Exam:  Vitals:   11/18/22 0924  BP: 138/84  Pulse: 67  Temp: 98.3 F (36.8 C)  TempSrc: Oral  SpO2: 100%  Weight: 199 lb 11.2 oz (90.6 kg)  Height: 5\' 10"  (1.778 m)   General:Well-appearing, pleasant, In NAD Cardiac: RRR, no murmurs rubs or gallops. No JVD or BLE pitting edema Respiratory: Normal work of breathing on room air, CTAB Abdominal: Soft, nontender, nondistended   Assessment & Plan:   Essential hypertension, benign On Coreg 6.25 mg BID, Hydralazine 50 mg TID, Imdur 30 mg qd, Entresto 97-103 mg BID. Spironolactone 25 added at last visit. BP 138/84 at this visit, asymptomatic. Of note, dispenses do not match with his reports of what he is taking, for example coreg/imdur dispensed last in October of last year. Will see if we can get pharmacy review before making changes at this time. -continue current regimen -referral to clinical pharmD for review of dispenses and adherence   Heart failure (HCC) In addition to GDMT above also taking torsemide 20mg  about once every 3 days because he has noticed some leg swelling. No orthopnea, PND, chest pain. Euvolemic on exam today. -continue coreg 6.25mg  BID, entresto 97-103 mg BID, jardiance 25, spironolactone 25mg  daily. Torsemide 20mg  PRN for LE edema -Follow up with cardiology -BMP today  Type 2 diabetes mellitus with hyperglycemia (HCC) A1c 15 at last check in early February. He says he checks his sugars every day and that his numbers are typically 90s-110s, though he does sometimes  get lows in the 60s every couple of days and feels weak when this happens. Taking metformin XR 1000 BID, lantus 30, novolog 5 but only takes it once a day with breakfast, jardiance 25mg . Didn't bring meter today. -continue metformin XR 1000 BID, jardiance 25.  -A1c today and will likely pull back on insulin given his occasional lows -refilled meter supplies, encouraged him to bring meter to next appt -wanted to hold off on CGM  CVA (cerebral vascular accident) [facility]) He does not believe he had a stroke. He thinks he was told to take both asa and eliquis, but cardiology notes mention to take just eliquis. No new FND. - advised to continue just eliquis. He wants to check with cardiology about this.  Hyperlipidemia Triglycerides mildly elevated at last check on lipitor 80 and vascepa 2g BID. He was not fasting at that time but is fasting today. -lipid panel today  Healthcare maintenance Some inconsistencies between his adherence history and dispense records, will double check with PharmD review -clinical pharmacist referral   Addendum: Cr. Around avg for past 2-3 months, uncertain exactly what his baseline is and as mentioned in note also uncertain about his dispense record with GDMT. Will monitor and adjust regimen as needed. Mildly elevated triglycerides on lipid panel. Continue statin and vascepa. He is hypoglycemic on BMP. Reported occasional hypoglycemia during appt, typically on fasting sugars. Will decrease lantus from 30 to 25 units.  Patient discussed with Dr.  Sol Blazing

## 2022-11-18 NOTE — Assessment & Plan Note (Addendum)
In addition to GDMT above also taking torsemide 20mg  about once every 3 days because he has noticed some leg swelling. No orthopnea, PND, chest pain. Euvolemic on exam today. -continue coreg 6.25mg  BID, entresto 97-103 mg BID, jardiance 25, spironolactone 25mg  daily. Torsemide 20mg  PRN for LE edema -Follow up with cardiology -BMP today

## 2022-11-19 LAB — LIPID PANEL
Chol/HDL Ratio: 5.1 ratio — ABNORMAL HIGH (ref 0.0–5.0)
Cholesterol, Total: 143 mg/dL (ref 100–199)
HDL: 28 mg/dL — ABNORMAL LOW (ref >39)
LDL Chol Calc (NIH): 84 mg/dL (ref 0–99)
Triglycerides: 180 mg/dL — ABNORMAL HIGH (ref 0–149)
VLDL Cholesterol Cal: 31 mg/dL (ref 5–40)

## 2022-11-19 LAB — BMP8+ANION GAP
Anion Gap: 16 mmol/L (ref 10.0–18.0)
BUN/Creatinine Ratio: 13 (ref 10–24)
BUN: 25 mg/dL (ref 8–27)
CO2: 21 mmol/L (ref 20–29)
Calcium: 9.7 mg/dL (ref 8.6–10.2)
Chloride: 106 mmol/L (ref 96–106)
Creatinine, Ser: 1.91 mg/dL — ABNORMAL HIGH (ref 0.76–1.27)
Glucose: 64 mg/dL — ABNORMAL LOW (ref 70–99)
Potassium: 4.8 mmol/L (ref 3.5–5.2)
Sodium: 143 mmol/L (ref 134–144)
eGFR: 39 mL/min/{1.73_m2} — ABNORMAL LOW (ref >59)

## 2022-11-20 ENCOUNTER — Other Ambulatory Visit (HOSPITAL_BASED_OUTPATIENT_CLINIC_OR_DEPARTMENT_OTHER): Payer: Self-pay

## 2022-11-23 ENCOUNTER — Other Ambulatory Visit (HOSPITAL_BASED_OUTPATIENT_CLINIC_OR_DEPARTMENT_OTHER): Payer: Self-pay

## 2022-11-23 MED ORDER — INSULIN GLARGINE 100 UNIT/ML SOLOSTAR PEN: 25.0000 [IU] | PEN_INJECTOR | Freq: Every day | SUBCUTANEOUS | 8 refills | Status: DC

## 2022-11-23 NOTE — Addendum Note (Signed)
Addended byLyndle Herrlich on: 11/23/2022 10:48 AM   Modules accepted: Orders

## 2022-11-25 NOTE — Progress Notes (Signed)
Internal Medicine Clinic Attending  Case discussed with Dr. Sridharan  At the time of the visit.  We reviewed the resident's history and exam and pertinent patient test results.  I agree with the assessment, diagnosis, and plan of care documented in the resident's note.  

## 2022-12-10 ENCOUNTER — Other Ambulatory Visit (HOSPITAL_COMMUNITY): Payer: Self-pay

## 2022-12-21 ENCOUNTER — Other Ambulatory Visit (HOSPITAL_COMMUNITY): Payer: Self-pay | Admitting: Cardiology

## 2022-12-21 ENCOUNTER — Other Ambulatory Visit (HOSPITAL_BASED_OUTPATIENT_CLINIC_OR_DEPARTMENT_OTHER): Payer: Self-pay

## 2022-12-21 ENCOUNTER — Other Ambulatory Visit: Payer: Self-pay | Admitting: Internal Medicine

## 2022-12-21 DIAGNOSIS — E1165 Type 2 diabetes mellitus with hyperglycemia: Secondary | ICD-10-CM

## 2022-12-21 MED ORDER — EMPAGLIFLOZIN 25 MG PO TABS
25.0000 mg | ORAL_TABLET | Freq: Every day | ORAL | 2 refills | Status: DC
  Filled 2022-12-21: qty 30, 30d supply, fill #0

## 2022-12-21 MED ORDER — HYDRALAZINE HCL 50 MG PO TABS
50.0000 mg | ORAL_TABLET | Freq: Three times a day (TID) | ORAL | 3 refills | Status: DC
  Filled 2022-12-21: qty 90, 30d supply, fill #0
  Filled 2023-08-03: qty 90, 30d supply, fill #1

## 2022-12-22 ENCOUNTER — Other Ambulatory Visit (HOSPITAL_BASED_OUTPATIENT_CLINIC_OR_DEPARTMENT_OTHER): Payer: Self-pay

## 2022-12-22 ENCOUNTER — Other Ambulatory Visit: Payer: Self-pay

## 2022-12-23 ENCOUNTER — Other Ambulatory Visit (HOSPITAL_BASED_OUTPATIENT_CLINIC_OR_DEPARTMENT_OTHER): Payer: Self-pay

## 2023-01-04 ENCOUNTER — Other Ambulatory Visit (HOSPITAL_BASED_OUTPATIENT_CLINIC_OR_DEPARTMENT_OTHER): Payer: Self-pay

## 2023-01-04 ENCOUNTER — Other Ambulatory Visit: Payer: Self-pay | Admitting: Interventional Cardiology

## 2023-01-04 ENCOUNTER — Other Ambulatory Visit: Payer: Self-pay | Admitting: Internal Medicine

## 2023-01-04 DIAGNOSIS — E1165 Type 2 diabetes mellitus with hyperglycemia: Secondary | ICD-10-CM

## 2023-01-05 ENCOUNTER — Other Ambulatory Visit (HOSPITAL_BASED_OUTPATIENT_CLINIC_OR_DEPARTMENT_OTHER): Payer: Self-pay

## 2023-01-06 ENCOUNTER — Other Ambulatory Visit: Payer: Self-pay | Admitting: Internal Medicine

## 2023-01-06 ENCOUNTER — Other Ambulatory Visit (HOSPITAL_BASED_OUTPATIENT_CLINIC_OR_DEPARTMENT_OTHER): Payer: Self-pay

## 2023-01-06 DIAGNOSIS — E1165 Type 2 diabetes mellitus with hyperglycemia: Secondary | ICD-10-CM

## 2023-01-06 MED ORDER — INSULIN GLARGINE 100 UNIT/ML SOLOSTAR PEN: 25.0000 [IU] | PEN_INJECTOR | Freq: Every day | SUBCUTANEOUS | 5 refills | Status: DC

## 2023-01-06 MED ORDER — UNIFINE PENTIPS 32G X 4 MM MISC: 2 refills | Status: DC

## 2023-01-07 ENCOUNTER — Other Ambulatory Visit (HOSPITAL_BASED_OUTPATIENT_CLINIC_OR_DEPARTMENT_OTHER): Payer: Self-pay

## 2023-01-07 ENCOUNTER — Other Ambulatory Visit (HOSPITAL_COMMUNITY): Payer: Self-pay

## 2023-01-08 ENCOUNTER — Other Ambulatory Visit (HOSPITAL_BASED_OUTPATIENT_CLINIC_OR_DEPARTMENT_OTHER): Payer: Self-pay

## 2023-01-13 ENCOUNTER — Other Ambulatory Visit (HOSPITAL_BASED_OUTPATIENT_CLINIC_OR_DEPARTMENT_OTHER): Payer: Self-pay

## 2023-01-13 ENCOUNTER — Ambulatory Visit: Payer: Medicaid Other

## 2023-01-13 VITALS — BP 144/81 | HR 50 | Temp 97.9°F | Wt 194.3 lb

## 2023-01-13 DIAGNOSIS — I5042 Chronic combined systolic (congestive) and diastolic (congestive) heart failure: Secondary | ICD-10-CM

## 2023-01-13 DIAGNOSIS — R569 Unspecified convulsions: Secondary | ICD-10-CM

## 2023-01-13 DIAGNOSIS — E785 Hyperlipidemia, unspecified: Secondary | ICD-10-CM | POA: Diagnosis not present

## 2023-01-13 DIAGNOSIS — E1165 Type 2 diabetes mellitus with hyperglycemia: Secondary | ICD-10-CM

## 2023-01-13 DIAGNOSIS — Z794 Long term (current) use of insulin: Secondary | ICD-10-CM | POA: Diagnosis not present

## 2023-01-13 DIAGNOSIS — I63512 Cerebral infarction due to unspecified occlusion or stenosis of left middle cerebral artery: Secondary | ICD-10-CM | POA: Diagnosis not present

## 2023-01-13 DIAGNOSIS — I110 Hypertensive heart disease with heart failure: Secondary | ICD-10-CM

## 2023-01-13 DIAGNOSIS — I10 Essential (primary) hypertension: Secondary | ICD-10-CM

## 2023-01-13 LAB — POCT GLYCOSYLATED HEMOGLOBIN (HGB A1C): Hemoglobin A1C: 6.6 % — AB (ref 4.0–5.6)

## 2023-01-13 LAB — GLUCOSE, CAPILLARY: Glucose-Capillary: 107 mg/dL — ABNORMAL HIGH (ref 70–99)

## 2023-01-13 MED ORDER — APIXABAN 5 MG PO TABS
5.0000 mg | ORAL_TABLET | Freq: Two times a day (BID) | ORAL | 2 refills | Status: DC
  Filled 2023-01-13: qty 60, 30d supply, fill #0
  Filled 2023-11-10: qty 60, 30d supply, fill #1

## 2023-01-13 MED ORDER — LANCETS MICRO THIN 33G MISC
1 refills | Status: DC
  Filled 2023-01-13: qty 100, 50d supply, fill #0

## 2023-01-13 MED ORDER — ATORVASTATIN CALCIUM 80 MG PO TABS
80.0000 mg | ORAL_TABLET | Freq: Every day | ORAL | 2 refills | Status: DC
  Filled 2023-01-13: qty 30, 30d supply, fill #0
  Filled 2023-03-24: qty 30, 30d supply, fill #1
  Filled 2023-04-19: qty 30, 30d supply, fill #2

## 2023-01-13 MED ORDER — UNIFINE PENTIPS 32G X 4 MM MISC
2 refills | Status: DC
  Filled 2023-01-13: qty 100, fill #0
  Filled 2023-01-13: qty 100, 100d supply, fill #0
  Filled ????-??-??: fill #0

## 2023-01-13 MED ORDER — GLUCOSE BLOOD VI STRP
ORAL_STRIP | 12 refills | Status: DC
  Filled 2023-01-13: qty 100, 33d supply, fill #0
  Filled 2023-08-03 (×2): qty 100, 33d supply, fill #1
  Filled 2023-11-17: qty 100, 33d supply, fill #2

## 2023-01-13 MED ORDER — INSULIN GLARGINE 100 UNIT/ML SOLOSTAR PEN
10.0000 [IU] | PEN_INJECTOR | Freq: Every day | SUBCUTANEOUS | 11 refills | Status: DC
  Filled 2023-01-13: qty 3, 28d supply, fill #0

## 2023-01-13 MED ORDER — ENTRESTO 24-26 MG PO TABS
1.0000 | ORAL_TABLET | Freq: Two times a day (BID) | ORAL | 3 refills | Status: DC
  Filled 2023-01-13: qty 60, 30d supply, fill #0

## 2023-01-13 NOTE — Patient Instructions (Signed)
Thank you, [name] for allowing [place] to provide your care today. Today we discussed :  High blood pressure, heart failure: Continue taking coreg 6.25mg  twice daily and hydralazine 50mg  three times daily. I am starting Entresto 24-26 mg twice daily. Stop spironolactone and IMDUR. We will check your labs at a follow up in 1 week.  T2DM: Your A1c has improved dramatically. Please reduce your lantus to 10 units daily. Measure your blood sugar every morning first thing when you wake up and bring your meter at your follow up. Hold your insulin if you are seeing low blood sugars and drink juice if you are feeling low blood sugar. Continue your metformin and jardiance.  High cholesterol: Please make sure you are taking your atorvastatin every day. We will recheck your cholesterol in a few months.  I have ordered the following labs for you:   Lab Orders         Glucose, capillary         POC Hbg A1C       Referrals ordered today:   Referral Orders  No referral(s) requested today     I have ordered the following medication/changed the following medications:   Stop the following medications: Medications Discontinued During This Encounter  Medication Reason   sacubitril-valsartan (ENTRESTO) 97-103 MG    insulin aspart (NOVOLOG) 100 UNIT/ML FlexPen    isosorbide mononitrate (IMDUR) 30 MG 24 hr tablet    spironolactone (ALDACTONE) 25 MG tablet    Lancets Micro Thin 33G MISC Reorder   apixaban (ELIQUIS) 5 MG TABS tablet Reorder   atorvastatin (LIPITOR) 80 MG tablet Reorder   glucose blood test strip Reorder   insulin glargine (LANTUS) 100 UNIT/ML Solostar Pen Reorder   Insulin Pen Needle (UNIFINE PENTIPS) 32G X 4 MM MISC Reorder     Start the following medications: Meds ordered this encounter  Medications   sacubitril-valsartan (ENTRESTO) 24-26 MG    Sig: Take 1 tablet by mouth 2 (two) times daily.    Dispense:  60 tablet    Refill:  3   insulin glargine (LANTUS) 100 UNIT/ML  Solostar Pen    Sig: Inject 10 Units into the skin daily.    Dispense:  3 mL    Refill:  11   Lancets Micro Thin 33G MISC    Sig: Use to check blood sugar twice daily    Dispense:  100 each    Refill:  1   Insulin Pen Needle (UNIFINE PENTIPS) 32G X 4 MM MISC    Sig: Use daily with insulin    Dispense:  100 each    Refill:  2   glucose blood test strip    Sig: Use three times daily to check blood sugars    Dispense:  100 each    Refill:  12   atorvastatin (LIPITOR) 80 MG tablet    Sig: Take 1 tablet (80 mg total) by mouth daily.    Dispense:  30 tablet    Refill:  2   apixaban (ELIQUIS) 5 MG TABS tablet    Sig: Take 1 tablet (5 mg total) by mouth 2 (two) times daily.    Dispense:  60 tablet    Refill:  2     Follow up:  1 week      We look forward to seeing you next time. Please call our clinic at [phone] if you have any questions or concerns. The best time to call is Monday-Friday from 9am-4pm, but  there is someone available 24/7. If after hours or the weekend, call the main hospital number and ask for the Internal Medicine Resident On-Call. If you need medication refills, please notify your pharmacy one week in advance and they will send [place] a request.   Thank you for trusting me with your care. Wishing you the best!   Willette Cluster, MD [facility]

## 2023-01-13 NOTE — Progress Notes (Deleted)
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 Spironolactone 25 added at last visit. BP 138/84 at this visit, asymptomatic. Of note, dispenses do not match with his reports of what he is taking, for example coreg/imdur dispensed last in October of last year. Will see if we can get pharmacy review before making changes at this time. 144/81  T2DM Ldl 84 11/2022 A1c 15 08/27/2022 Microalbumin 652 Lows in the past: metformin XR 1000BID, lantus 25u, novolog 5 breakfast, jardiance 25 A1c Continuous blood glucose? Foot exam Optho exam ordered  CKD3b Diabetic nephropathy severe microalbuminuria Nephro referral Finerenone instead of spiro?  Seizure Keppra Neuro referral?  HLD Secondary prevention goal <70 Lipitor 80 Vascepa 2g BID Repatha q2 weeks

## 2023-01-17 NOTE — Assessment & Plan Note (Addendum)
A1c from 15% 08/2022 to 6.6% today. Patient has not taken his lantus 25u nightly in around a week or so and has not taken novolog in months. Does report adherence to metformin and jardiance. Patient endorses exercising more in the past few months but no other significant changes. Blood glucose on his home meter ranging from 41-151 with an average of 89. Blood glucose noted to be 48 on a day where patient reports he no longer had insulin. Would not be consistent for jardiance and metformin to cause this. Overall unclear how his A1c so drastically changed but no recent transfusions and no anemia at last cbc to suggest blood loss or hemolysis. May all be related to med adherence in the past.No AM fasting glucose, patient counseled on checking fasting AM glucose. Novolog stopped and lantus decreased to 10 u. Check glucometer in 1 week when he returns, titrate insulin as needed. Foot exam completed. Optho referral placed. Has severe microalbuminuria, is on max dose jardiance. Can titrate entresto as tolerated for this.

## 2023-01-17 NOTE — Assessment & Plan Note (Signed)
LDL 84 11/2022. Patient counseled on taking his atorvastatin 80mg . Can recheck LDL in a few months if he is consistently taking and titrate for goal of LDL<70 for secondary prevention. Patient not taking rapatha or vascepa at this time.

## 2023-01-17 NOTE — Assessment & Plan Note (Signed)
Denies having any seizures. Not taking his keppra at this time.

## 2023-01-17 NOTE — Progress Notes (Signed)
Established Patient Office Visit  Subjective   Patient ID: Rodney Schultz, male    DOB: 03-Nov-1960  Age: 62 y.o. MRN: [id number]  Chief Complaint  Patient presents with   Diabetes    Has been unable to get lantus     Mr. Scavetta is a 62 y/o male with a pmh outlined below. Please see A&P for HPI information.  Diabetes      Review of Systems  All other systems reviewed and are negative.     Objective:     BP (!) 144/81 (BP Location: Right Arm, Patient Position: Sitting, Cuff Size: Normal)   Pulse (!) 50   Temp 97.9 F (36.6 C) (Oral)   Wt 194 lb 4.8 oz (88.1 kg)   SpO2 100%   BMI 27.88 kg/m    Physical Exam Constitutional:      General: He is not in acute distress.    Appearance: Normal appearance. He is normal weight. He is not ill-appearing.  Cardiovascular:     Rate and Rhythm: Normal rate and regular rhythm.     Pulses: Normal pulses.     Heart sounds: Normal heart sounds. No murmur heard.    No gallop.  Pulmonary:     Effort: Pulmonary effort is normal. No respiratory distress.     Breath sounds: Normal breath sounds. No wheezing or rales.  Musculoskeletal:     Right lower leg: No edema.     Left lower leg: No edema.  Skin:    General: Skin is warm and dry.  Neurological:     Mental Status: He is alert.      Results for orders placed or performed in visit on 01/13/23  Glucose, capillary  Result Value Ref Range   Glucose-Capillary 107 (H) 70 - 99 mg/dL  POC Hbg W2N  Result Value Ref Range   Hemoglobin A1C 6.6 (A) 4.0 - 5.6 %   HbA1c POC (<> result, manual entry)     HbA1c, POC (prediabetic range)     HbA1c, POC (controlled diabetic range)        The ASCVD Risk score (Arnett DK, et al., 2019) failed to calculate for the following reasons:   The patient has a prior MI or stroke diagnosis    Assessment & Plan:   Problem List Items Addressed This Visit       Cardiovascular and Mediastinum   Essential hypertension, benign    BP  144/81 and not controlled. Patient reports taking coreg 6.25 BID, hydralazine 50mg  TID. Not currently taking entresto, imdur or spironolactone. Starting [facility] 24-26mg  today for BP and CHF. Need to monitor closely as this patient has had high normal potassium in the past with CKD. Would not restart spiro in the future for this reason. Repeat BMP in 1 week.      Relevant Medications   sacubitril-valsartan (ENTRESTO) 24-26 MG   atorvastatin (LIPITOR) 80 MG tablet   apixaban (ELIQUIS) 5 MG TABS tablet   Heart failure (HCC)   Relevant Medications   sacubitril-valsartan (ENTRESTO) 24-26 MG   atorvastatin (LIPITOR) 80 MG tablet   apixaban (ELIQUIS) 5 MG TABS tablet   CVA (cerebral vascular accident) (HCC)   Relevant Medications   sacubitril-valsartan (ENTRESTO) 24-26 MG   atorvastatin (LIPITOR) 80 MG tablet   apixaban (ELIQUIS) 5 MG TABS tablet     Endocrine   Type 2 diabetes mellitus with hyperglycemia (HCC) - Primary    A1c from 15% 08/2022 to 6.6% today. Patient has not taken  his lantus 25u nightly in around a week or so and has not taken novolog in months. Does report adherence to metformin and jardiance. Patient endorses exercising more in the past few months but no other significant changes. Blood glucose on his home meter ranging from 41-151 with an average of 89. Blood glucose noted to be 48 on a day where patient reports he no longer had insulin. Would not be consistent for jardiance and metformin to cause this. Overall unclear how his A1c so drastically changed but no recent transfusions and no anemia at last cbc to suggest blood loss or hemolysis. May all be related to med adherence in the past.No AM fasting glucose, patient counseled on checking fasting AM glucose. Novolog stopped and lantus decreased to 10 u. Check glucometer in 1 week when he returns, titrate insulin as needed. Foot exam completed. Optho referral placed. Has severe microalbuminuria, is on max dose jardiance. Can titrate  entresto as tolerated for this.      Relevant Medications   insulin glargine (LANTUS) 100 UNIT/ML Solostar Pen   Lancets Micro Thin 33G MISC   Insulin Pen Needle (UNIFINE PENTIPS) 32G X 4 MM MISC   glucose blood test strip   atorvastatin (LIPITOR) 80 MG tablet   Other Relevant Orders   POC Hbg A1C (Completed)   Glucose, capillary (Completed)     Other   Hyperlipidemia    LDL 84 11/2022. Patient counseled on taking his atorvastatin 80mg . Can recheck LDL in a few months if he is consistently taking and titrate for goal of LDL<70 for secondary prevention. Patient not taking rapatha or vascepa at this time.      Relevant Medications   sacubitril-valsartan (ENTRESTO) 24-26 MG   atorvastatin (LIPITOR) 80 MG tablet   apixaban (ELIQUIS) 5 MG TABS tablet   Seizure (HCC)    Denies having any seizures. Not taking his keppra at this time.       No follow-ups on file.    Willette Cluster, MD

## 2023-01-17 NOTE — Assessment & Plan Note (Signed)
BP 144/81 and not controlled. Patient reports taking coreg 6.25 BID, hydralazine 50mg  TID. Not currently taking entresto, imdur or spironolactone. Starting Entresto 24-26mg  today for BP and CHF. Need to monitor closely as this patient has had high normal potassium in the past with CKD. Would not restart spiro in the future for this reason. Repeat BMP in 1 week.

## 2023-01-18 NOTE — Progress Notes (Signed)
Internal Medicine Clinic Attending  Case discussed with Dr. Rogers  At the time of the visit.  We reviewed the resident's history and exam and pertinent patient test results.  I agree with the assessment, diagnosis, and plan of care documented in the resident's note.  

## 2023-01-19 ENCOUNTER — Telehealth (HOSPITAL_COMMUNITY): Payer: Self-pay

## 2023-01-19 ENCOUNTER — Other Ambulatory Visit (HOSPITAL_COMMUNITY): Payer: Self-pay

## 2023-01-19 NOTE — Telephone Encounter (Signed)
Advanced Heart Failure Patient Advocate Encounter  Received notification that [employer] assistance for Sherryll Burger is due for renewal. Review of this patient chart shows that there is current active medicaid coverage.   Test claim returns $4 copay for 90 days, medication assistance does not need to be renewed at this time.  Burnell Blanks, CPhT Rx Patient Advocate Phone: [phone]

## 2023-01-20 ENCOUNTER — Other Ambulatory Visit (HOSPITAL_BASED_OUTPATIENT_CLINIC_OR_DEPARTMENT_OTHER): Payer: Self-pay

## 2023-01-20 ENCOUNTER — Other Ambulatory Visit: Payer: Self-pay

## 2023-01-20 ENCOUNTER — Ambulatory Visit: Payer: Medicaid Other | Admitting: Student

## 2023-01-20 ENCOUNTER — Encounter: Payer: Self-pay | Admitting: Student

## 2023-01-20 VITALS — BP 164/90 | HR 46 | Temp 97.5°F | Ht 70.0 in | Wt 195.6 lb

## 2023-01-20 DIAGNOSIS — I700 Atherosclerosis of aorta: Secondary | ICD-10-CM | POA: Diagnosis not present

## 2023-01-20 DIAGNOSIS — I5022 Chronic systolic (congestive) heart failure: Secondary | ICD-10-CM

## 2023-01-20 DIAGNOSIS — I5042 Chronic combined systolic (congestive) and diastolic (congestive) heart failure: Secondary | ICD-10-CM | POA: Diagnosis present

## 2023-01-20 DIAGNOSIS — N1832 Chronic kidney disease, stage 3b: Secondary | ICD-10-CM | POA: Diagnosis not present

## 2023-01-20 DIAGNOSIS — E1122 Type 2 diabetes mellitus with diabetic chronic kidney disease: Secondary | ICD-10-CM | POA: Diagnosis not present

## 2023-01-20 DIAGNOSIS — F17210 Nicotine dependence, cigarettes, uncomplicated: Secondary | ICD-10-CM | POA: Diagnosis not present

## 2023-01-20 DIAGNOSIS — Z794 Long term (current) use of insulin: Secondary | ICD-10-CM | POA: Diagnosis not present

## 2023-01-20 DIAGNOSIS — E785 Hyperlipidemia, unspecified: Secondary | ICD-10-CM | POA: Diagnosis not present

## 2023-01-20 DIAGNOSIS — Z0000 Encounter for general adult medical examination without abnormal findings: Secondary | ICD-10-CM

## 2023-01-20 DIAGNOSIS — I130 Hypertensive heart and chronic kidney disease with heart failure and stage 1 through stage 4 chronic kidney disease, or unspecified chronic kidney disease: Secondary | ICD-10-CM | POA: Diagnosis not present

## 2023-01-20 DIAGNOSIS — I10 Essential (primary) hypertension: Secondary | ICD-10-CM

## 2023-01-20 DIAGNOSIS — E118 Type 2 diabetes mellitus with unspecified complications: Secondary | ICD-10-CM

## 2023-01-20 DIAGNOSIS — E1165 Type 2 diabetes mellitus with hyperglycemia: Secondary | ICD-10-CM

## 2023-01-20 MED ORDER — LANCETS MICRO THIN 33G MISC
1 refills | Status: DC
  Filled 2023-01-20: qty 100, fill #0

## 2023-01-20 MED ORDER — METFORMIN HCL ER 500 MG PO TB24
500.0000 mg | ORAL_TABLET | Freq: Two times a day (BID) | ORAL | 2 refills | Status: DC
  Filled 2023-01-20: qty 120, 60d supply, fill #0

## 2023-01-20 MED ORDER — ENTRESTO 49-51 MG PO TABS
1.0000 | ORAL_TABLET | Freq: Two times a day (BID) | ORAL | 2 refills | Status: DC
  Filled 2023-01-20: qty 60, 30d supply, fill #0
  Filled 2023-03-24: qty 60, 30d supply, fill #1
  Filled 2023-04-19: qty 60, 30d supply, fill #2

## 2023-01-20 MED ORDER — EMPAGLIFLOZIN 25 MG PO TABS
25.0000 mg | ORAL_TABLET | Freq: Every day | ORAL | 2 refills | Status: DC
  Filled 2023-01-20: qty 30, 30d supply, fill #0
  Filled 2023-03-24: qty 30, 30d supply, fill #1
  Filled 2023-04-19: qty 30, 30d supply, fill #2

## 2023-01-20 MED ORDER — UNIFINE PENTIPS 32G X 4 MM MISC
2 refills | Status: AC
Start: 2023-01-20 — End: ?
  Filled 2023-01-20: qty 100, fill #0
  Filled 2023-02-03: qty 100, 90d supply, fill #0
  Filled 2023-11-10: qty 100, 90d supply, fill #1
  Filled 2024-01-14: qty 100, 90d supply, fill #2

## 2023-01-20 MED ORDER — ENTRESTO 49-51 MG PO TABS
1.0000 | ORAL_TABLET | Freq: Two times a day (BID) | ORAL | 2 refills | Status: DC
  Filled 2023-01-20: qty 60, 30d supply, fill #0

## 2023-01-20 MED ORDER — INSULIN GLARGINE 100 UNIT/ML SOLOSTAR PEN
10.0000 [IU] | PEN_INJECTOR | Freq: Every day | SUBCUTANEOUS | 11 refills | Status: DC
Start: 2023-01-20 — End: 2023-06-01
  Filled 2023-01-20 (×2): qty 3, 30d supply, fill #0
  Filled 2023-02-27: qty 3, 30d supply, fill #1
  Filled 2023-03-24: qty 3, 30d supply, fill #2
  Filled 2023-04-19: qty 3, 30d supply, fill #3
  Filled 2023-05-13: qty 3, 30d supply, fill #4

## 2023-01-20 MED ORDER — UNIFINE PENTIPS 32G X 4 MM MISC
2 refills | Status: DC
  Filled 2023-01-20: qty 100, fill #0

## 2023-01-20 MED ORDER — EMPAGLIFLOZIN 25 MG PO TABS
25.0000 mg | ORAL_TABLET | Freq: Every day | ORAL | 2 refills | Status: DC
  Filled 2023-01-20: qty 30, 30d supply, fill #0

## 2023-01-20 MED ORDER — METFORMIN HCL ER 500 MG PO TB24
500.0000 mg | ORAL_TABLET | Freq: Two times a day (BID) | ORAL | 2 refills | Status: DC
  Filled 2023-01-20: qty 60, 30d supply, fill #0
  Filled 2023-09-09: qty 60, 30d supply, fill #1
  Filled 2023-12-16: qty 60, 30d supply, fill #2

## 2023-01-20 MED ORDER — LANCETS MICRO THIN 33G MISC
1 refills | Status: AC
Start: 2023-01-20 — End: ?
  Filled 2023-01-20: qty 100, fill #0

## 2023-01-20 MED ORDER — INSULIN GLARGINE 100 UNIT/ML SOLOSTAR PEN
10.0000 [IU] | PEN_INJECTOR | Freq: Every day | SUBCUTANEOUS | 11 refills | Status: DC
  Filled 2023-01-20: qty 3, 30d supply, fill #0

## 2023-01-20 NOTE — Progress Notes (Signed)
Subjective:  CC: BP, DM2 Follow up Visit  HPI:  [name] is a 62 y.o. male with a past medical history stated below and presents today for routine follow-up. Please see problem based assessment and plan for additional details.  Past Medical History:  Diagnosis Date   Diabetes mellitus without complication (HCC)    GERD (gastroesophageal reflux disease)    Hypertension     Current Outpatient Medications on File Prior to Visit  Medication Sig Dispense Refill   apixaban (ELIQUIS) 5 MG TABS tablet Take 1 tablet (5 mg total) by mouth 2 (two) times daily. 60 tablet 2   atorvastatin (LIPITOR) 80 MG tablet Take 1 tablet (80 mg total) by mouth daily. 30 tablet 2   Blood Glucose Monitoring Suppl (ACCU-CHEK GUIDE) w/Device KIT Use as directed twice daily. 1 kit 0   Blood Glucose Monitoring Suppl (ACCU-CHEK GUIDE) w/Device KIT USE AS DIRECTED 1 kit 0   carvedilol (COREG) 6.25 MG tablet Take 1 tablet (6.25 mg total) by mouth 2 (two) times daily. 180 tablet 3   glucose blood test strip Use three times daily to check blood sugars 100 each 12   hydrALAZINE (APRESOLINE) 50 MG tablet Take 1 tablet (50 mg total) by mouth 3 (three) times daily. 90 tablet 3   icosapent Ethyl (VASCEPA) 1 g capsule Take 2 capsules (2 g total) by mouth 2 (two) times daily. 120 capsule 6   levETIRAcetam (KEPPRA) 250 MG tablet Take 1 tablet (250 mg total) by mouth 2 (two) times daily. 60 tablet 2   torsemide (DEMADEX) 20 MG tablet Take 1 tablet (20 mg total) by mouth daily as needed for extra fluid. 30 tablet 2   [DISCONTINUED] loratadine (CLARITIN) 10 MG tablet Take 10 mg by mouth daily as needed for allergies.     No current facility-administered medications on file prior to visit.    Family History  Problem Relation Age of Onset   Healthy Mother     Social History   Socioeconomic History   Marital status: Divorced    Spouse name: Not on file   Number of children: 4   Years of education: Not on file    Highest education level: 11th grade  Occupational History   Occupation: Orthoptist yard    Comment: operates forklift, [employer] representative "yard dog"  Tobacco Use   Smoking status: Some Days    Packs/day: 0.10    Years: 35.00    Additional pack years: 0.00    Total pack years: 3.50    Types: Cigarettes    Start date: 09/02/2021   Smokeless tobacco: Never   Tobacco comments:    "Once in a blue moon"      Vaping Use   Vaping Use: Never used  Substance and Sexual Activity   Alcohol use: Not Currently   Drug use: Never   Sexual activity: Not on file  Other Topics Concern   Not on file  Social History Narrative   Not on file   Social Determinants of Health   Financial Resource Strain: Low Risk  (01/20/2023)   Overall Financial Resource Strain (CARDIA)    Difficulty of Paying Living Expenses: Not hard at all  Food Insecurity: No Food Insecurity (01/20/2023)   Hunger Vital Sign    Worried About Running Out of Food in the Last Year: Never true    Ran Out of Food in the Last Year: Never true  Transportation Needs: No Transportation Needs (01/20/2023)   PRAPARE -  Administrator, Civil Service (Medical): No    Lack of Transportation (Non-Medical): No  Physical Activity: Sufficiently Active (01/20/2023)   Exercise Vital Sign    Days of Exercise per Week: 7 days    Minutes of Exercise per Session: 90 min  Stress: No Stress Concern Present (01/20/2023)   [employer] of Occupational Health - Occupational Stress Questionnaire    Feeling of Stress : Not at all  Social Connections: Moderately Isolated (01/20/2023)   Social Connection and Isolation Panel [NHANES]    Frequency of Communication with Friends and Family: Once a week    Frequency of Social Gatherings with Friends and Family: Twice a week    Attends Religious Services: More than 4 times per year    Active Member of [employer] or Organizations: No    Attends Banker Meetings: Never    Marital Status: Widowed   Intimate Partner Violence: Not At Risk (01/20/2023)   Humiliation, Afraid, Rape, and Kick questionnaire    Fear of Current or Ex-Partner: No    Emotionally Abused: No    Physically Abused: No    Sexually Abused: No    Review of Systems: ROS negative except for what is noted on the assessment and plan.  Objective:   Vitals:   01/20/23 0845 01/20/23 0853 01/20/23 0913  BP: (!) 153/88 (!) 156/83 (!) 164/90  Pulse: (!) 56 (!) 49 (!) 46  Temp: (!) 97.5 F (36.4 C)    TempSrc: Oral    SpO2: 100%    Weight: 195 lb 9.6 oz (88.7 kg)    Height: 5\' 10"  (1.778 m)      Physical Exam: Constitutional: well-appearing male in no acute distress HENT: normocephalic atraumatic, mucous membranes moist Eyes: conjunctiva non-erythematous Neck: supple Cardiovascular: regular rate and rhythm, no m/r/g Pulmonary/Chest: normal work of breathing on room air, lungs clear to auscultation bilaterally Abdominal: soft, non-tender, non-distended MSK: normal bulk and tone   Assessment & Plan:  Chronic HFrEF (heart failure with reduced ejection fraction) (HCC) In addition to GDMT above also taking torsemide 20mg  about once a week -continue coreg 6.25mg  BID, entresto 49-51 mg BID, Torsemide 20mg  PRN for LE edema, Hydralazine TID -BMP today -Refill Jardiance 25,  Essential hypertension, benign BP 156/83 today, recheck blood pressure 164/90.  Not taking Jaurdiance currently (needs refill), Taking all his other medications. Plan: -Increase Ernesto to 49/51 BID   Type 2 diabetes with complication [facility]) Patient has been out of insulin and Jardiance recently, though blood sugars have remained relatively stable.  Plan: -Refill insulin Lantus 10 units daily -Refill and resume Jardiance -Decrease metformin 1000 mg twice daily to 500 mg twice daily in setting of CKD.  Hyperlipidemia LDL goal less than 70, last LDL of May 2024 elevated at 84.  Patient has been taking statin Plan: -Lipid panel today -If  LDL continues to be elevated, can consider increasing statin dosage.   Aortic atherosclerosis (HCC) LDL goal less than 70, last LDL of May 2024 elevated at 84.  Patient has been taking statin Plan: -Lipid panel today -If LDL continues to be elevated, can consider increasing statin dosage.  Chronic kidney disease, stage 3b (HCC) Creatinine hovering around 1.6-1.8 range, estimated GFR around 30.  Plan: Continue Sherryll Burger and Jardiance BMP today to assess kidney function   Patient seen with Dr. Cleda Daub  [name] MD [facility] Internal Medicine  PGY-1 Pager: [phone]  Phone: [phone] Date 01/20/2023  Time 10:02 AM

## 2023-01-20 NOTE — Assessment & Plan Note (Addendum)
BP 156/83 today, recheck blood pressure 164/90.  Not taking Jaurdiance currently (needs refill), Taking all his other medications. Plan: -Increase Ernesto to 49/51 BID -Resume Jardiance

## 2023-01-20 NOTE — Assessment & Plan Note (Addendum)
LDL goal less than 70, last LDL of May 2024 elevated at 84.  Patient has been taking statin Plan: -Lipid panel today -If LDL continues to be elevated, can consider increasing statin dosage. 

## 2023-01-20 NOTE — Assessment & Plan Note (Addendum)
Patient has been out of insulin and Jardiance recently, though blood sugars have remained relatively stable.  Plan: -Refill insulin Lantus 10 units daily -Refill and resume Jardiance -Decrease metformin 1000 mg twice daily to 500 mg twice daily in setting of CKD.

## 2023-01-20 NOTE — Progress Notes (Signed)
Internal Medicine Clinic Attending  I saw and evaluated the patient.  I personally confirmed the key portions of the history and exam documented by the resident  and I reviewed pertinent patient test results.  The assessment, diagnosis, and plan were formulated together and I agree with the documentation in the resident's note. As noted with CKD 3b will decrease metformin.  Want to get BP down to <130/80, needs uptitration of entresto.

## 2023-01-20 NOTE — Patient Instructions (Signed)
Thank you, Rodney Schultz for allowing [place] to provide your care today. Today we discussed blood pressure and Diabetes mangment.    I have ordered the following labs for you:  Lab Orders         BMP8+Anion Gap         Lipid Profile        Referrals ordered today:   Referral Orders  No referral(s) requested today     I have ordered the following medication/changed the following medications:   Stop the following medications: Medications Discontinued During This Encounter  Medication Reason   sacubitril-valsartan (ENTRESTO) 24-26 MG    metFORMIN (GLUCOPHAGE-XR) 500 MG 24 hr tablet Reorder   empagliflozin (JARDIANCE) 25 MG TABS tablet Reorder   insulin glargine (LANTUS) 100 UNIT/ML Solostar Pen Reorder   triamcinolone cream (KENALOG) 0.1 %    Lancets Micro Thin 33G MISC Reorder   Insulin Pen Needle (UNIFINE PENTIPS) 32G X 4 MM MISC Reorder     Start the following medications: Meds ordered this encounter  Medications   empagliflozin (JARDIANCE) 25 MG TABS tablet    Sig: Take 1 tablet (25 mg total) by mouth daily before breakfast.    Dispense:  30 tablet    Refill:  2    HF FUND   insulin glargine (LANTUS) 100 UNIT/ML Solostar Pen    Sig: Inject 10 Units into the skin daily. Discard pen after 28 days.    Dispense:  3 mL    Refill:  11   sacubitril-valsartan (ENTRESTO) 49-51 MG    Sig: Take 1 tablet by mouth 2 (two) times daily.    Dispense:  60 tablet    Refill:  2   metFORMIN (GLUCOPHAGE-XR) 500 MG 24 hr tablet    Sig: Take 1 tablet (500 mg total) by mouth 2 (two) times daily.    Dispense:  120 tablet    Refill:  2   Lancets Micro Thin 33G MISC    Sig: Use to check blood sugar twice daily    Dispense:  100 each    Refill:  1   Insulin Pen Needle (UNIFINE PENTIPS) 32G X 4 MM MISC    Sig: Use daily with insulin    Dispense:  100 each    Refill:  2     Follow up:  1 month    We look forward to seeing you next time. Please call our clinic at [phone] if you  have any questions or concerns. The best time to call is Monday-Friday from 9am-4pm, but there is someone available 24/7. If after hours or the weekend, call the main hospital number and ask for the Internal Medicine Resident On-Call. If you need medication refills, please notify your pharmacy one week in advance and they will send [place] a request.   Thank you for trusting me with your care. Wishing you the best!  [name] MD [facility]

## 2023-01-20 NOTE — Assessment & Plan Note (Addendum)
In addition to GDMT above also taking torsemide 20mg  about once a week -continue coreg 6.25mg  BID, entresto 49-51 mg BID, Torsemide 20mg  PRN for LE edema, Hydralazine TID -BMP today -Refill Jardiance 25,

## 2023-01-20 NOTE — Assessment & Plan Note (Signed)
Creatinine hovering around 1.6-1.8 range, estimated GFR around 30.  Plan: Continue Sherryll Burger, and Jardiance BMP today to assess kidney function

## 2023-01-20 NOTE — Assessment & Plan Note (Signed)
LDL goal less than 70, last LDL of May 2024 elevated at 84.  Patient has been taking statin Plan: -Lipid panel today -If LDL continues to be elevated, can consider increasing statin dosage.

## 2023-01-22 ENCOUNTER — Other Ambulatory Visit (HOSPITAL_BASED_OUTPATIENT_CLINIC_OR_DEPARTMENT_OTHER): Payer: Self-pay

## 2023-01-23 LAB — BMP8+ANION GAP
Anion Gap: 16 mmol/L (ref 10.0–18.0)
BUN/Creatinine Ratio: 12 (ref 10–24)
BUN: 23 mg/dL (ref 8–27)
CO2: 21 mmol/L (ref 20–29)
Calcium: 9.3 mg/dL (ref 8.6–10.2)
Chloride: 110 mmol/L — ABNORMAL HIGH (ref 96–106)
Creatinine, Ser: 1.92 mg/dL — ABNORMAL HIGH (ref 0.76–1.27)
Glucose: 111 mg/dL — ABNORMAL HIGH (ref 70–99)
Potassium: 4.5 mmol/L (ref 3.5–5.2)
Sodium: 147 mmol/L — ABNORMAL HIGH (ref 134–144)
eGFR: 38 mL/min/{1.73_m2} — ABNORMAL LOW (ref >59)

## 2023-01-23 LAB — LIPID PANEL
Chol/HDL Ratio: 5.1 ratio — ABNORMAL HIGH (ref 0.0–5.0)
Cholesterol, Total: 123 mg/dL (ref 100–199)
HDL: 24 mg/dL — ABNORMAL LOW (ref >39)
LDL Chol Calc (NIH): 65 mg/dL (ref 0–99)
Triglycerides: 204 mg/dL — ABNORMAL HIGH (ref 0–149)
VLDL Cholesterol Cal: 34 mg/dL (ref 5–40)

## 2023-01-24 ENCOUNTER — Other Ambulatory Visit (HOSPITAL_BASED_OUTPATIENT_CLINIC_OR_DEPARTMENT_OTHER): Payer: Self-pay

## 2023-01-25 ENCOUNTER — Other Ambulatory Visit: Payer: Self-pay

## 2023-02-03 ENCOUNTER — Other Ambulatory Visit (HOSPITAL_BASED_OUTPATIENT_CLINIC_OR_DEPARTMENT_OTHER): Payer: Self-pay

## 2023-02-08 ENCOUNTER — Telehealth (HOSPITAL_COMMUNITY): Payer: Self-pay

## 2023-02-08 NOTE — Telephone Encounter (Signed)
Paperwork faxed to [employer] on 02/07/26 at [phone].Left message for patient on answering machine

## 2023-02-11 ENCOUNTER — Ambulatory Visit (HOSPITAL_COMMUNITY)
Admission: RE | Admit: 2023-02-11 | Discharge: 2023-02-11 | Disposition: A | Discharge status: Home / Self Care | Payer: Medicaid Other | Source: Ambulatory Visit | Attending: Cardiology | Admitting: Cardiology

## 2023-02-11 ENCOUNTER — Other Ambulatory Visit (HOSPITAL_BASED_OUTPATIENT_CLINIC_OR_DEPARTMENT_OTHER): Payer: Self-pay

## 2023-02-11 ENCOUNTER — Encounter (HOSPITAL_COMMUNITY): Payer: Self-pay | Admitting: Cardiology

## 2023-02-11 VITALS — BP 118/78 | HR 69 | Wt 191.8 lb

## 2023-02-11 DIAGNOSIS — N183 Chronic kidney disease, stage 3 unspecified: Secondary | ICD-10-CM | POA: Insufficient documentation

## 2023-02-11 DIAGNOSIS — I252 Old myocardial infarction: Secondary | ICD-10-CM | POA: Insufficient documentation

## 2023-02-11 DIAGNOSIS — I2510 Atherosclerotic heart disease of native coronary artery without angina pectoris: Secondary | ICD-10-CM | POA: Insufficient documentation

## 2023-02-11 DIAGNOSIS — Z79899 Other long term (current) drug therapy: Secondary | ICD-10-CM | POA: Diagnosis not present

## 2023-02-11 DIAGNOSIS — E1122 Type 2 diabetes mellitus with diabetic chronic kidney disease: Secondary | ICD-10-CM | POA: Insufficient documentation

## 2023-02-11 DIAGNOSIS — I130 Hypertensive heart and chronic kidney disease with heart failure and stage 1 through stage 4 chronic kidney disease, or unspecified chronic kidney disease: Secondary | ICD-10-CM | POA: Insufficient documentation

## 2023-02-11 DIAGNOSIS — I701 Atherosclerosis of renal artery: Secondary | ICD-10-CM | POA: Diagnosis not present

## 2023-02-11 DIAGNOSIS — I480 Paroxysmal atrial fibrillation: Secondary | ICD-10-CM | POA: Diagnosis not present

## 2023-02-11 DIAGNOSIS — Z7984 Long term (current) use of oral hypoglycemic drugs: Secondary | ICD-10-CM | POA: Insufficient documentation

## 2023-02-11 DIAGNOSIS — I5022 Chronic systolic (congestive) heart failure: Secondary | ICD-10-CM | POA: Diagnosis not present

## 2023-02-11 DIAGNOSIS — Z8673 Personal history of transient ischemic attack (TIA), and cerebral infarction without residual deficits: Secondary | ICD-10-CM | POA: Insufficient documentation

## 2023-02-11 DIAGNOSIS — E785 Hyperlipidemia, unspecified: Secondary | ICD-10-CM | POA: Insufficient documentation

## 2023-02-11 DIAGNOSIS — Z7901 Long term (current) use of anticoagulants: Secondary | ICD-10-CM | POA: Insufficient documentation

## 2023-02-11 MED ORDER — ISOSORBIDE MONONITRATE ER 30 MG PO TB24
30.0000 mg | ORAL_TABLET | Freq: Every day | ORAL | 3 refills | Status: DC
  Filled 2023-02-11: qty 90, 90d supply, fill #0
  Filled 2023-05-13: qty 90, 90d supply, fill #1

## 2023-02-11 MED ORDER — SPIRONOLACTONE 25 MG PO TABS
25.0000 mg | ORAL_TABLET | Freq: Every day | ORAL | 3 refills | Status: DC
  Filled 2023-02-11: qty 90, 90d supply, fill #0
  Filled 2023-05-13: qty 90, 90d supply, fill #1

## 2023-02-11 MED ORDER — EZETIMIBE 10 MG PO TABS
10.0000 mg | ORAL_TABLET | Freq: Every day | ORAL | 3 refills | Status: DC
Start: 2023-02-11 — End: 2024-04-10
  Filled 2023-02-11: qty 90, 90d supply, fill #0
  Filled 2023-05-13 – 2023-05-14 (×2): qty 90, 90d supply, fill #1

## 2023-02-11 NOTE — Progress Notes (Signed)
ADVANCED HF CLINIC NOTE   Primary Care: Gwenevere Abbot, MD HF Cardiologist: Dr. Shirlee Latch  HPI: Rodney Schultz is a 62 y.o. AAM w/ h/o HTN, T2DM and HLD, admitted to [facility] w/ new systolic heart failure in 11/22.    He reports long history of poorly controlled HTN in the past. Baseline SBPs would average in the 170s. Denies ETOH use. Former smoker but has quit. No known family h/o CHF. No h/o ischemic-like CP. No known h/o COVID but reports having the flu prior to his 11/22 admission.     Admitted 11/22 w/ new CHF, hypertensive + AKI w/ SCr in the 2.0 range (previous baseline ~1.5). Echo w/ severe biventricular dysfunction, LVEF 20-25% with diffuse hypokinesis, mod LVH, GIIIDD (restrictive), RV severely reduced, moderate MR. R/LHC was deferred initially due to renal insufficiency. He was diuresed w/ IV Lasix. GDMT w/ hydralazine, Imdur and Coreg initiated. Discharge wt 177 lb. Referred to TOC.    Seen 1/23 in HF clinic to establish care for his CHF. R/LHC showed mildly elevated PCWP, pulmonary venous hypertension, preserved CO and diffuse CAD w/ occluded large ramus and moderate D1. 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 No good PCI options and he does not have good targets for CABG (would be high risk CABG with low EF).  Medically managed in setting of no anginal chest pain.   Echo in 4/23 showed EF 25-30% with moderate LVH and normal RV, mild MR. Echo 8/23 showed EF 35% with diffuse hypokinesis and akinesis of the mid inferolateral and mid anterolateral walls, normal RV.   Cardiac MRI in 11/23 showed LV EF 42% with RV EF 52%, there was >50% wall thickness subendocardial LGE in the mid-apical inferolateral and anterolateral walls suggestive of prior MI.   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  Carotid dopplers in 2/24 showed only mild stenosis.  He also had AKI on CKD stage  3.  Supposedly he had atrial fibrillation by ECG but the ECGs in the system all show NSR (though one is labeled atrial fibrillation).  He was started on apixaban.  Imdur, spironolactone, and Coreg were stopped.  Echo in 2/24 showed EF up to 55-60% with mild LVH and normal RV.   Unfortunately, despite a nebulous diagnosis of atrial fibrillation and multiple cardiac med adjustments, cardiology was not consulted during this admission.    Today he returns for HF follow up. Weight down 6 lbs. We are not sure if he is taking Imdur or spironolactone.  They are not on his list but he thinks he has them at home. He is doing well symptomatically, no significant exertional dyspnea.  Has been able to do all his activities without problems. He did have an episode of mild chest tightness last week while mowing his grass that resolved with rest.  This is the only time he has had chest pain in recent months. No BRBPR/melena.  He takes torsemide once every 2-3 weeks.   ECG (personally reviewed): NSR, inferolateral ST-T changes  Labs (1/23): K 4.3, creatinine 1.95 Labs (2/23): K 4.4, creatinine 1.75 Labs (4/23): K 4.4, creatinine 2.27, BNP 45, LDL 123, TGs 409 Labs (6/23): K 5.0,  creatinine 1.7 Labs (9/23): K 5.0, creatinine 2.19 Labs (2/24): K 3.6, creatinine 1.88, TGs 514 Labs (7/24): K 4.5, creatinine 1.92, LDL 65, TGs 209  PMH: 1. Type 2 diabetes 2. HTN: Poorly controlled.  -  Renal artery dopplers: Mild renal artery stenosis.   3. GERD 4. CKD stage 3 5. Hyperlipidemia 6. Former smoker 7. CAD: LHC (1/23) with occluded large ramu, occluded moderate D2, 90% dLAD, 80% dLCx, diffuse RCA disease reaching 80% in the mid vessel and 80% in the distal vessel.  No good targets for CABG, and reviewed by interventional cardiology who thought no good interventional targets given absence of anginal pain.  8. 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  - Echo (11/22): EF 20-25%, severe  LV dysfunction with global HK, moderate LVH, grade III DD (restrictive), RV severely reduced, moderate MR  - RHC (1/23): mean RA 8, PA 54/17 mean 32, mean PCWP 18, CI 2.54, PVR 2.8 WU (pulmonary venous hypertension).  - Echo (4/23): EF 25-30% with moderate LVH and normal RV, mild MR. - Echo (8/23): EF 35% with diffuse hypokinesis and akinesis of the mid inferolateral and mid anterolateral walls, normal RV.  - Cardiac MRI (11/23): LV EF 42% with RV EF 52%, there was >50% wall thickness subendocardial LGE in the mid-apical inferolateral and anterolateral walls suggestive of prior MI.  - Echo (2/24): EF 55-60%, mild LVH, normal RV.  9. OSA: Mild on 2/23 sleep study.  10. Left renal mass: Likely cyst by renal US.  11. CVA: 1/24, ?related to atrial fibrillation. 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 - Carotid dopplers (2/24) with mild disease.  12. Atrial fibrillation: Paroxysmal.  Somewhat questionable diagnosis at time of CVA in 1/24.   Current Outpatient Medications  Medication Sig Dispense Refill   apixaban (ELIQUIS) 5 MG TABS tablet Take 1 tablet (5 mg total) by mouth 2 (two) times daily. 60 tablet 2   atorvastatin (LIPITOR) 80 MG tablet Take 1 tablet (80 mg total) by mouth daily. 30 tablet 2   Blood Glucose Monitoring Suppl (ACCU-CHEK GUIDE) w/Device KIT Use as directed twice daily. 1 kit 0   carvedilol (COREG) 6.25 MG tablet Take 1 tablet (6.25 mg total) by mouth 2 (two) times daily. 180 tablet 3   empagliflozin (JARDIANCE) 25 MG TABS tablet Take 1 tablet (25 mg total) by mouth daily before breakfast. 30 tablet 2   ezetimibe (ZETIA) 10 MG tablet Take 1 tablet (10 mg total) by mouth daily. 90 tablet 3   glucose blood test strip Use three times daily to check blood sugars 100 each 12   hydrALAZINE (APRESOLINE) 50 MG tablet Take 1 tablet (50 mg total) by mouth 3 (three) times daily. 90 tablet 3   icosapent Ethyl (VASCEPA) 1 g capsule Take 2 capsules (2 g total) by mouth 2 (two) times  daily. 120 capsule 6   insulin glargine (LANTUS) 100 UNIT/ML Solostar Pen Inject 10 Units into the skin daily. Discard pen after 28 days. 3 mL 11   Insulin Pen Needle (UNIFINE PENTIPS) 32G X 4 MM MISC Use daily with insulin 100 each 2   isosorbide mononitrate (IMDUR) 30 MG 24 hr tablet Take 1 tablet (30 mg total) by mouth daily. 90 tablet 3   Lancets Micro Thin 33G MISC Use to check blood sugar twice daily 100 each 1   levETIRAcetam (KEPPRA) 250 MG tablet Take 1 tablet (250 mg total) by mouth 2 (two) times daily. 60 tablet 2   metFORMIN (GLUCOPHAGE-XR) 500 MG 24 hr tablet Take 1 tablet (500 mg total) by mouth 2 (two) times daily. 60 tablet 2   sacubitril-valsartan (ENTRESTO) 49-51 MG Take 1 tablet by mouth 2 (two) times daily. 60 tablet 2  spironolactone (ALDACTONE) 25 MG tablet Take 1 tablet (25 mg total) by mouth daily. 90 tablet 3   torsemide (DEMADEX) 20 MG tablet Take 1 tablet (20 mg total) by mouth daily as needed for extra fluid. 30 tablet 2   No current facility-administered medications for this encounter.   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, [employer] representative "yard dog"  Tobacco Use   Smoking status: Some Days    Current packs/day: 0.10    Average packs/day: 0.1 packs/day for 35.0 years (3.5 ttl pk-yrs)    Types: Cigarettes    Start date: 09/02/2021   Smokeless tobacco: Never   Tobacco comments:    "Once in a blue moon"      Vaping Use   Vaping status: Never Used  Substance and Sexual Activity   Alcohol use: Not Currently   Drug use: Never   Sexual activity: Not on file  Other Topics Concern   Not on file  Social History Narrative   Not on file    Social Determinants of Health   Financial Resource Strain: Low Risk  (01/20/2023)   Overall Financial Resource Strain (CARDIA)    Difficulty of Paying Living Expenses: Not hard at all  Food Insecurity: No Food Insecurity (01/20/2023)   Hunger Vital Sign    Worried About Running Out of Food in the Last Year: Never true    Ran Out of Food in the Last Year: Never true  Transportation Needs: No Transportation Needs (01/20/2023)   PRAPARE - Administrator, Civil Service (Medical): No    Lack of Transportation (Non-Medical): No  Physical Activity: Sufficiently Active (01/20/2023)   Exercise Vital Sign    Days of Exercise per Week: 7 days    Minutes of Exercise per Session: 90 min  Stress: No Stress Concern Present (01/20/2023)   [employer] of Occupational Health - Occupational Stress Questionnaire    Feeling of Stress : Not at all  Social Connections: Moderately Isolated (01/20/2023)   Social Connection and Isolation Panel [NHANES]    Frequency of Communication with Friends and Family: Once a week    Frequency of Social Gatherings with Friends and Family: Twice a week    Attends Religious Services: More than 4 times per year    Active Member of [employer] or Organizations: No    Attends Banker Meetings: Never    Marital Status: Widowed  Intimate Partner Violence: Not At Risk (01/20/2023)   Humiliation, Afraid, Rape, and Kick questionnaire    Fear of Current or Ex-Partner: No    Emotionally Abused: No    Physically Abused: No    Sexually Abused: No   Family History  Problem Relation Age of Onset   Healthy Mother    BP 118/78   Pulse 69   Wt 87 kg (191 lb 12.8 oz)   SpO2 99%   BMI 27.52 kg/m   Wt Readings from Last 3 Encounters:  02/11/23 87 kg (191 lb 12.8 oz)  01/20/23 88.7 kg (195 lb 9.6 oz)  01/13/23 88.1 kg (194 lb 4.8 oz)   PHYSICAL EXAM:  General: NAD Neck: No JVD, no thyromegaly or thyroid nodule.  Lungs: Clear to auscultation bilaterally with  normal respiratory effort. CV: Nondisplaced PMI.  Heart regular S1/S2, no S3/S4, no murmur.  No peripheral edema.  No carotid bruit.  Normal pedal pulses.  Abdomen: Soft, nontender, no hepatosplenomegaly, no distention.  Skin: Intact without lesions or rashes.  Neurologic: Alert and oriented x 3.  Psych: Normal affect. Extremities: No clubbing or cyanosis.  HEENT: Normal.   ASSESSMENT & PLAN: 1. HTN: Long history.  Renal artery dopplers in 5/23 with mild renal artery stenosis. BP now seems controlled.  2. 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 Echo (11/22) w/ severe biventricular dysfunction, LVEF 20-25%, Mod LVH, GIIIDD (restrictive), RV severely reduced, diffuse HK. Underwent R/LHC (1/23) showing significant CAD but no good targets for CABG.  Echo in 4/23 showed EF persistently low at 25-30%. Echo 8/23 showed EF 35% with diffuse hypokinesis and akinesis of the mid inferolateral and mid anterolateral walls, normal RV.  Cardiac MRI in 11/23 showed LV EF 42% with RV EF 52%, there was >50% wall thickness subendocardial LGE in the mid-apical inferolateral and anterolateral walls suggestive of prior MI.  Interestingly, echo in 2/24 during admission for CVA showed LV EF up to 55-60% with normal RV.  NYHA class I-II, not volume overloaded on exam.  - Continue Jardiance 25 mg daily.  - Continue Coreg 6.25 mg bid.  - Continue Entresto 97/103 bid.  - He is going to make sure he is taking spironolactone 25 mg daily.  If not, he will restart.  - He does not appear to need a diuretic.   - Continue hydralazine 50 mg tid (cannot tolerate higher dose) and he will make sure that he is taking Imdur 30 mg daily.  - Repeat echo to make sure EF remains in normal range.   3. CAD: LHC (1/23) with diffuse disease occluded large ramus and moderate D1. 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 No good PCI options in setting of diffuse disease and no chest  pain, and he does not have good targets for CABG (would be high risk CABG with low EF).  He had 1 episodes of transient chest pain recently.  Continue medical management.  - No ASA given Eliquis use.  - Continue atorvastatin.  4. CKD, Stage III: BMET today.  5. Hyperlipidemia: He is on atorvastatin.   - Add Zetia 10 mg daily to get LDL < 55.  Check lipids in 2 months.  6. Atrial fibrillation: CVA in 2/24 and apparently atrial fibrillation was seen in the hospital.  All ECGs in system show NSR, one is labeled AF but is NSR.  I am not totally sure of this diagnosis, but given recent CVA, will keep him on Eliquis.  - Continue Eliquis. No bleeding issues. Recent CBC stable. 7. CVA: In 2/24, no residual neurological symptoms/signs.  ?AF related.  Carotid dopplers in 2/24 showed no significant stenosis.  - He will continue Eliquis.   Follow up in 4 months with APP.   Marca Ancona  02/11/2023

## 2023-02-11 NOTE — Patient Instructions (Signed)
Medication Changes:  START: SPIRONOLACTONE 25MG  ONCE DAILY   START: IMDUR (ISOSORBIDE) 30MG  ONCE DAILY   START: ZETIA 10MG  ONCE DAILY   Lab Work:  RETURN FOR BLOOD WORK IN 10 DAYS AS SCHEDULED   THEN   RETURN FOR BLOOD WORK IN 2 MONTHS AS SCHEDULED   Testing/Procedures:  Your physician has requested that you have an echocardiogram. Echocardiography is a painless test that uses sound waves to create images of your heart. It provides your doctor with information about the size and shape of your heart and how well your heart's chambers and valves are working. You may receive an ultrasound enhancing agent through an IV if needed to better visualize your heart during the echo.This procedure takes approximately one hour. There are no restrictions for this procedure.   Follow-Up in: 4 MONTHS AS SCHEDULED WITH APP   At the [facility], you and your health needs are our priority. We have a designated team specialized in the treatment of Heart Failure. This Care Team includes your primary Heart Failure Specialized Cardiologist (physician), Advanced Practice Providers (APPs- Physician Assistants and Nurse Practitioners), and Pharmacist who all work together to provide you with the care you need, when you need it.   You may see any of the following providers on your designated Care Team at your next follow up:  Dr. Arvilla Meres Dr. Marca Ancona Dr. Marcos Eke, NP Robbie Lis, [place] [facility] [place], [place] Brynda Peon, NP Karle Plumber, PharmD   Please be sure to bring in all your medications bottles to every appointment.   Need to Contact us:  If you have any questions or concerns before your next appointment please send [place] a message through [place] or call our office at [phone].    TO LEAVE A MESSAGE FOR THE NURSE SELECT OPTION 2, PLEASE LEAVE A MESSAGE INCLUDING: YOUR NAME DATE OF BIRTH CALL BACK NUMBER REASON FOR CALL**this  is important as we prioritize the call backs  YOU WILL RECEIVE A CALL BACK THE SAME DAY AS LONG AS YOU CALL BEFORE 4:00 PM

## 2023-02-12 ENCOUNTER — Other Ambulatory Visit (HOSPITAL_BASED_OUTPATIENT_CLINIC_OR_DEPARTMENT_OTHER): Payer: Self-pay

## 2023-02-13 ENCOUNTER — Other Ambulatory Visit (HOSPITAL_BASED_OUTPATIENT_CLINIC_OR_DEPARTMENT_OTHER): Payer: Self-pay

## 2023-02-23 ENCOUNTER — Ambulatory Visit (HOSPITAL_BASED_OUTPATIENT_CLINIC_OR_DEPARTMENT_OTHER)
Admission: RE | Admit: 2023-02-23 | Discharge: 2023-02-23 | Disposition: A | Discharge status: Home / Self Care | Payer: Medicaid Other | Source: Ambulatory Visit | Attending: Cardiology | Admitting: Cardiology

## 2023-02-23 ENCOUNTER — Ambulatory Visit (HOSPITAL_COMMUNITY)
Admission: RE | Admit: 2023-02-23 | Discharge: 2023-02-23 | Disposition: A | Discharge status: Home / Self Care | Payer: Medicaid Other | Source: Ambulatory Visit | Attending: Cardiology | Admitting: Cardiology

## 2023-02-23 DIAGNOSIS — E119 Type 2 diabetes mellitus without complications: Secondary | ICD-10-CM | POA: Insufficient documentation

## 2023-02-23 DIAGNOSIS — I110 Hypertensive heart disease with heart failure: Secondary | ICD-10-CM | POA: Insufficient documentation

## 2023-02-23 DIAGNOSIS — I5022 Chronic systolic (congestive) heart failure: Secondary | ICD-10-CM

## 2023-02-23 DIAGNOSIS — I509 Heart failure, unspecified: Secondary | ICD-10-CM | POA: Insufficient documentation

## 2023-02-23 LAB — BASIC METABOLIC PANEL
Anion gap: 7 (ref 5–15)
BUN: 17 mg/dL (ref 8–23)
CO2: 22 mmol/L (ref 22–32)
Calcium: 8.9 mg/dL (ref 8.9–10.3)
Chloride: 112 mmol/L — ABNORMAL HIGH (ref 98–111)
Creatinine, Ser: 2.01 mg/dL — ABNORMAL HIGH (ref 0.61–1.24)
GFR, Estimated: 36 mL/min — ABNORMAL LOW (ref >60)
Glucose, Bld: 129 mg/dL — ABNORMAL HIGH (ref 70–99)
Potassium: 4.4 mmol/L (ref 3.5–5.1)
Sodium: 141 mmol/L (ref 135–145)

## 2023-02-23 NOTE — Progress Notes (Signed)
Echocardiogram 2D Echocardiogram has been performed.  Rodney Schultz 02/23/2023, 9:06 AM

## 2023-02-24 ENCOUNTER — Other Ambulatory Visit (HOSPITAL_COMMUNITY): Payer: Self-pay

## 2023-02-24 ENCOUNTER — Other Ambulatory Visit (HOSPITAL_BASED_OUTPATIENT_CLINIC_OR_DEPARTMENT_OTHER): Payer: Self-pay

## 2023-02-24 LAB — ECHOCARDIOGRAM COMPLETE
Area-P 1/2: 2.32 cm2
Calc EF: 44.2 %
S' Lateral: 3.5 cm
Single Plane A2C EF: 43.8 %
Single Plane A4C EF: 41.7 %

## 2023-02-27 ENCOUNTER — Other Ambulatory Visit (HOSPITAL_BASED_OUTPATIENT_CLINIC_OR_DEPARTMENT_OTHER): Payer: Self-pay

## 2023-03-04 IMAGING — CT CT HEAD W/O CM
3 series · 15 of 47 positions shown, 18 images · non-contrast
Comparison: None.

CLINICAL DATA: Headache, sudden and severe



[Series 3: head 5.0 h30s · axial · 0.43mm/px · z∈[+45,+175]mm · 9 of 32 slices shown, 12 images]
[im 3/32  brain]
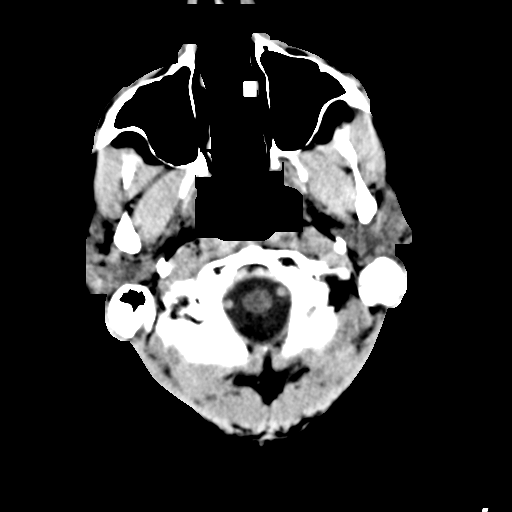
[im 3/32  bone]
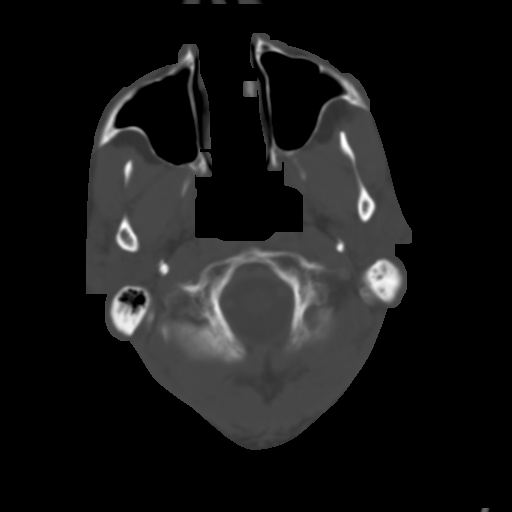
[im 6/32  brain]
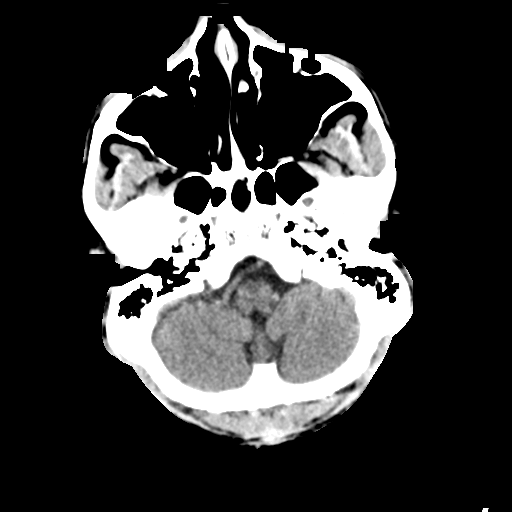
[im 9/32  brain]
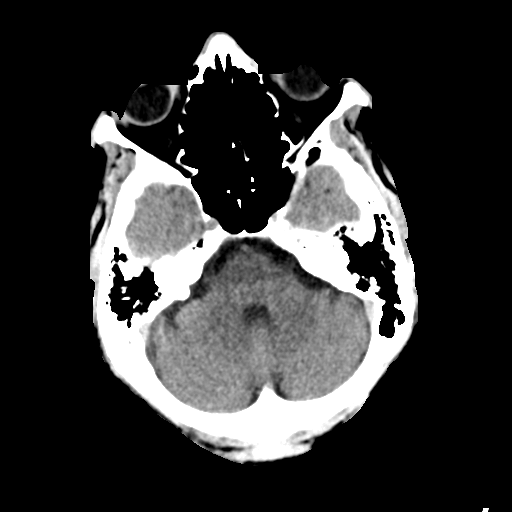
[im 12/32  brain]
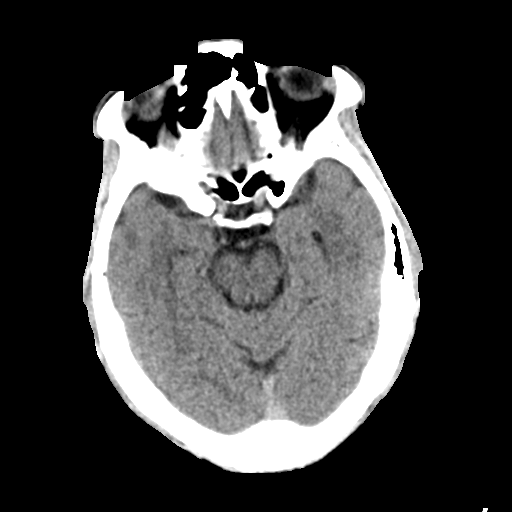
[im 17/32  brain]
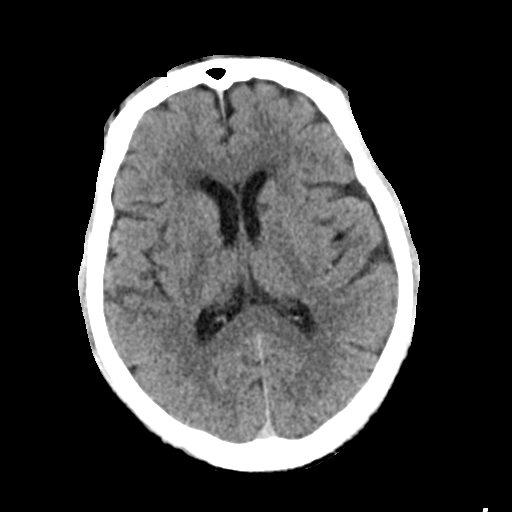
[im 17/32  bone]
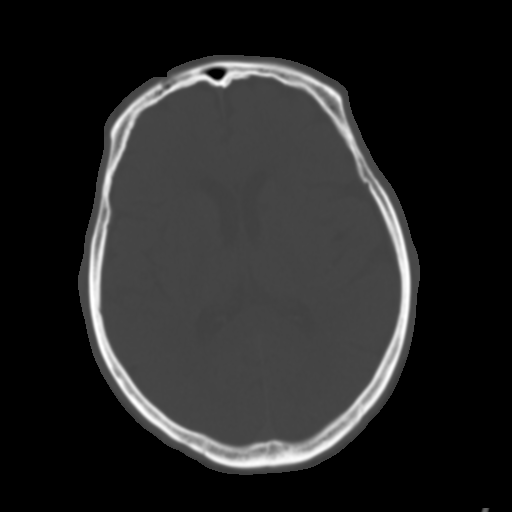
[im 20/32  brain]
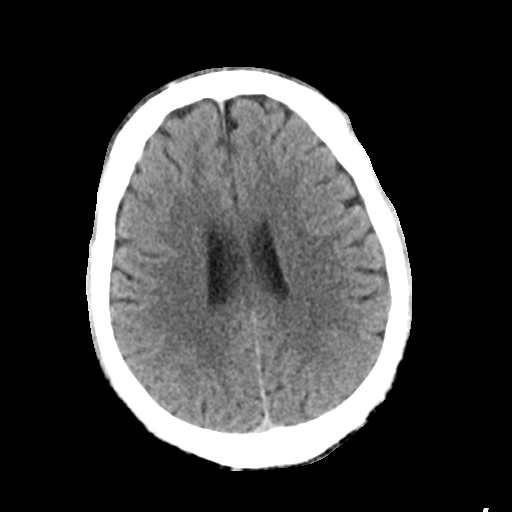
[im 23/32  brain]
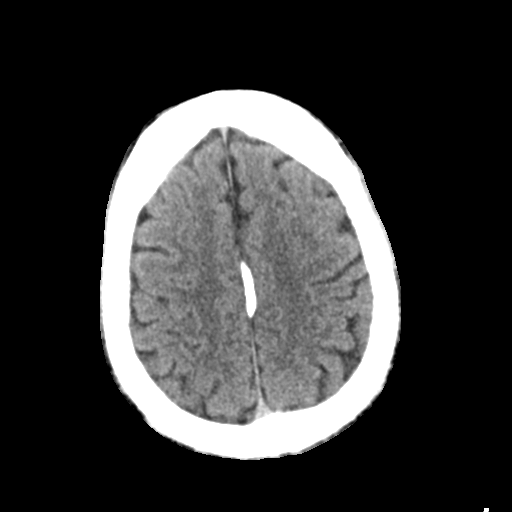
[im 26/32  brain]
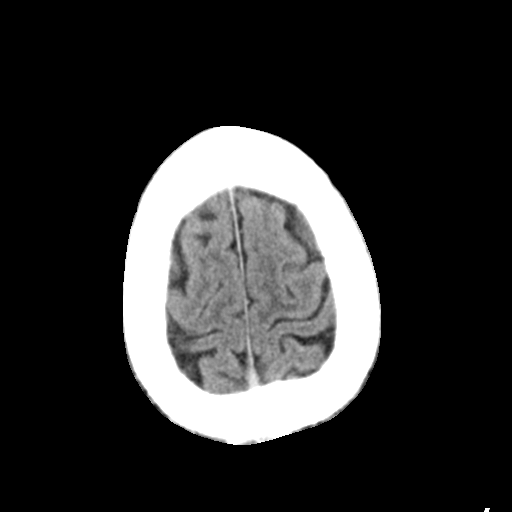
[im 29/32  brain]
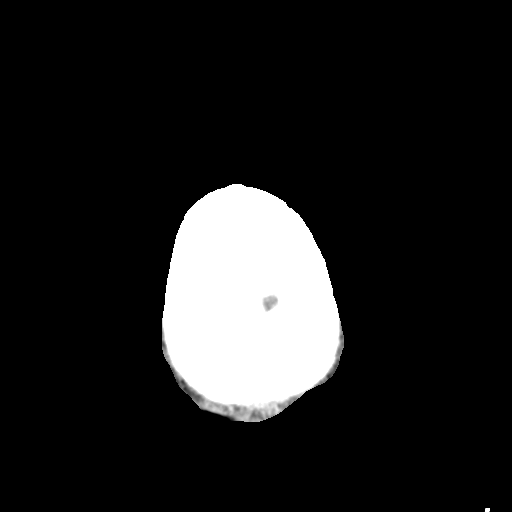
[im 29/32  bone]
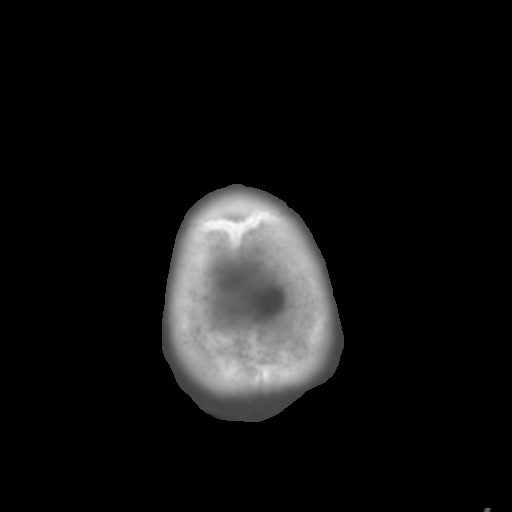

[Series 5: head 3.0 mpr cor · coronal · 0.31mm/px · 3 of 71 slices shown]
[im 24/71  brain]
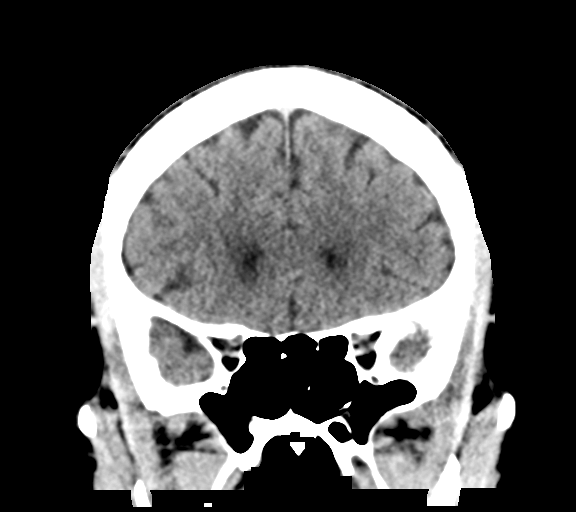
[im 32/71  brain]
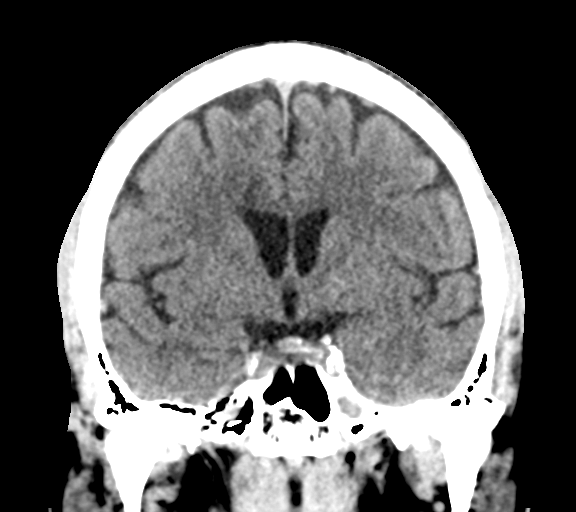
[im 39/71  brain]
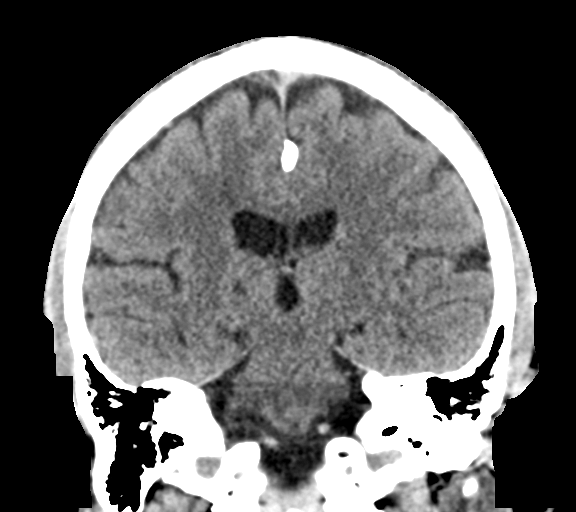

[Series 6: head 3.0 mpr sag · sagittal · 0.31mm/px · 3 of 64 slices shown]
[im 22/64  brain]
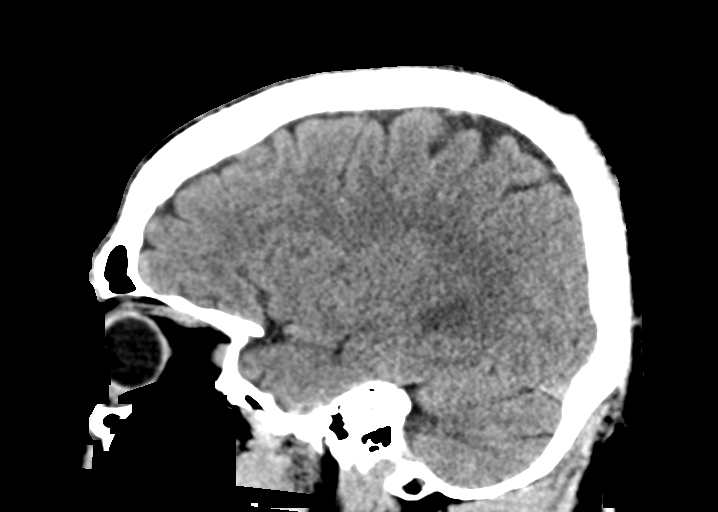
[im 32/64  brain]
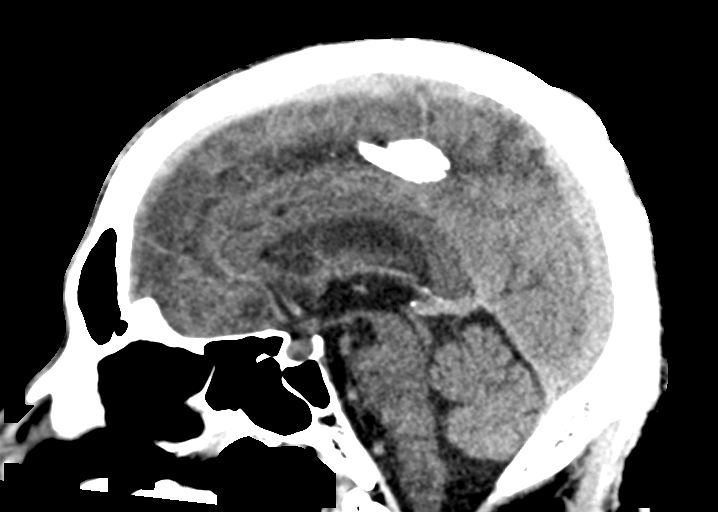
[im 43/64  brain]
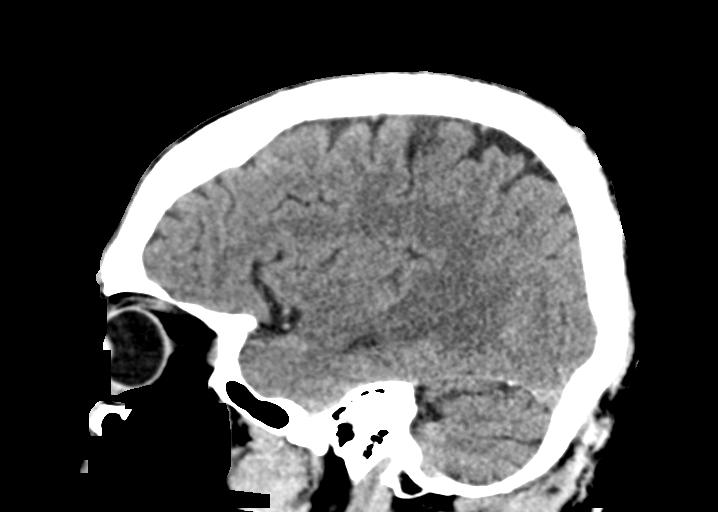

[15 of 47 positions shown; findings below may reference images not displayed]

FINDINGS: Brain: No evidence of acute infarction, hemorrhage, hydrocephalus,
extra-axial collection or mass lesion/mass effect. Hypodensity in
the left caudate head, likely representing prior lacunar infarct.

Vascular: No hyperdense vessel or unexpected calcification.

Skull: Normal. Negative for fracture or focal lesion.

Sinuses/Orbits: No acute finding.

Other: None.
IMPRESSION: 1.  No acute intracranial abnormality.

2.  Chronic lacunar infarct of the left caudate head.

## 2023-03-05 ENCOUNTER — Telehealth (HOSPITAL_COMMUNITY): Payer: Self-pay

## 2023-03-05 NOTE — Telephone Encounter (Signed)
Requested records faxed to [employer] on 03/05/23

## 2023-03-08 ENCOUNTER — Telehealth (HOSPITAL_COMMUNITY): Payer: Self-pay

## 2023-03-08 NOTE — Telephone Encounter (Addendum)
Received a fax requesting medical records from [employer]). Records were successfully faxed to: [phone] ,which was the number provided.. Medical request form will be scanned into patients chart.

## 2023-03-24 ENCOUNTER — Other Ambulatory Visit: Payer: Self-pay | Admitting: Internal Medicine

## 2023-03-24 ENCOUNTER — Other Ambulatory Visit (HOSPITAL_BASED_OUTPATIENT_CLINIC_OR_DEPARTMENT_OTHER): Payer: Self-pay

## 2023-03-24 MED ORDER — LEVETIRACETAM 250 MG PO TABS
250.0000 mg | ORAL_TABLET | Freq: Two times a day (BID) | ORAL | 2 refills | Status: DC
  Filled 2023-03-24: qty 60, 30d supply, fill #0

## 2023-04-14 ENCOUNTER — Ambulatory Visit (HOSPITAL_COMMUNITY)
Admission: RE | Admit: 2023-04-14 | Discharge: 2023-04-14 | Disposition: A | Discharge status: Home / Self Care | Payer: Medicaid Other | Source: Ambulatory Visit | Attending: Cardiology | Admitting: Cardiology

## 2023-04-14 DIAGNOSIS — I5022 Chronic systolic (congestive) heart failure: Secondary | ICD-10-CM | POA: Insufficient documentation

## 2023-04-14 LAB — LIPID PANEL
Cholesterol: 90 mg/dL (ref 0–200)
HDL: 20 mg/dL — ABNORMAL LOW (ref >40)
LDL Cholesterol: 41 mg/dL (ref 0–99)
Total CHOL/HDL Ratio: 4.5 RATIO
Triglycerides: 146 mg/dL (ref <150)
VLDL: 29 mg/dL (ref 0–40)

## 2023-04-16 LAB — HM DIABETES EYE EXAM

## 2023-04-19 ENCOUNTER — Other Ambulatory Visit (HOSPITAL_BASED_OUTPATIENT_CLINIC_OR_DEPARTMENT_OTHER): Payer: Self-pay

## 2023-04-19 ENCOUNTER — Other Ambulatory Visit: Payer: Self-pay

## 2023-04-19 ENCOUNTER — Telehealth: Payer: Self-pay

## 2023-04-19 NOTE — Telephone Encounter (Signed)
Prior Authorization for patient Rodney Schultz) came through on cover my meds was submitted with last office notes and labs awaiting approval or denial  KEY:[id number]

## 2023-04-19 NOTE — Telephone Encounter (Signed)
Decision:Approved Damian Leavell (Key: [id number]) PA Case ID #: [id number] Rx #: [id number] Need Help? Call us at [phone] Outcome Approved today by [facility] Medicaid 2017 Approved. This drug has been approved. Approved quantity: 30 units per 30 day(s). The drug has been approved from 04/05/2023 to 04/18/2024. Please call the pharmacy to process your prescription claim. Generic or biosimilar substitution may be required when available and preferred on the formulary. Authorization Expiration Date: 04/18/2024 Drug Jardiance 25MG  tablets ePA cloud logo Form [facility] Medicaid of [facility] Electronic Prior Authorization Request Form [phone] NCPDP) Original Claim Info 40

## 2023-04-21 ENCOUNTER — Other Ambulatory Visit (HOSPITAL_BASED_OUTPATIENT_CLINIC_OR_DEPARTMENT_OTHER): Payer: Self-pay

## 2023-04-22 ENCOUNTER — Other Ambulatory Visit (HOSPITAL_BASED_OUTPATIENT_CLINIC_OR_DEPARTMENT_OTHER): Payer: Self-pay

## 2023-04-27 ENCOUNTER — Other Ambulatory Visit (HOSPITAL_BASED_OUTPATIENT_CLINIC_OR_DEPARTMENT_OTHER): Payer: Self-pay

## 2023-04-29 ENCOUNTER — Encounter: Payer: Self-pay | Admitting: Dietician

## 2023-05-10 ENCOUNTER — Encounter: Payer: Self-pay | Admitting: Dietician

## 2023-05-13 ENCOUNTER — Other Ambulatory Visit (HOSPITAL_BASED_OUTPATIENT_CLINIC_OR_DEPARTMENT_OTHER): Payer: Self-pay

## 2023-05-14 ENCOUNTER — Other Ambulatory Visit (HOSPITAL_BASED_OUTPATIENT_CLINIC_OR_DEPARTMENT_OTHER): Payer: Self-pay

## 2023-05-25 ENCOUNTER — Other Ambulatory Visit (HOSPITAL_BASED_OUTPATIENT_CLINIC_OR_DEPARTMENT_OTHER): Payer: Self-pay

## 2023-05-27 ENCOUNTER — Telehealth (HOSPITAL_COMMUNITY): Payer: Self-pay

## 2023-05-27 NOTE — Telephone Encounter (Signed)
Forms faxed to [employer] on 05/27/23 at [phone]. Left message for patient that forms were sent in.

## 2023-06-01 ENCOUNTER — Ambulatory Visit: Payer: Medicaid Other | Admitting: Student

## 2023-06-01 ENCOUNTER — Other Ambulatory Visit (HOSPITAL_BASED_OUTPATIENT_CLINIC_OR_DEPARTMENT_OTHER): Payer: Self-pay

## 2023-06-01 ENCOUNTER — Other Ambulatory Visit: Payer: Self-pay

## 2023-06-01 ENCOUNTER — Encounter: Payer: Self-pay | Admitting: Student

## 2023-06-01 VITALS — BP 133/84 | HR 61 | Temp 98.1°F | Resp 24 | Ht 70.0 in | Wt 190.0 lb

## 2023-06-01 DIAGNOSIS — I10 Essential (primary) hypertension: Secondary | ICD-10-CM | POA: Diagnosis not present

## 2023-06-01 DIAGNOSIS — Z87891 Personal history of nicotine dependence: Secondary | ICD-10-CM

## 2023-06-01 DIAGNOSIS — E1165 Type 2 diabetes mellitus with hyperglycemia: Secondary | ICD-10-CM

## 2023-06-01 DIAGNOSIS — Z794 Long term (current) use of insulin: Secondary | ICD-10-CM | POA: Diagnosis not present

## 2023-06-01 DIAGNOSIS — E118 Type 2 diabetes mellitus with unspecified complications: Secondary | ICD-10-CM

## 2023-06-01 DIAGNOSIS — Z7984 Long term (current) use of oral hypoglycemic drugs: Secondary | ICD-10-CM

## 2023-06-01 DIAGNOSIS — I5020 Unspecified systolic (congestive) heart failure: Secondary | ICD-10-CM | POA: Diagnosis not present

## 2023-06-01 DIAGNOSIS — I5022 Chronic systolic (congestive) heart failure: Secondary | ICD-10-CM

## 2023-06-01 LAB — POCT GLYCOSYLATED HEMOGLOBIN (HGB A1C): Hemoglobin A1C: 6.4 % — AB (ref 4.0–5.6)

## 2023-06-01 LAB — GLUCOSE, CAPILLARY: Glucose-Capillary: 110 mg/dL — ABNORMAL HIGH (ref 70–99)

## 2023-06-01 MED ORDER — VARENICLINE TARTRATE 0.5 MG PO TABS
0.5000 mg | ORAL_TABLET | Freq: Two times a day (BID) | ORAL | 1 refills | Status: DC
Start: 2023-06-01 — End: 2023-10-26
  Filled 2023-06-01: qty 60, 30d supply, fill #0

## 2023-06-01 MED ORDER — INSULIN GLARGINE 100 UNIT/ML SOLOSTAR PEN
8.0000 [IU] | PEN_INJECTOR | Freq: Every day | SUBCUTANEOUS | 11 refills | Status: DC
Start: 2023-06-01 — End: 2024-01-13
  Filled 2023-06-01: qty 2.4, 30d supply, fill #0
  Filled 2023-06-11: qty 9, 90d supply, fill #0
  Filled 2023-06-11: qty 3, 28d supply, fill #0
  Filled 2023-09-04: qty 9, 90d supply, fill #1
  Filled 2023-11-17: qty 9, 90d supply, fill #2

## 2023-06-01 NOTE — Patient Instructions (Addendum)
Thank you, Mr.Kacen L Thoma for allowing [place] to provide your care today. Today we discussed diabetes, resources for smoking cessation, and the importance of taking all of the medications prescribed.    I have ordered the following labs for you:   Lab Orders         POC Hbg A1C        I have ordered the following medication/changed the following medications:   Stop the following medications: There are no discontinued medications.   Start the following medications: Meds ordered this encounter  Medications   varenicline (CHANTIX) 0.5 MG tablet    Sig: Take 1 tablet (0.5 mg total) by mouth 2 (two) times daily.    Dispense:  60 tablet    Refill:  1  Our plan is to decrease the insulin Lantus injection to 8 units once a day if your A1c is decreasing.  After we have the results of your A1c testing today, I will call you and go over the results.  Follow up: 3 months      Should you have any questions or concerns please call the internal medicine clinic at [phone].     Faith Rogue, D.O. [facility]

## 2023-06-01 NOTE — Assessment & Plan Note (Signed)
Patient reports smoking around 1 cigarette/week and stated that he would like to stop smoking cigarettes.  Patient is interested in beginning medication for smoking cessation.  Due to history of seizures, we will avoid Wellbutrin.  Patient understood the risk and benefits of starting Chantix at a low dose. Plan: -Begin Chantix 0.5 mg twice daily

## 2023-06-01 NOTE — Assessment & Plan Note (Signed)
Patient presents with well-controlled type 2 diabetes mellitus on a regimen of metformin 500 mg twice daily and Lantus 10 units daily along with Jardiance 25 mg daily.  Per dispense history it appears that he has not refilled his metformin however patient stated that the pharmacy gave him numerous bottles of each prescription, and that he is compliant with his medications.  His A1c today does show continued well-controlled diabetes with an A1c of 6.4.  He denies symptoms of hypoglycemia and hyperglycemia. Plan: -Continue Jardiance 25 mg and metformin 500 mg twice daily -Will decrease Lantus to 8 units daily and recheck A1c in 3 months

## 2023-06-01 NOTE — Progress Notes (Signed)
Established Patient Office Visit  Subjective   Patient ID: Rodney Schultz, male    DOB: November 05, 1960  Age: 62 y.o. MRN: [id number]  Chief Complaint  Patient presents with   Follow-up    Diabetes     Rodney Schultz is a 62 year old male who presented to the clinic for follow-up of T2DM. Please see problem based assessment and plan for additional details.   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  Gastrointestinal:  Negative for abdominal pain.      Objective:     BP 133/84 (BP Location: Left Arm, Patient Position: Standing, Cuff Size: Normal)   Pulse 61   Temp 98.1 F (36.7 C) (Oral)   Resp (!) 24   Ht 5\' 10"  (1.778 m)   Wt 190 lb (86.2 kg)   SpO2 100% Comment: room air  BMI 27.26 kg/m  BP Readings from Last 3 Encounters:  06/01/23 133/84  02/11/23 118/78  01/20/23 (!) 164/90      Physical Exam Vitals and nursing note reviewed.  Constitutional:      General: He is not in acute distress.    Appearance: He is not ill-appearing, toxic-appearing or diaphoretic.  Cardiovascular:     Rate and Rhythm: Normal rate. Rhythm irregularly irregular.     Heart sounds: Normal heart sounds.  Pulmonary:     Effort: Pulmonary effort is normal.     Breath sounds: Normal breath sounds.  Abdominal:     Palpations: Abdomen is soft.      Tenderness: There is no abdominal tenderness.  Musculoskeletal:     Right lower leg: No edema.     Left lower leg: No edema.  Skin:    General: Skin is warm and dry.  Neurological:     Mental Status: He is alert.  Psychiatric:        Mood and Affect: Mood normal.      Results for orders placed or performed in visit on 06/01/23  Glucose, capillary  Result Value Ref Range   Glucose-Capillary 110 (H) 70 - 99 mg/dL  POC Hbg I6N  Result Value Ref Range   Hemoglobin A1C 6.4 (A) 4.0 - 5.6 %   HbA1c POC (<> result, manual entry)     HbA1c, POC (prediabetic range)     HbA1c, POC (controlled diabetic range)      Last metabolic panel Lab Results  Component Value Date   GLUCOSE 129 (H) 02/23/2023   NA 141 02/23/2023   K 4.4 02/23/2023   CL 112 (H) 02/23/2023   CO2 22 02/23/2023   BUN 17 02/23/2023   CREATININE 2.01 (H) 02/23/2023   GFRNONAA 36 (L) 02/23/2023   CALCIUM 8.9 02/23/2023   PHOS 4.1 08/21/2022   PROT 8.4 (H) 08/19/2022   ALBUMIN 3.8 08/19/2022   BILITOT 1.0 08/19/2022   ALKPHOS 108 08/19/2022   AST 24  08/19/2022   ALT 16 08/19/2022   ANIONGAP 7 02/23/2023   Last lipids Lab Results  Component Value Date   CHOL 90 04/14/2023   HDL 20 (L) 04/14/2023   LDLCALC 41 04/14/2023   LDLDIRECT 73 08/20/2022   TRIG 146 04/14/2023   CHOLHDL 4.5 04/14/2023   Last hemoglobin A1c Lab Results  Component Value Date   HGBA1C 6.4 (A) 06/01/2023      The ASCVD Risk score (Arnett DK, et al., 2019) failed to calculate for the following reasons:   The patient has a prior MI or stroke diagnosis    Assessment & Plan:   Problem List Items Addressed This Visit       Cardiovascular and Mediastinum   Essential hypertension, benign (Chronic)    Patient presents with a history of previously controlled hypertension.  His blood pressure upon arrival was initially elevated which patient attributes this to smoke a cigarette on his way here.  Repeat blood pressure does show  improvement to 133/84, but blood pressure is not quite at the goal of less than 130/80 which is most likely due to current tobacco use.  Plan: -Continue Jardiance 25 mg -Continue Entresto 49-51 mg twice daily -Discussed tobacco cessation please see below      Chronic HFrEF (heart failure with reduced ejection fraction) (HCC) (Chronic)    Patient is currently followed by cardiology for his HFrEF with a recent echocardiogram (02/2023) showing an EF of 40 to 45%.  Will continue his GDMT consisting of Coreg 6.25 mg twice daily, Entresto 49-59 mg twice daily, Jardiance 25 mg.  Patient also continues to take hydralazine 3 times daily and reports using torsemide 20 mg as needed for leg edema (about 2 times a week) Plan: -Continue GDMT and as needed torsemide for leg edema -Will recheck BMP in 3 months at follow-up appointment        Endocrine   Type 2 diabetes with complication (HCC) - Primary (Chronic)    Patient presents with well-controlled type 2 diabetes mellitus on a regimen of metformin 500 mg twice daily and Lantus 10 units daily along with Jardiance 25 mg daily.  Per dispense history it appears that he has not refilled his metformin however patient stated that the pharmacy gave him numerous bottles of each prescription, and that he is compliant with his medications.  His A1c today does show continued well-controlled diabetes with an A1c of 6.4.  He denies symptoms of hypoglycemia and hyperglycemia. Plan: -Continue Jardiance 25 mg and metformin 500 mg twice daily -Will decrease Lantus to 8 units daily and recheck A1c in 3 months      Relevant Medications   insulin glargine (LANTUS) 100 UNIT/ML Solostar Pen   Other Relevant Orders   POC Hbg A1C (Completed)     Other   History of tobacco abuse    Patient reports smoking around 1 cigarette/week and stated that he would like to stop smoking cigarettes.  Patient is interested in beginning medication for smoking cessation.  Due to history of  seizures, we will avoid Wellbutrin.  Patient understood the risk and benefits of starting Chantix at a low dose. Plan: -Begin Chantix 0.5 mg twice daily      Other Visit Diagnoses     Type 2 diabetes mellitus with hyperglycemia, unspecified whether long term insulin use (HCC)       Relevant Medications   insulin glargine (LANTUS) 100 UNIT/ML Solostar Pen       Return in about 3 months (around  09/01/2023) for DM.   Seen with Dr. Elberta Leatherwood, DO

## 2023-06-01 NOTE — Assessment & Plan Note (Signed)
Patient presents with a history of previously controlled hypertension.  His blood pressure upon arrival was initially elevated which patient attributes this to smoke a cigarette on his way here.  Repeat blood pressure does show improvement to 133/84, but blood pressure is not quite at the goal of less than 130/80 which is most likely due to current tobacco use.  Plan: -Continue Jardiance 25 mg -Continue Entresto 49-51 mg twice daily -Discussed tobacco cessation please see below

## 2023-06-01 NOTE — Assessment & Plan Note (Addendum)
Patient is currently followed by cardiology for his HFrEF with a recent echocardiogram (02/2023) showing an EF of 40 to 45%.  Will continue his GDMT consisting of Coreg 6.25 mg twice daily, Entresto 49-59 mg twice daily, Jardiance 25 mg.  Patient also continues to take hydralazine 3 times daily and reports using torsemide 20 mg as needed for leg edema (about 2 times a week) Plan: -Continue GDMT and as needed torsemide for leg edema -Will recheck BMP in 3 months at follow-up appointment

## 2023-06-02 ENCOUNTER — Other Ambulatory Visit: Payer: Self-pay | Admitting: Student

## 2023-06-02 DIAGNOSIS — N509 Disorder of male genital organs, unspecified: Secondary | ICD-10-CM

## 2023-06-02 NOTE — Progress Notes (Signed)
While notifying patient of his lab results, he reported a "testicular issue" he would not go into further detail on what he meant by that.  He is requesting an urology referral to [facility] be evaluated. I encouraged him to make an appointment with the clinic to be evaluated as well; however, he declined. I also informed him that I can send the referral to other urologists if needed.

## 2023-06-03 NOTE — Progress Notes (Signed)
 Internal Medicine Clinic Attending  I was physically present during the key portions of the resident provided service and participated in the medical decision making of patient's management care. I reviewed pertinent patient test results.  The assessment, diagnosis, and plan were formulated together and I agree with the documentation in the resident's note.  Inez Catalina, MD

## 2023-06-11 ENCOUNTER — Other Ambulatory Visit (HOSPITAL_BASED_OUTPATIENT_CLINIC_OR_DEPARTMENT_OTHER): Payer: Self-pay

## 2023-06-11 ENCOUNTER — Other Ambulatory Visit: Payer: Self-pay

## 2023-06-11 ENCOUNTER — Other Ambulatory Visit: Payer: Self-pay | Admitting: Internal Medicine

## 2023-06-11 DIAGNOSIS — I63512 Cerebral infarction due to unspecified occlusion or stenosis of left middle cerebral artery: Secondary | ICD-10-CM

## 2023-06-11 DIAGNOSIS — E1165 Type 2 diabetes mellitus with hyperglycemia: Secondary | ICD-10-CM

## 2023-06-11 MED ORDER — ATORVASTATIN CALCIUM 80 MG PO TABS
80.0000 mg | ORAL_TABLET | Freq: Every day | ORAL | 2 refills | Status: DC
Start: 2023-06-11 — End: 2023-12-16
  Filled 2023-06-11: qty 30, 30d supply, fill #0
  Filled 2023-08-03: qty 30, 30d supply, fill #1
  Filled 2023-10-30: qty 30, 30d supply, fill #2

## 2023-06-11 MED ORDER — EMPAGLIFLOZIN 25 MG PO TABS
25.0000 mg | ORAL_TABLET | Freq: Every day | ORAL | 2 refills | Status: DC
Start: 2023-06-11 — End: 2024-01-13
  Filled 2023-06-11: qty 30, 30d supply, fill #0
  Filled 2023-08-03: qty 30, 30d supply, fill #1
  Filled 2023-12-16: qty 30, 30d supply, fill #2

## 2023-06-11 NOTE — Progress Notes (Signed)
ADVANCED HF CLINIC NOTE   Primary Care: Gwenevere Abbot, MD HF Cardiologist: Dr. Shirlee Latch  HPI: Rodney Schultz is a 62 y.o. AAM w/ h/o HTN, T2DM and HLD, admitted to [facility] w/ new systolic heart failure in 11/22.    He reports long history of poorly controlled HTN in the past. Baseline SBPs would average in the 170s. Denies ETOH use. Former smoker but has quit. No known family h/o CHF. No h/o ischemic-like CP. No known h/o COVID but reports having the flu prior to his 11/22 admission.     Admitted 11/22 w/ new CHF, hypertensive + AKI w/ SCr in the 2.0 range (previous baseline ~1.5). Echo w/ severe biventricular dysfunction, LVEF 20-25% with diffuse hypokinesis, mod LVH, GIIIDD (restrictive), RV severely reduced, moderate MR. R/LHC was deferred initially due to renal insufficiency. He was diuresed w/ IV Lasix. GDMT w/ hydralazine, Imdur and Coreg initiated. Discharge wt 177 lb. Referred to TOC.    Seen 1/23 in HF clinic to establish care for his CHF. R/LHC showed mildly elevated PCWP, pulmonary venous hypertension, preserved CO and diffuse CAD w/ occluded large ramus and moderate D1. 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 No good PCI options and he does not have good targets for CABG (would be high risk CABG with low EF).  Medically managed in setting of no anginal chest pain.   Echo in 4/23 showed EF 25-30% with moderate LVH and normal RV, mild MR. Echo 8/23 showed EF 35% with diffuse hypokinesis and akinesis of the mid inferolateral and mid anterolateral walls, normal RV.   Cardiac MRI in 11/23 showed LV EF 42% with RV EF 52%, there was >50% wall thickness subendocardial LGE in the mid-apical inferolateral and anterolateral walls suggestive of prior MI.   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  Carotid dopplers in 2/24 showed only mild stenosis.  He also had AKI on CKD stage  3.  Supposedly he had atrial fibrillation by ECG but the ECGs in the system all show NSR (though one is labeled atrial fibrillation).  He was started on apixaban.  Imdur, spironolactone, and Coreg were stopped.  Echo in 2/24 showed EF up to 55-60% with mild LVH and normal RV.   Unfortunately, despite a nebulous diagnosis of atrial fibrillation and multiple cardiac med adjustments, cardiology was not consulted during this admission.    Today he returns for HF follow up. Weight down 6 lbs. We are not sure if he is taking Imdur or spironolactone.  They are not on his list but he thinks he has them at home. He is doing well symptomatically, no significant exertional dyspnea.  Has been able to do all his activities without problems. He did have an episode of mild chest tightness last week while mowing his grass that resolved with rest.  This is the only time he has had chest pain in recent months. No BRBPR/melena.  He takes torsemide once every 2-3 weeks.   ECG (personally reviewed): NSR, inferolateral ST-T changes  Labs (1/23): K 4.3, creatinine 1.95 Labs (2/23): K 4.4, creatinine 1.75 Labs (4/23): K 4.4, creatinine 2.27, BNP 45, LDL 123, TGs 409 Labs (6/23): K 5.0,  creatinine 1.7 Labs (9/23): K 5.0, creatinine 2.19 Labs (2/24): K 3.6, creatinine 1.88, TGs 514 Labs (7/24): K 4.5, creatinine 1.92, LDL 65, TGs 209  PMH: 1. Type 2 diabetes 2. HTN: Poorly controlled.  -  Renal artery dopplers: Mild renal artery stenosis.   3. GERD 4. CKD stage 3 5. Hyperlipidemia 6. Former smoker 7. CAD: LHC (1/23) with occluded large ramu, occluded moderate D2, 90% dLAD, 80% dLCx, diffuse RCA disease reaching 80% in the mid vessel and 80% in the distal vessel.  No good targets for CABG, and reviewed by interventional cardiology who thought no good interventional targets given absence of anginal pain.  8. 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  - Echo (11/22): EF 20-25%, severe  LV dysfunction with global HK, moderate LVH, grade III DD (restrictive), RV severely reduced, moderate MR  - RHC (1/23): mean RA 8, PA 54/17 mean 32, mean PCWP 18, CI 2.54, PVR 2.8 WU (pulmonary venous hypertension).  - Echo (4/23): EF 25-30% with moderate LVH and normal RV, mild MR. - Echo (8/23): EF 35% with diffuse hypokinesis and akinesis of the mid inferolateral and mid anterolateral walls, normal RV.  - Cardiac MRI (11/23): LV EF 42% with RV EF 52%, there was >50% wall thickness subendocardial LGE in the mid-apical inferolateral and anterolateral walls suggestive of prior MI.  - Echo (2/24): EF 55-60%, mild LVH, normal RV.  9. OSA: Mild on 2/23 sleep study.  10. Left renal mass: Likely cyst by renal US.  11. CVA: 1/24, ?related to atrial fibrillation. 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 - Carotid dopplers (2/24) with mild disease.  12. Atrial fibrillation: Paroxysmal.  Somewhat questionable diagnosis at time of CVA in 1/24.   Current Outpatient Medications  Medication Sig Dispense Refill   apixaban (ELIQUIS) 5 MG TABS tablet Take 1 tablet (5 mg total) by mouth 2 (two) times daily. 60 tablet 2   atorvastatin (LIPITOR) 80 MG tablet Take 1 tablet (80 mg total) by mouth daily. 30 tablet 2   Blood Glucose Monitoring Suppl (ACCU-CHEK GUIDE) w/Device KIT Use as directed twice daily. 1 kit 0   carvedilol (COREG) 6.25 MG tablet Take 1 tablet (6.25 mg total) by mouth 2 (two) times daily. 180 tablet 3   empagliflozin (JARDIANCE) 25 MG TABS tablet Take 1 tablet (25 mg total) by mouth daily before breakfast. 30 tablet 2   ezetimibe (ZETIA) 10 MG tablet Take 1 tablet (10 mg total) by mouth daily. 90 tablet 3   glucose blood test strip Use three times daily to check blood sugars 100 each 12   hydrALAZINE (APRESOLINE) 50 MG tablet Take 1 tablet (50 mg total) by mouth 3 (three) times daily. 90 tablet 3   icosapent Ethyl (VASCEPA) 1 g capsule Take 2 capsules (2 g total) by mouth 2 (two) times  daily. 120 capsule 6   insulin glargine (LANTUS) 100 UNIT/ML Solostar Pen Inject 8 Units into the skin daily. Discard pen 28 days after first use 3 mL 11   Insulin Pen Needle (UNIFINE PENTIPS) 32G X 4 MM MISC Use daily with insulin 100 each 2   isosorbide mononitrate (IMDUR) 30 MG 24 hr tablet Take 1 tablet (30 mg total) by mouth daily. 90 tablet 3   Lancets Micro Thin 33G MISC Use to check blood sugar twice daily 100 each 1   levETIRAcetam (KEPPRA) 250 MG tablet Take 1 tablet (250 mg total) by mouth 2 (two) times daily. 60 tablet 2   metFORMIN (GLUCOPHAGE-XR) 500 MG 24 hr tablet Take 1 tablet (500 mg total) by mouth 2 (two) times daily. 60 tablet 2   sacubitril-valsartan (ENTRESTO) 49-51 MG Take 1 tablet by mouth 2 (two) times daily. 60  tablet 2   spironolactone (ALDACTONE) 25 MG tablet Take 1 tablet (25 mg total) by mouth daily. 90 tablet 3   torsemide (DEMADEX) 20 MG tablet Take 1 tablet (20 mg total) by mouth daily as needed for extra fluid. 30 tablet 2   varenicline (CHANTIX) 0.5 MG tablet Take 1 tablet (0.5 mg total) by mouth 2 (two) times daily. 60 tablet 1   No current facility-administered medications for this visit.   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, [employer] "yard dog"  Tobacco Use   Smoking status: Some Days    Current packs/day: 0.10    Average packs/day: 0.1 packs/day for 35.4 years (3.5 ttl pk-yrs)    Types: Cigarettes    Start date: 09/02/2021   Smokeless tobacco: Never   Tobacco comments:    "Once in a blue moon"      Vaping Use   Vaping status: Never Used  Substance and Sexual Activity   Alcohol use: Not Currently   Drug use: Never    Sexual activity: Not on file  Other Topics Concern   Not on file  Social History Narrative   Not on file   Social Determinants of Health   Financial Resource Strain: Low Risk  (01/20/2023)   Overall Financial Resource Strain (CARDIA)    Difficulty of Paying Living Expenses: Not hard at all  Food Insecurity: No Food Insecurity (01/20/2023)   Hunger Vital Sign    Worried About Running Out of Food in the Last Year: Never true    Ran Out of Food in the Last Year: Never true  Transportation Needs: No Transportation Needs (01/20/2023)   PRAPARE - Administrator, Civil Service (Medical): No    Lack of Transportation (Non-Medical): No  Physical Activity: Sufficiently Active (01/20/2023)   Exercise Vital Sign    Days of Exercise per Week: 7 days    Minutes of Exercise per Session: 90 min  Stress: No Stress Concern Present (01/20/2023)   [employer] of Occupational Health - Occupational Stress Questionnaire    Feeling of Stress : Not at all  Social Connections: Moderately Isolated (01/20/2023)   Social Connection and Isolation Panel [NHANES]    Frequency of Communication with Friends and Family: Once a week    Frequency of Social Gatherings with Friends and Family: Twice a week    Attends Religious Services: More than 4 times per year    Active Member of [employer] or Organizations: No    Attends Banker Meetings: Never    Marital Status: Widowed  Intimate Partner Violence: Not At Risk (01/20/2023)   Humiliation, Afraid, Rape, and Kick questionnaire    Fear of Current or Ex-Partner: No    Emotionally Abused: No    Physically Abused: No    Sexually Abused: No   Family History  Problem Relation Age of Onset   Healthy Mother    There were no vitals taken for this visit.  Wt Readings from Last 3 Encounters:  06/01/23 86.2 kg (190 lb)  02/11/23 87 kg (191 lb 12.8 oz)  01/20/23 88.7 kg (195  lb 9.6 oz)   PHYSICAL EXAM: General: NAD Neck: No JVD, no thyromegaly or  thyroid nodule.  Lungs: Clear to auscultation bilaterally with normal respiratory effort. CV: Nondisplaced PMI.  Heart regular S1/S2, no S3/S4, no murmur.  No peripheral edema.  No carotid bruit.  Normal pedal pulses.  Abdomen: Soft, nontender, no hepatosplenomegaly, no distention.  Skin: Intact without lesions or rashes.  Neurologic: Alert and oriented x 3.  Psych: Normal affect. Extremities: No clubbing or cyanosis.  HEENT: Normal.   ASSESSMENT & PLAN: 1. HTN: Long history.  Renal artery dopplers in 5/23 with mild renal artery stenosis. BP now seems controlled.  2. 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 Echo (11/22) w/ severe biventricular dysfunction, LVEF 20-25%, Mod LVH, GIIIDD (restrictive), RV severely reduced, diffuse HK. Underwent R/LHC (1/23) showing significant CAD but no good targets for CABG.  Echo in 4/23 showed EF persistently low at 25-30%. Echo 8/23 showed EF 35% with diffuse hypokinesis and akinesis of the mid inferolateral and mid anterolateral walls, normal RV.  Cardiac MRI in 11/23 showed LV EF 42% with RV EF 52%, there was >50% wall thickness subendocardial LGE in the mid-apical inferolateral and anterolateral walls suggestive of prior MI.  Interestingly, echo in 2/24 during admission for CVA showed LV EF up to 55-60% with normal RV.  NYHA class I-II, not volume overloaded on exam.  - Continue Jardiance 25 mg daily.  - Continue Coreg 6.25 mg bid.  - Continue Entresto 97/103 bid.  - He is going to make sure he is taking spironolactone 25 mg daily.  If not, he will restart.  - He does not appear to need a diuretic.   - Continue hydralazine 50 mg tid (cannot tolerate higher dose) and he will make sure that he is taking Imdur 30 mg daily.  - Repeat echo to make sure EF remains in normal range.   3. CAD: LHC (1/23) with diffuse disease occluded large ramus and moderate D1. 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 No  good PCI options in setting of diffuse disease and no chest pain, and he does not have good targets for CABG (would be high risk CABG with low EF).  He had 1 episodes of transient chest pain recently.  Continue medical management.  - No ASA given Eliquis use.  - Continue atorvastatin.  4. CKD, Stage III: BMET today.  5. Hyperlipidemia: He is on atorvastatin.   - Add Zetia 10 mg daily to get LDL < 55.  Check lipids in 2 months.  6. Atrial fibrillation: CVA in 2/24 and apparently atrial fibrillation was seen in the hospital.  All ECGs in system show NSR, one is labeled AF but is NSR.  I am not totally sure of this diagnosis, but given recent CVA, will keep him on Eliquis.  - Continue Eliquis. No bleeding issues. Recent CBC stable. 7. CVA: In 2/24, no residual neurological symptoms/signs.  ?AF related.  Carotid dopplers in 2/24 showed no significant stenosis.  - He will continue Eliquis.   Follow up in 4 months with APP.   Anderson Malta [facility]  06/11/2023

## 2023-06-14 ENCOUNTER — Ambulatory Visit (HOSPITAL_COMMUNITY)
Admission: RE | Admit: 2023-06-14 | Discharge: 2023-06-14 | Disposition: A | Discharge status: Home / Self Care | Payer: Medicaid Other | Source: Ambulatory Visit | Attending: Family Medicine | Admitting: Family Medicine

## 2023-06-14 ENCOUNTER — Encounter (HOSPITAL_COMMUNITY): Payer: Self-pay

## 2023-06-14 VITALS — BP 122/88 | HR 57 | Wt 190.6 lb

## 2023-06-14 DIAGNOSIS — Z87891 Personal history of nicotine dependence: Secondary | ICD-10-CM | POA: Diagnosis not present

## 2023-06-14 DIAGNOSIS — I5022 Chronic systolic (congestive) heart failure: Secondary | ICD-10-CM | POA: Diagnosis present

## 2023-06-14 DIAGNOSIS — Z8673 Personal history of transient ischemic attack (TIA), and cerebral infarction without residual deficits: Secondary | ICD-10-CM | POA: Diagnosis not present

## 2023-06-14 DIAGNOSIS — I2510 Atherosclerotic heart disease of native coronary artery without angina pectoris: Secondary | ICD-10-CM | POA: Insufficient documentation

## 2023-06-14 DIAGNOSIS — I130 Hypertensive heart and chronic kidney disease with heart failure and stage 1 through stage 4 chronic kidney disease, or unspecified chronic kidney disease: Secondary | ICD-10-CM | POA: Insufficient documentation

## 2023-06-14 DIAGNOSIS — N183 Chronic kidney disease, stage 3 unspecified: Secondary | ICD-10-CM | POA: Diagnosis not present

## 2023-06-14 DIAGNOSIS — E1122 Type 2 diabetes mellitus with diabetic chronic kidney disease: Secondary | ICD-10-CM | POA: Insufficient documentation

## 2023-06-14 DIAGNOSIS — N1832 Chronic kidney disease, stage 3b: Secondary | ICD-10-CM

## 2023-06-14 DIAGNOSIS — R9431 Abnormal electrocardiogram [ECG] [EKG]: Secondary | ICD-10-CM | POA: Insufficient documentation

## 2023-06-14 DIAGNOSIS — E785 Hyperlipidemia, unspecified: Secondary | ICD-10-CM | POA: Insufficient documentation

## 2023-06-14 DIAGNOSIS — I480 Paroxysmal atrial fibrillation: Secondary | ICD-10-CM | POA: Insufficient documentation

## 2023-06-14 DIAGNOSIS — I10 Essential (primary) hypertension: Secondary | ICD-10-CM

## 2023-06-14 DIAGNOSIS — I110 Hypertensive heart disease with heart failure: Secondary | ICD-10-CM | POA: Diagnosis present

## 2023-06-14 DIAGNOSIS — I639 Cerebral infarction, unspecified: Secondary | ICD-10-CM

## 2023-06-14 DIAGNOSIS — Z7901 Long term (current) use of anticoagulants: Secondary | ICD-10-CM | POA: Diagnosis not present

## 2023-06-14 DIAGNOSIS — E119 Type 2 diabetes mellitus without complications: Secondary | ICD-10-CM | POA: Diagnosis present

## 2023-06-14 LAB — COMPREHENSIVE METABOLIC PANEL
ALT: 11 U/L (ref 0–44)
AST: 15 U/L (ref 15–41)
Albumin: 3.5 g/dL (ref 3.5–5.0)
Alkaline Phosphatase: 71 U/L (ref 38–126)
Anion gap: 6 (ref 5–15)
BUN: 22 mg/dL (ref 8–23)
CO2: 26 mmol/L (ref 22–32)
Calcium: 9.2 mg/dL (ref 8.9–10.3)
Chloride: 110 mmol/L (ref 98–111)
Creatinine, Ser: 2.29 mg/dL — ABNORMAL HIGH (ref 0.61–1.24)
GFR, Estimated: 31 mL/min — ABNORMAL LOW (ref >60)
Glucose, Bld: 142 mg/dL — ABNORMAL HIGH (ref 70–99)
Potassium: 4.6 mmol/L (ref 3.5–5.1)
Sodium: 142 mmol/L (ref 135–145)
Total Bilirubin: 0.4 mg/dL (ref <1.2)
Total Protein: 7.2 g/dL (ref 6.5–8.1)

## 2023-06-14 LAB — CBC
HCT: 43.8 % (ref 39.0–52.0)
Hemoglobin: 14.6 g/dL (ref 13.0–17.0)
MCH: 28.6 pg (ref 26.0–34.0)
MCHC: 33.3 g/dL (ref 30.0–36.0)
MCV: 85.9 fL (ref 80.0–100.0)
Platelets: 229 10*3/uL (ref 150–400)
RBC: 5.1 MIL/uL (ref 4.22–5.81)
RDW: 12.3 % (ref 11.5–15.5)
WBC: 4.9 10*3/uL (ref 4.0–10.5)
nRBC: 0 % (ref 0.0–0.2)

## 2023-06-14 NOTE — Progress Notes (Signed)
ReDS Vest / Clip - 06/14/23 0900       ReDS Vest / Clip   Station Marker C    Ruler Value 30.5    ReDS Value Range Low volume    ReDS Actual Value 35

## 2023-06-14 NOTE — Patient Instructions (Addendum)
Thank you for coming in today  If you had labs drawn today, any labs that are abnormal the clinic will call you No news is good news  Please follow up with your primary care provider regarding urology referral   Medications: No changes   Follow up appointments:  Your physician recommends that you schedule a follow-up appointment in:  4 months with Dr. Earlean Shawl will receive a reminder letter in the mail a few months in advance. If you don't receive a letter, please call our office to schedule the follow-up appointment.    Do the following things EVERYDAY: Weigh yourself in the morning before breakfast. Write it down and keep it in a log. Take your medicines as prescribed Eat low salt foods--Limit salt (sodium) to 2000 mg per day.  Stay as active as you can everyday Limit all fluids for the day to less than 2 liters   At the [facility], you and your health needs are our priority. As part of our continuing mission to provide you with exceptional heart care, we have created designated Provider Care Teams. These Care Teams include your primary Cardiologist (physician) and Advanced Practice Providers (APPs- Physician Assistants and Nurse Practitioners) who all work together to provide you with the care you need, when you need it.   You may see any of the following providers on your designated Care Team at your next follow up: Dr Arvilla Meres Dr Marca Ancona Dr. Marcos Eke, NP Robbie Lis, [place] [facility] [place], [place] Brynda Peon, NP Karle Plumber, PharmD   Please be sure to bring in all your medications bottles to every appointment.    Thank you for choosing [facility]  If you have any questions or concerns before your next appointment please send [place] a message through [place] or call our office at [phone].    TO LEAVE A MESSAGE FOR THE NURSE SELECT OPTION 2, PLEASE LEAVE A  MESSAGE INCLUDING: YOUR NAME DATE OF BIRTH CALL BACK NUMBER REASON FOR CALL**this is important as we prioritize the call backs  YOU WILL RECEIVE A CALL BACK THE SAME DAY AS LONG AS YOU CALL BEFORE 4:00 PM

## 2023-06-16 ENCOUNTER — Telehealth (HOSPITAL_COMMUNITY): Payer: Self-pay

## 2023-06-16 DIAGNOSIS — I5022 Chronic systolic (congestive) heart failure: Secondary | ICD-10-CM

## 2023-06-16 NOTE — Telephone Encounter (Addendum)
  Pt aware, agreeable, and verbalized understanding  Labs scheduled  ----- Message from Jacklynn Ganong sent at 06/14/2023 11:35 AM EST ----- Labs stable but renal function up a bit. No med changes for now.  Repeat BMET in 2 weeks to follow

## 2023-06-29 ENCOUNTER — Ambulatory Visit (HOSPITAL_COMMUNITY)
Admission: RE | Admit: 2023-06-29 | Discharge: 2023-06-29 | Disposition: A | Discharge status: Home / Self Care | Payer: Medicaid Other | Source: Ambulatory Visit | Attending: Cardiology | Admitting: Cardiology

## 2023-06-29 DIAGNOSIS — I5022 Chronic systolic (congestive) heart failure: Secondary | ICD-10-CM | POA: Diagnosis present

## 2023-06-29 LAB — BASIC METABOLIC PANEL
Anion gap: 11 (ref 5–15)
BUN: 23 mg/dL (ref 8–23)
CO2: 26 mmol/L (ref 22–32)
Calcium: 9.4 mg/dL (ref 8.9–10.3)
Chloride: 108 mmol/L (ref 98–111)
Creatinine, Ser: 2.72 mg/dL — ABNORMAL HIGH (ref 0.61–1.24)
GFR, Estimated: 25 mL/min — ABNORMAL LOW (ref >60)
Glucose, Bld: 135 mg/dL — ABNORMAL HIGH (ref 70–99)
Potassium: 4.3 mmol/L (ref 3.5–5.1)
Sodium: 145 mmol/L (ref 135–145)

## 2023-07-05 ENCOUNTER — Telehealth (HOSPITAL_COMMUNITY): Payer: Self-pay | Admitting: Cardiology

## 2023-07-05 DIAGNOSIS — I5022 Chronic systolic (congestive) heart failure: Secondary | ICD-10-CM

## 2023-07-05 NOTE — Telephone Encounter (Signed)
Patient called.  Patient aware. Labs 12/23

## 2023-07-05 NOTE — Telephone Encounter (Signed)
-----   Message from Jacklynn Ganong sent at 06/29/2023 10:31 AM EST ----- Renal function worse.  Hold Entresto, spiro, jardiance and torsesmide x 3 days, then may resume at previous home doses.  Repeat BMET in 5-7 days

## 2023-07-12 ENCOUNTER — Ambulatory Visit (HOSPITAL_COMMUNITY)
Admission: RE | Admit: 2023-07-12 | Discharge: 2023-07-12 | Disposition: A | Discharge status: Home / Self Care | Payer: Medicaid Other | Source: Ambulatory Visit | Attending: Cardiology | Admitting: Cardiology

## 2023-07-12 DIAGNOSIS — I5022 Chronic systolic (congestive) heart failure: Secondary | ICD-10-CM | POA: Insufficient documentation

## 2023-07-12 LAB — BASIC METABOLIC PANEL
Anion gap: 11 (ref 5–15)
BUN: 21 mg/dL (ref 8–23)
CO2: 21 mmol/L — ABNORMAL LOW (ref 22–32)
Calcium: 8.7 mg/dL — ABNORMAL LOW (ref 8.9–10.3)
Chloride: 107 mmol/L (ref 98–111)
Creatinine, Ser: 1.89 mg/dL — ABNORMAL HIGH (ref 0.61–1.24)
GFR, Estimated: 39 mL/min — ABNORMAL LOW (ref >60)
Glucose, Bld: 104 mg/dL — ABNORMAL HIGH (ref 70–99)
Potassium: 3.7 mmol/L (ref 3.5–5.1)
Sodium: 139 mmol/L (ref 135–145)

## 2023-08-03 ENCOUNTER — Other Ambulatory Visit (HOSPITAL_BASED_OUTPATIENT_CLINIC_OR_DEPARTMENT_OTHER): Payer: Self-pay

## 2023-08-03 ENCOUNTER — Other Ambulatory Visit: Payer: Self-pay | Admitting: Internal Medicine

## 2023-08-04 ENCOUNTER — Other Ambulatory Visit (HOSPITAL_BASED_OUTPATIENT_CLINIC_OR_DEPARTMENT_OTHER): Payer: Self-pay

## 2023-08-04 MED ORDER — ENTRESTO 49-51 MG PO TABS
1.0000 | ORAL_TABLET | Freq: Two times a day (BID) | ORAL | 2 refills | Status: DC
  Filled 2023-08-04 – 2023-10-30 (×2): qty 60, 30d supply, fill #0
  Filled 2023-12-16: qty 60, 30d supply, fill #1
  Filled 2024-01-13: qty 60, 30d supply, fill #2

## 2023-08-16 ENCOUNTER — Other Ambulatory Visit (HOSPITAL_BASED_OUTPATIENT_CLINIC_OR_DEPARTMENT_OTHER): Payer: Self-pay

## 2023-09-04 ENCOUNTER — Other Ambulatory Visit (HOSPITAL_BASED_OUTPATIENT_CLINIC_OR_DEPARTMENT_OTHER): Payer: Self-pay

## 2023-09-09 ENCOUNTER — Other Ambulatory Visit (HOSPITAL_BASED_OUTPATIENT_CLINIC_OR_DEPARTMENT_OTHER): Payer: Self-pay

## 2023-10-01 ENCOUNTER — Telehealth (HOSPITAL_COMMUNITY): Payer: Self-pay

## 2023-10-01 NOTE — Telephone Encounter (Signed)
 Patient stated he has been taking his torsemide medication without any missing doses however, has some increased swelling in both of his feet symptoms for 1 week. Patient is currently taking his torsemide 20 mg daily. Patient's home weight today was 195. Please advise

## 2023-10-07 ENCOUNTER — Other Ambulatory Visit (HOSPITAL_BASED_OUTPATIENT_CLINIC_OR_DEPARTMENT_OTHER): Payer: Self-pay

## 2023-10-26 ENCOUNTER — Ambulatory Visit (HOSPITAL_COMMUNITY)
Admission: RE | Admit: 2023-10-26 | Discharge: 2023-10-26 | Disposition: A | Discharge status: Home / Self Care | Source: Ambulatory Visit | Attending: Family Medicine | Admitting: Family Medicine

## 2023-10-26 ENCOUNTER — Other Ambulatory Visit (HOSPITAL_BASED_OUTPATIENT_CLINIC_OR_DEPARTMENT_OTHER): Payer: Self-pay

## 2023-10-26 ENCOUNTER — Encounter (HOSPITAL_COMMUNITY): Payer: Self-pay

## 2023-10-26 VITALS — BP 134/88 | HR 57 | Wt 184.0 lb

## 2023-10-26 DIAGNOSIS — N1832 Chronic kidney disease, stage 3b: Secondary | ICD-10-CM | POA: Diagnosis not present

## 2023-10-26 DIAGNOSIS — E785 Hyperlipidemia, unspecified: Secondary | ICD-10-CM | POA: Insufficient documentation

## 2023-10-26 DIAGNOSIS — I2510 Atherosclerotic heart disease of native coronary artery without angina pectoris: Secondary | ICD-10-CM | POA: Insufficient documentation

## 2023-10-26 DIAGNOSIS — Z7901 Long term (current) use of anticoagulants: Secondary | ICD-10-CM | POA: Insufficient documentation

## 2023-10-26 DIAGNOSIS — E1122 Type 2 diabetes mellitus with diabetic chronic kidney disease: Secondary | ICD-10-CM | POA: Insufficient documentation

## 2023-10-26 DIAGNOSIS — N183 Chronic kidney disease, stage 3 unspecified: Secondary | ICD-10-CM | POA: Insufficient documentation

## 2023-10-26 DIAGNOSIS — I130 Hypertensive heart and chronic kidney disease with heart failure and stage 1 through stage 4 chronic kidney disease, or unspecified chronic kidney disease: Secondary | ICD-10-CM | POA: Insufficient documentation

## 2023-10-26 DIAGNOSIS — I639 Cerebral infarction, unspecified: Secondary | ICD-10-CM

## 2023-10-26 DIAGNOSIS — I5022 Chronic systolic (congestive) heart failure: Secondary | ICD-10-CM | POA: Diagnosis not present

## 2023-10-26 DIAGNOSIS — I480 Paroxysmal atrial fibrillation: Secondary | ICD-10-CM

## 2023-10-26 DIAGNOSIS — I10 Essential (primary) hypertension: Secondary | ICD-10-CM

## 2023-10-26 DIAGNOSIS — I4891 Unspecified atrial fibrillation: Secondary | ICD-10-CM | POA: Diagnosis not present

## 2023-10-26 DIAGNOSIS — I110 Hypertensive heart disease with heart failure: Secondary | ICD-10-CM | POA: Diagnosis present

## 2023-10-26 DIAGNOSIS — Z7984 Long term (current) use of oral hypoglycemic drugs: Secondary | ICD-10-CM | POA: Insufficient documentation

## 2023-10-26 DIAGNOSIS — Z794 Long term (current) use of insulin: Secondary | ICD-10-CM | POA: Diagnosis not present

## 2023-10-26 DIAGNOSIS — Z87891 Personal history of nicotine dependence: Secondary | ICD-10-CM | POA: Diagnosis not present

## 2023-10-26 LAB — BASIC METABOLIC PANEL WITH GFR
Anion gap: 9 (ref 5–15)
BUN: 29 mg/dL — ABNORMAL HIGH (ref 8–23)
CO2: 26 mmol/L (ref 22–32)
Calcium: 9 mg/dL (ref 8.9–10.3)
Chloride: 105 mmol/L (ref 98–111)
Creatinine, Ser: 2.41 mg/dL — ABNORMAL HIGH (ref 0.61–1.24)
GFR, Estimated: 29 mL/min — ABNORMAL LOW (ref >60)
Glucose, Bld: 97 mg/dL (ref 70–99)
Potassium: 3.9 mmol/L (ref 3.5–5.1)
Sodium: 140 mmol/L (ref 135–145)

## 2023-10-26 LAB — BRAIN NATRIURETIC PEPTIDE: B Natriuretic Peptide: 433.8 pg/mL — ABNORMAL HIGH (ref 0.0–100.0)

## 2023-10-26 MED ORDER — TORSEMIDE 20 MG PO TABS
20.0000 mg | ORAL_TABLET | Freq: Every day | ORAL | 5 refills | Status: DC
  Filled 2023-10-26 – 2023-11-10 (×2): qty 30, 30d supply, fill #0
  Filled 2023-12-16: qty 30, 30d supply, fill #1
  Filled 2024-01-13: qty 30, 30d supply, fill #2
  Filled 2024-03-13: qty 30, 30d supply, fill #3
  Filled 2024-05-01: qty 30, 30d supply, fill #0
  Filled 2024-06-19: qty 30, 30d supply, fill #1

## 2023-10-26 MED ORDER — POTASSIUM CHLORIDE CRYS ER 20 MEQ PO TBCR
20.0000 meq | EXTENDED_RELEASE_TABLET | Freq: Every day | ORAL | 5 refills | Status: DC
  Filled 2023-10-26: qty 30, 30d supply, fill #0

## 2023-10-26 NOTE — Progress Notes (Addendum)
 ADVANCED HF CLINIC NOTE   Primary Care: Gwenevere Abbot, MD HF Cardiologist: Dr. Shirlee Latch  HPI: Rodney Schultz is a 63 y.o. AAM w/ h/o HTN, T2DM and HLD, admitted to [facility] w/ new systolic heart failure in 11/22.    He reports long history of poorly controlled HTN in the past. Baseline SBPs would average in the 170s. Denies ETOH use. Former smoker but has quit. No known family h/o CHF. No h/o ischemic-like CP. No known h/o COVID but reports having the flu prior to his 11/22 admission.     Admitted 11/22 w/ new CHF, hypertensive + AKI w/ SCr in the 2.0 range (previous baseline ~1.5). Echo w/ severe biventricular dysfunction, LVEF 20-25% with diffuse hypokinesis, mod LVH, GIIIDD (restrictive), RV severely reduced, moderate MR. R/LHC was deferred initially due to renal insufficiency. He was diuresed w/ IV Lasix. GDMT w/ hydralazine, Imdur and Coreg initiated. Discharge wt 177 lb. Referred to TOC.    Seen 1/23 in HF clinic to establish care for his CHF. R/LHC showed mildly elevated PCWP, pulmonary venous hypertension, preserved CO and diffuse CAD w/ occluded large ramus and moderate D1. 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 No good PCI options and he does not have good targets for CABG (would be high risk CABG with low EF).  Medically managed in setting of no anginal chest pain.   Echo in 4/23 showed EF 25-30% with moderate LVH and normal RV, mild MR. Echo 8/23 showed EF 35% with diffuse hypokinesis and akinesis of the mid inferolateral and mid anterolateral walls, normal RV.   Cardiac MRI in 11/23 showed LV EF 42% with RV EF 52%, there was >50% wall thickness subendocardial LGE in the mid-apical inferolateral and anterolateral walls suggestive of prior MI.   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  Carotid dopplers in 2/24 showed only mild stenosis.  He also had AKI on CKD stage  3.  Supposedly he had atrial fibrillation by ECG but the ECGs in the system all show NSR (though one is labeled atrial fibrillation).  He was started on apixaban.  Imdur, spironolactone, and Coreg were stopped.  Echo in 2/24 showed EF up to 55-60% with mild LVH and normal RV.   Unfortunately, despite a nebulous diagnosis of atrial fibrillation and multiple cardiac med adjustments, cardiology was not consulted during this admission.    Echo 8/24 EF 40-45%, mildly reduced RV function  Today he returns for HF follow up. Overall feeling fine. He is not SOB walking up steps or inclines. Push mowed his yard without dyspnea this week. Feet swell occasionally at night. Denies palpitations, abnormal bleeding, CP, dizziness, or PND/Orthopnea. Appetite ok. No fever or chills. Weight at home 180 pounds. Taking all medications.  Wife died Nov 08, 2022. Quit smoking x 4 months.  ReDs reading: 39%, abnormal  ECG (personally reviewed):  SB 55 bpm  Labs (2/24): K 3.6, creatinine 1.88, TGs 514 Labs (7/24): K 4.5, creatinine 1.92, LDL 65, TGs 209 Labs (8/24): K 4.4, creatinine 2.01 Labs (9/24): LDL 41 Labs (12/24): K 3.7, creatinine 1.89  PMH: 1. Type 2 diabetes 2. HTN: Poorly controlled.  - Renal artery dopplers: Mild renal artery stenosis.   3. GERD 4. CKD stage 3 5. Hyperlipidemia 6. Former smoker 7. CAD: LHC (1/23) with occluded large ramu, occluded moderate D2, 90% dLAD, 80% dLCx, diffuse RCA disease reaching 80% in the mid  vessel and 80% in the distal vessel.  No good targets for CABG, and reviewed by interventional cardiology who thought no good interventional targets given absence of anginal pain.  8. 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  - Echo (11/22): EF 20-25%, severe LV dysfunction with global HK, moderate LVH, grade III DD (restrictive), RV severely reduced, moderate MR  - RHC (1/23): mean RA 8, PA 54/17 mean 32, mean PCWP 18, CI 2.54, PVR 2.8 WU (pulmonary  venous hypertension).  - Echo (4/23): EF 25-30% with moderate LVH and normal RV, mild MR. - Echo (8/23): EF 35% with diffuse hypokinesis and akinesis of the mid inferolateral and mid anterolateral walls, normal RV.  - Cardiac MRI (11/23): LV EF 42% with RV EF 52%, there was >50% wall thickness subendocardial LGE in the mid-apical inferolateral and anterolateral walls suggestive of prior MI.  - Echo (2/24): EF 55-60%, mild LVH, normal RV.  - Echo (8/24): EF 40-45%, RV mildly reduced. 9. OSA: Mild on 2/23 sleep study.  10. Left renal mass: Likely cyst by renal US.  11. CVA: 1/24, ?related to atrial fibrillation. 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 - Carotid dopplers (2/24) with mild disease.  12. Atrial fibrillation: Paroxysmal.  Somewhat questionable diagnosis at time of CVA in 1/24.   Current Outpatient Medications  Medication Sig Dispense Refill   apixaban (ELIQUIS) 5 MG TABS tablet Take 1 tablet (5 mg total) by mouth 2 (two) times daily. 60 tablet 2   atorvastatin (LIPITOR) 80 MG tablet Take 1 tablet (80 mg total) by mouth daily. 30 tablet 2   Blood Glucose Monitoring Suppl (ACCU-CHEK GUIDE) w/Device KIT Use as directed twice daily. 1 kit 0   carvedilol (COREG) 6.25 MG tablet Take 1 tablet (6.25 mg total) by mouth 2 (two) times daily. 180 tablet 3   ezetimibe (ZETIA) 10 MG tablet Take 1 tablet (10 mg total) by mouth daily. 90 tablet 3   glucose blood test strip Use three times daily to check blood sugars 100 each 12   hydrALAZINE (APRESOLINE) 50 MG tablet Take 1 tablet (50 mg total) by mouth 3 (three) times daily. 90 tablet 3   icosapent Ethyl (VASCEPA) 1 g capsule Take 2 capsules (2 g total) by mouth 2 (two) times daily. 120 capsule 6   insulin glargine (LANTUS) 100 UNIT/ML Solostar Pen Inject 8 Units into the skin daily. Discard pen 28 days after first use 3 mL 11   Insulin Pen Needle (UNIFINE PENTIPS) 32G X 4 MM MISC Use daily with insulin 100 each 2   isosorbide mononitrate  (IMDUR) 30 MG 24 hr tablet Take 1 tablet (30 mg total) by mouth daily. 90 tablet 3   Lancets Micro Thin 33G MISC Use to check blood sugar twice daily 100 each 1   levETIRAcetam (KEPPRA) 250 MG tablet Take 1 tablet (250 mg total) by mouth 2 (two) times daily. 60 tablet 2   metFORMIN (GLUCOPHAGE-XR) 500 MG 24 hr tablet Take 1 tablet (500 mg total) by mouth 2 (two) times daily. 60 tablet 2   sacubitril-valsartan (ENTRESTO) 49-51 MG Take 1 tablet by mouth 2 (two) times daily. 60 tablet 2   spironolactone (ALDACTONE) 25 MG tablet Take 1 tablet (25 mg total) by mouth daily. 90 tablet 3   torsemide (DEMADEX) 20 MG tablet Take 1 tablet (20 mg total) by mouth daily as needed for extra fluid. (Patient taking differently: Take 20 mg by mouth daily as needed (extra fluid). Patient takes  1 tablet by mouth every other day.) 30 tablet 2   No current facility-administered medications for this encounter.   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, [employer] representative "yard dog"  Tobacco Use   Smoking status: Some Days    Current packs/day: 0.10    Average packs/day: 0.1 packs/day for 35.7 years (3.6 ttl pk-yrs)    Types: Cigarettes    Start date: 09/02/2021   Smokeless tobacco: Never   Tobacco comments:    "Once in a blue moon"      Vaping Use   Vaping status: Never Used  Substance and Sexual Activity   Alcohol use: Not Currently   Drug use: Never   Sexual activity: Not on file  Other Topics Concern   Not on file  Social History Narrative   Not on file   Social Drivers of Health   Financial Resource Strain: Low Risk  (01/20/2023)   Overall Financial Resource Strain (CARDIA)    Difficulty  of Paying Living Expenses: Not hard at all  Food Insecurity: No Food Insecurity (01/20/2023)   Hunger Vital Sign    Worried About Running Out of Food in the Last Year: Never true    Ran Out of Food in the Last Year: Never true  Transportation Needs: No Transportation Needs (01/20/2023)   PRAPARE - Administrator, Civil Service (Medical): No    Lack of Transportation (Non-Medical): No  Physical Activity: Sufficiently Active (01/20/2023)   Exercise Vital Sign    Days of Exercise per Week: 7 days    Minutes of Exercise per Session: 90 min  Stress: No Stress Concern Present (01/20/2023)   [employer] of Occupational Health - Occupational Stress Questionnaire    Feeling of Stress : Not at all  Social Connections: Moderately Isolated (01/20/2023)   Social Connection and Isolation Panel [NHANES]    Frequency of Communication with Friends and Family: Once a week    Frequency of Social Gatherings with Friends and Family: Twice a week    Attends Religious Services: More than 4 times per year    Active Member of [employer] or Organizations: No    Attends Banker Meetings: Never    Marital Status: Widowed  Intimate Partner Violence: Not At Risk (01/20/2023)   Humiliation, Afraid, Rape, and Kick questionnaire    Fear of Current or Ex-Partner: No    Emotionally Abused: No    Physically Abused: No    Sexually Abused: No   Family History  Problem Relation Age of Onset   Healthy Mother    BP 134/88   Pulse (!) 57   Wt 83.5 kg (184 lb)   SpO2 100%   BMI 26.40 kg/m   Wt Readings from Last 3 Encounters:  10/26/23 83.5 kg (184 lb)  06/14/23 86.5 kg (190 lb 9.6 oz)  06/01/23 86.2 kg (190 lb)   PHYSICAL EXAM: General:  NAD. No resp difficulty, walked into clinic HEENT: Normal Neck: Supple. JVP 8-10 Cor: Regular rate & rhythm. No rubs, gallops or murmurs. Lungs: Clear Abdomen: Soft, nontender, nondistended.  Extremities: No cyanosis, clubbing,  rash, edema Neuro: Alert &  oriented x 3, moves all 4 extremities w/o difficulty. Affect pleasant.  ASSESSMENT & PLAN: 1. HTN: Long history.  Renal artery dopplers in 5/23 with mild renal artery stenosis.  - BP now controlled.  2. 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 Echo (11/22) w/ severe biventricular dysfunction, LVEF 20-25%, Mod LVH, GIIIDD (restrictive), RV severely reduced, diffuse HK. Underwent R/LHC (1/23) showing significant CAD but no good targets for CABG.  Echo in 4/23 showed EF persistently low at 25-30%. Echo 8/23 showed EF 35% with diffuse hypokinesis and akinesis of the mid inferolateral and mid anterolateral walls, normal RV.  Cardiac MRI in 11/23 showed LV EF 42% with RV EF 52%, there was >50% wall thickness subendocardial LGE in the mid-apical inferolateral and anterolateral walls suggestive of prior MI.  Interestingly, echo in 2/24 during admission for CVA showed LV EF up to 55-60% with normal RV.  Echo 8/24 showed EF 40-45%. NYHA class I-II, mildly volume up on exam and ReDs 39%. - Increase torsemide to 20 mg daily, add 20 KCL daily. BMET/BNP today, repeat BMET in 10-14 days.  - Continue Jardiance 25 mg daily. No GU symptoms. - Continue Coreg 6.25 mg bid.  - Continue Entresto 49/51 bid.  - Continue spironolactone 25 mg daily.  - Continue hydralazine 50 mg tid (cannot tolerate higher dose) and Imdur 30 mg daily.  - Update echo next appt. 3. CAD: LHC (1/23) with diffuse disease occluded large ramus and moderate D1. 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 No good PCI options in setting of diffuse disease and no chest pain, and he does not have good targets for CABG (would be high risk CABG with low EF). Continue medical management. No chest pain. - No ASA given Eliquis use.  - Continue atorvastatin.  4. CKD, Stage III: BMET today.  5. Hyperlipidemia: Continue atorvastatin + Zetia.  Good lipids 9/24. - Last LDL 41. LDL goal < 55.   6. Atrial  fibrillation: CVA in 2/24 and apparently atrial fibrillation was seen in the hospital.  All ECGs in system show NSR, one is labeled AF but is NSR.  I am not totally sure of this diagnosis, but given recent CVA, will keep him on Eliquis.  - Continue Eliquis. No bleeding issues.  7. CVA: In 2/24, no residual neurological symptoms/signs.  ?AF related.  Carotid dopplers in 2/24 showed no significant stenosis.  - He will continue Eliquis.  8. Tobacco: quit cigarettes x 4 months. Congratulated.  Follow up in 4 months with Dr. Shirlee Latch + echo.  [facility] FNP-BC 10/26/2023

## 2023-10-26 NOTE — Addendum Note (Signed)
 Encounter addended by: Jacklynn Ganong, FNP on: 10/26/2023 1:07 PM  Actions taken: Clinical Note Signed

## 2023-10-26 NOTE — Progress Notes (Signed)
 ReDS Vest / Clip - 10/26/23 1200       ReDS Vest / Clip   Station Marker C    Ruler Value 28.5    ReDS Value Range Moderate volume overload    ReDS Actual Value 39

## 2023-10-26 NOTE — Patient Instructions (Addendum)
 Medication Changes:  INCREASE TORSEMIDE TO 20MG  ONCE DAILY   START POTASSIUM ONCE DAILY   Lab Work:  Labs done today, your results will be available in MyChart, we will contact you for abnormal readings.  THEN RETURN AS SCHEDULED IN 10-14 DAYS   Follow-Up in: WITH DR. Shirlee Latch WITH AN ECHO PLEASE CALL OUR OFFICE AROUND JUNE TO GET SCHEDULED FOR YOUR APPOINTMENT. PHONE NUMBER IS [phone] OPTION 2    At the [facility], you and your health needs are our priority. We have a designated team specialized in the treatment of Heart Failure. This Care Team includes your primary Heart Failure Specialized Cardiologist (physician), Advanced Practice Providers (APPs- Physician Assistants and Nurse Practitioners), and Pharmacist who all work together to provide you with the care you need, when you need it.   You may see any of the following providers on your designated Care Team at your next follow up:  Dr. Arvilla Meres Dr. Marca Ancona Dr. Dorthula Nettles Dr. Theresia Bough Tonye Becket, NP Robbie Lis, [place] [facility] [place], [place] Brynda Peon, NP [place] Lee, NP Karle Plumber, PharmD   Please be sure to bring in all your medications bottles to every appointment.   Need to Contact us:  If you have any questions or concerns before your next appointment please send [place] a message through [place] or call our office at [phone].    TO LEAVE A MESSAGE FOR THE NURSE SELECT OPTION 2, PLEASE LEAVE A MESSAGE INCLUDING: YOUR NAME DATE OF BIRTH CALL BACK NUMBER REASON FOR CALL**this is important as we prioritize the call backs  YOU WILL RECEIVE A CALL BACK THE SAME DAY AS LONG AS YOU CALL BEFORE 4:00 PM

## 2023-10-30 ENCOUNTER — Other Ambulatory Visit (HOSPITAL_BASED_OUTPATIENT_CLINIC_OR_DEPARTMENT_OTHER): Payer: Self-pay

## 2023-11-09 ENCOUNTER — Ambulatory Visit (HOSPITAL_COMMUNITY)
Admission: RE | Admit: 2023-11-09 | Discharge: 2023-11-09 | Disposition: A | Discharge status: Home / Self Care | Source: Ambulatory Visit | Attending: Cardiology | Admitting: Cardiology

## 2023-11-09 DIAGNOSIS — I5022 Chronic systolic (congestive) heart failure: Secondary | ICD-10-CM | POA: Diagnosis present

## 2023-11-09 LAB — BASIC METABOLIC PANEL WITH GFR
Anion gap: 10 (ref 5–15)
BUN: 23 mg/dL (ref 8–23)
CO2: 24 mmol/L (ref 22–32)
Calcium: 8.7 mg/dL — ABNORMAL LOW (ref 8.9–10.3)
Chloride: 109 mmol/L (ref 98–111)
Creatinine, Ser: 2.28 mg/dL — ABNORMAL HIGH (ref 0.61–1.24)
GFR, Estimated: 31 mL/min — ABNORMAL LOW (ref >60)
Glucose, Bld: 133 mg/dL — ABNORMAL HIGH (ref 70–99)
Potassium: 4.1 mmol/L (ref 3.5–5.1)
Sodium: 143 mmol/L (ref 135–145)

## 2023-11-10 ENCOUNTER — Other Ambulatory Visit (HOSPITAL_COMMUNITY): Payer: Self-pay

## 2023-11-10 ENCOUNTER — Other Ambulatory Visit (HOSPITAL_COMMUNITY): Payer: Self-pay | Admitting: Cardiology

## 2023-11-10 ENCOUNTER — Other Ambulatory Visit (HOSPITAL_BASED_OUTPATIENT_CLINIC_OR_DEPARTMENT_OTHER): Payer: Self-pay

## 2023-11-10 MED ORDER — CARVEDILOL 6.25 MG PO TABS
6.2500 mg | ORAL_TABLET | Freq: Two times a day (BID) | ORAL | 3 refills | Status: AC
  Filled 2023-11-10: qty 180, 90d supply, fill #0
  Filled 2023-11-11: qty 60, 30d supply, fill #0
  Filled 2023-11-11: qty 180, 90d supply, fill #0
  Filled 2024-02-14: qty 60, 30d supply, fill #1
  Filled 2024-05-01: qty 60, 30d supply, fill #0
  Filled 2024-07-31: qty 60, 30d supply, fill #1

## 2023-11-10 MED ORDER — ICOSAPENT ETHYL 1 G PO CAPS
2.0000 g | ORAL_CAPSULE | Freq: Two times a day (BID) | ORAL | 6 refills | Status: DC
  Filled 2023-11-10 – 2023-11-11 (×2): qty 120, 30d supply, fill #0

## 2023-11-11 ENCOUNTER — Other Ambulatory Visit (HOSPITAL_BASED_OUTPATIENT_CLINIC_OR_DEPARTMENT_OTHER): Payer: Self-pay

## 2023-11-17 ENCOUNTER — Other Ambulatory Visit: Payer: Self-pay

## 2023-11-17 ENCOUNTER — Other Ambulatory Visit (HOSPITAL_BASED_OUTPATIENT_CLINIC_OR_DEPARTMENT_OTHER): Payer: Self-pay

## 2023-11-17 ENCOUNTER — Other Ambulatory Visit (HOSPITAL_COMMUNITY): Payer: Self-pay

## 2023-11-17 DIAGNOSIS — E1165 Type 2 diabetes mellitus with hyperglycemia: Secondary | ICD-10-CM

## 2023-11-17 MED ORDER — ACCU-CHEK GUIDE TEST VI STRP
ORAL_STRIP | 12 refills | Status: AC
  Filled 2023-11-17: qty 100, 30d supply, fill #0
  Filled 2024-06-19: qty 100, 33d supply, fill #1

## 2023-12-16 ENCOUNTER — Other Ambulatory Visit (HOSPITAL_BASED_OUTPATIENT_CLINIC_OR_DEPARTMENT_OTHER): Payer: Self-pay

## 2023-12-16 ENCOUNTER — Other Ambulatory Visit: Payer: Self-pay | Admitting: Internal Medicine

## 2023-12-16 DIAGNOSIS — I63512 Cerebral infarction due to unspecified occlusion or stenosis of left middle cerebral artery: Secondary | ICD-10-CM

## 2023-12-16 MED ORDER — ATORVASTATIN CALCIUM 80 MG PO TABS
80.0000 mg | ORAL_TABLET | Freq: Every day | ORAL | 2 refills | Status: DC
Start: 2023-12-16 — End: 2024-05-01
  Filled 2023-12-16: qty 30, 30d supply, fill #0
  Filled 2024-01-13: qty 30, 30d supply, fill #1
  Filled 2024-02-12: qty 30, 30d supply, fill #2

## 2024-01-04 ENCOUNTER — Encounter: Payer: Self-pay | Admitting: *Deleted

## 2024-01-13 ENCOUNTER — Other Ambulatory Visit: Payer: Self-pay

## 2024-01-13 ENCOUNTER — Other Ambulatory Visit: Payer: Self-pay | Admitting: Internal Medicine

## 2024-01-13 ENCOUNTER — Other Ambulatory Visit (HOSPITAL_BASED_OUTPATIENT_CLINIC_OR_DEPARTMENT_OTHER): Payer: Self-pay

## 2024-01-13 ENCOUNTER — Other Ambulatory Visit: Payer: Self-pay | Admitting: Student

## 2024-01-13 DIAGNOSIS — E1165 Type 2 diabetes mellitus with hyperglycemia: Secondary | ICD-10-CM

## 2024-01-13 MED ORDER — EMPAGLIFLOZIN 25 MG PO TABS
25.0000 mg | ORAL_TABLET | Freq: Every day | ORAL | 2 refills | Status: DC
  Filled 2024-01-13 – 2024-04-10 (×2): qty 30, 30d supply, fill #0

## 2024-01-13 MED ORDER — LANTUS SOLOSTAR 100 UNIT/ML ~~LOC~~ SOPN
15.0000 [IU] | PEN_INJECTOR | Freq: Every day | SUBCUTANEOUS | 11 refills | Status: DC
  Filled 2024-01-13 – 2024-01-14 (×2): qty 3, 20d supply, fill #0
  Filled 2024-01-29: qty 3, 20d supply, fill #1
  Filled 2024-02-12 – 2024-02-14 (×2): qty 3, 20d supply, fill #2
  Filled 2024-02-25 – 2024-02-29 (×2): qty 3, 20d supply, fill #3
  Filled 2024-03-13 – 2024-03-15 (×2): qty 3, 20d supply, fill #4

## 2024-01-13 MED ORDER — METFORMIN HCL ER 500 MG PO TB24
500.0000 mg | ORAL_TABLET | Freq: Two times a day (BID) | ORAL | 2 refills | Status: DC
  Filled 2024-01-13: qty 60, 30d supply, fill #0

## 2024-01-13 NOTE — Telephone Encounter (Signed)
 Medication sent to pharmacy

## 2024-01-14 ENCOUNTER — Other Ambulatory Visit (HOSPITAL_BASED_OUTPATIENT_CLINIC_OR_DEPARTMENT_OTHER): Payer: Self-pay

## 2024-01-14 ENCOUNTER — Other Ambulatory Visit: Payer: Self-pay

## 2024-01-20 ENCOUNTER — Other Ambulatory Visit (HOSPITAL_BASED_OUTPATIENT_CLINIC_OR_DEPARTMENT_OTHER): Payer: Self-pay

## 2024-01-20 ENCOUNTER — Ambulatory Visit: Admitting: Student

## 2024-01-20 ENCOUNTER — Other Ambulatory Visit: Payer: Self-pay

## 2024-01-20 VITALS — BP 145/92 | HR 60 | Temp 98.1°F | Ht 70.0 in | Wt 198.6 lb

## 2024-01-20 DIAGNOSIS — Z794 Long term (current) use of insulin: Secondary | ICD-10-CM

## 2024-01-20 DIAGNOSIS — E118 Type 2 diabetes mellitus with unspecified complications: Secondary | ICD-10-CM

## 2024-01-20 DIAGNOSIS — I63512 Cerebral infarction due to unspecified occlusion or stenosis of left middle cerebral artery: Secondary | ICD-10-CM | POA: Diagnosis not present

## 2024-01-20 DIAGNOSIS — I5022 Chronic systolic (congestive) heart failure: Secondary | ICD-10-CM

## 2024-01-20 DIAGNOSIS — I110 Hypertensive heart disease with heart failure: Secondary | ICD-10-CM | POA: Diagnosis not present

## 2024-01-20 DIAGNOSIS — Z7985 Long-term (current) use of injectable non-insulin antidiabetic drugs: Secondary | ICD-10-CM

## 2024-01-20 DIAGNOSIS — I10 Essential (primary) hypertension: Secondary | ICD-10-CM

## 2024-01-20 DIAGNOSIS — Z7984 Long term (current) use of oral hypoglycemic drugs: Secondary | ICD-10-CM

## 2024-01-20 DIAGNOSIS — I4891 Unspecified atrial fibrillation: Secondary | ICD-10-CM

## 2024-01-20 LAB — POCT GLYCOSYLATED HEMOGLOBIN (HGB A1C): Hemoglobin A1C: 7 % — AB (ref 4.0–5.6)

## 2024-01-20 LAB — GLUCOSE, CAPILLARY: Glucose-Capillary: 150 mg/dL — ABNORMAL HIGH (ref 70–99)

## 2024-01-20 MED ORDER — SPIRONOLACTONE 25 MG PO TABS
25.0000 mg | ORAL_TABLET | Freq: Every day | ORAL | 3 refills | Status: AC
  Filled 2024-01-20 – 2024-05-01 (×2): qty 90, 90d supply, fill #0
  Filled 2024-07-31: qty 30, 30d supply, fill #1

## 2024-01-20 MED ORDER — APIXABAN 5 MG PO TABS
5.0000 mg | ORAL_TABLET | Freq: Two times a day (BID) | ORAL | 2 refills | Status: DC
  Filled 2024-01-20 – 2024-05-01 (×2): qty 60, 30d supply, fill #0

## 2024-01-20 MED ORDER — FREESTYLE LIBRE 3 SENSOR MISC
1 refills | Status: DC
  Filled 2024-01-20: qty 2, 30d supply, fill #0

## 2024-01-20 MED ORDER — SEMAGLUTIDE(0.25 OR 0.5MG/DOS) 2 MG/3ML ~~LOC~~ SOPN
0.2500 mg | PEN_INJECTOR | SUBCUTANEOUS | 3 refills | Status: DC
  Filled 2024-01-20: qty 3, 28d supply, fill #0

## 2024-01-20 NOTE — Assessment & Plan Note (Signed)
 Noted on telemetry when patient was admitted for acute CVA.  Has been on Eliquis  ever since, recently had a cardiology appointment in April who recommended continue keeping him on it.  Will refill for him.

## 2024-01-20 NOTE — Progress Notes (Signed)
 CC: Chronic Conditions follow up  HPI:  Rodney Schultz is a 63 y.o. male living with a history stated below and presents today for chronic condition follow up. Please see problem based assessment and plan for additional details.  Past Medical History:  Diagnosis Date   Diabetes mellitus without complication (HCC)    GERD (gastroesophageal reflux disease)    Hypertension     Current Outpatient Medications on File Prior to Visit  Medication Sig Dispense Refill   atorvastatin  (LIPITOR ) 80 MG tablet Take 1 tablet (80 mg total) by mouth daily. 30 tablet 2   Blood Glucose Monitoring Suppl (ACCU-CHEK GUIDE) w/Device KIT Use as directed twice daily. 1 kit 0   carvedilol  (COREG ) 6.25 MG tablet Take 1 tablet (6.25 mg total) by mouth 2 (two) times daily. 180 tablet 3   empagliflozin  (JARDIANCE ) 25 MG TABS tablet Take 1 tablet (25 mg total) by mouth daily before breakfast. 30 tablet 2   ezetimibe  (ZETIA ) 10 MG tablet Take 1 tablet (10 mg total) by mouth daily. 90 tablet 3   glucose blood (ACCU-CHEK GUIDE TEST) test strip Use three times daily to check blood sugars 100 each 12   hydrALAZINE  (APRESOLINE ) 50 MG tablet Take 1 tablet (50 mg total) by mouth 3 (three) times daily. 90 tablet 3   icosapent  Ethyl (VASCEPA ) 1 g capsule Take 2 capsules (2 g total) by mouth 2 (two) times daily. 120 capsule 6   insulin  glargine (LANTUS  SOLOSTAR) 100 UNIT/ML Solostar Pen Inject 15 Units into the skin daily. Discard pen 28 days after first use 3 mL 11   Insulin  Pen Needle (UNIFINE PENTIPS) 32G X 4 MM MISC Use daily with insulin  100 each 2   isosorbide  mononitrate (IMDUR ) 30 MG 24 hr tablet Take 1 tablet (30 mg total) by mouth daily. 90 tablet 3   Lancets Micro Thin 33G MISC Use to check blood sugar twice daily 100 each 1   levETIRAcetam  (KEPPRA ) 250 MG tablet Take 1 tablet (250 mg total) by mouth 2 (two) times daily. 60 tablet 2   metFORMIN  (GLUCOPHAGE -XR) 500 MG 24 hr tablet Take 1 tablet (500 mg total) by  mouth 2 (two) times daily. 60 tablet 2   potassium chloride  SA (KLOR-CON  M) 20 MEQ tablet Take 1 tablet (20 mEq total) by mouth daily. 30 tablet 5   sacubitril -valsartan  (ENTRESTO ) 49-51 MG Take 1 tablet by mouth 2 (two) times daily. 60 tablet 2   torsemide  (DEMADEX ) 20 MG tablet Take 1 tablet (20 mg total) by mouth daily. 30 tablet 5   [DISCONTINUED] loratadine (CLARITIN) 10 MG tablet Take 10 mg by mouth daily as needed for allergies.     No current facility-administered medications on file prior to visit.    Family History  Problem Relation Age of Onset   Healthy Mother     Social History   Socioeconomic History   Marital status: Divorced    Spouse name: Not on file   Number of children: 4   Years of education: Not on file   Highest education level: 11th grade  Occupational History   Occupation: Orthoptist yard    Comment: operates forklift, [employer] representative yard dog  Tobacco Use   Smoking status: Some Days    Current packs/day: 0.10    Average packs/day: 0.1 packs/day for 36.0 years (3.6 ttl pk-yrs)    Types: Cigarettes    Start date: 09/02/2021   Smokeless tobacco: Never   Tobacco comments:    Once in  a blue moon      Vaping Use   Vaping status: Never Used  Substance and Sexual Activity   Alcohol use: Not Currently   Drug use: Never   Sexual activity: Not on file  Other Topics Concern   Not on file  Social History Narrative   Not on file   Social Drivers of Health   Financial Resource Strain: Low Risk  (01/20/2024)   Overall Financial Resource Strain (CARDIA)    Difficulty of Paying Living Expenses: Not very hard  Food Insecurity: No Food Insecurity (01/20/2024)   Hunger Vital Sign    Worried About Running Out of Food in the Last Year: Never true    Ran Out of Food in the Last Year: Never true  Transportation Needs: No Transportation Needs (01/20/2024)   PRAPARE - Administrator, Civil Service (Medical): No    Lack of Transportation (Non-Medical): No   Physical Activity: Unknown (01/20/2024)   Exercise Vital Sign    Days of Exercise per Week: 4 days    Minutes of Exercise per Session: Not on file  Stress: No Stress Concern Present (01/20/2024)   [employer] of Occupational Health - Occupational Stress Questionnaire    Feeling of Stress: Not at all  Social Connections: Moderately Isolated (01/20/2024)   Social Connection and Isolation Panel    Frequency of Communication with Friends and Family: More than three times a week    Frequency of Social Gatherings with Friends and Family: More than three times a week    Attends Religious Services: Patient unable to answer    Active Member of Clubs or Organizations: Yes    Attends Banker Meetings: More than 4 times per year    Marital Status: Divorced  Intimate Partner Violence: Not At Risk (01/20/2024)   Humiliation, Afraid, Rape, and Kick questionnaire    Fear of Current or Ex-Partner: No    Emotionally Abused: No    Physically Abused: No    Sexually Abused: No    Review of Systems: ROS negative except for what is noted on the assessment and plan.  Vitals:   01/20/24 0959 01/20/24 1006  BP: (!) 145/98 (!) 145/92  Pulse: 60 60  Temp: 98.1 F (36.7 C)   TempSrc: Oral   SpO2: 98%   Weight: 198 lb 9.6 oz (90.1 kg)   Height: 5' 10 (1.778 m)     Physical Exam: Constitutional: well-appearing male  in no acute distress Cardiovascular: regular rate and rhythm, no m/r/g Pulmonary/Chest: normal work of breathing on room air, lungs clear to auscultation bilaterally Abdominal: soft, non-tender, non-distended  Assessment & Plan:   Atrial fibrillation (HCC) Noted on telemetry when patient was admitted for acute CVA.  Has been on Eliquis  ever since, recently had a cardiology appointment in April who recommended continue keeping him on it.  Will refill for him.  Chronic HFrEF (heart failure with reduced ejection fraction) (HCC) Patient follows with cardiology, for his heart  failure with reduced ejection fraction EF of 40 to 45%.  Will need GDMT is taking currently is Entresto  49/51 twice a day.  Per last cardiology note, patient was supposed to continue his Coreg  6.25 mg twice a day, however patient states he has not been taking it.  Will refill.  Also per last cardiology note he will need to be on spironolactone , will refill.  Type 2 diabetes with complication (HCC) Last A1c checked was 6.4, today is 7.1.  Current regimen includes Lantus   15 units (per last visit use was to be taking 8, however has just been taking 15).  He is also on Jardiance , metformin  500 mg twice a day.  I have sent in a glucose monitor for him, we will continue his Lantus  to 15 units, and we will start Ozempic.  Essential hypertension, benign Blood pressure slightly elevated today 145/98 initially, on repeat 145/92.  On the spironolactone  that he supposed be taking for his heart failure with reduced ejection fraction.   Patient discussed with Dr. Narendra  Laveda Demedeiros, M.D. [facility], PGY-3 Pager: [phone] Date 01/20/2024 Time 6:12 PM

## 2024-01-20 NOTE — Assessment & Plan Note (Signed)
 Blood pressure slightly elevated today 145/98 initially, on repeat 145/92.  On the spironolactone  that he supposed be taking for his heart failure with reduced ejection fraction.

## 2024-01-20 NOTE — Assessment & Plan Note (Signed)
 Last A1c checked was 6.4, today is 7.1.  Current regimen includes Lantus  15 units (per last visit use was to be taking 8, however has just been taking 15).  He is also on Jardiance , metformin  500 mg twice a day.  I have sent in a glucose monitor for him, we will continue his Lantus  to 15 units, and we will start Ozempic.

## 2024-01-20 NOTE — Patient Instructions (Signed)
 Thank you so much for coming to the clinic today!   I have sent in a refill of most of your medications, I'd like to see you back in a week so we can go over everything you're taking. Please bring in your medications as well so we can walk through all of them.   The number for urology is below, ask for Dr. Lovie Junker [facility] in [place], [place]  Address: [address] [place], [place], [place] [address] Phone: [phone]  If you have any questions please feel free to the call the clinic at anytime at [phone]. It was a pleasure seeing you!  Best, Dr. Jeda Pardue

## 2024-01-20 NOTE — Assessment & Plan Note (Signed)
 Patient follows with cardiology, for his heart failure with reduced ejection fraction EF of 40 to 45%.  Will need GDMT is taking currently is Entresto  49/51 twice a day.  Per last cardiology note, patient was supposed to continue his Coreg  6.25 mg twice a day, however patient states he has not been taking it.  Will refill.  Also per last cardiology note he will need to be on spironolactone , will refill.

## 2024-01-21 LAB — MICROALBUMIN / CREATININE URINE RATIO
Creatinine, Urine: 39.7 mg/dL
Microalb/Creat Ratio: 389 mg/g{creat} — ABNORMAL HIGH (ref 0–29)
Microalbumin, Urine: 154.4 ug/mL

## 2024-01-21 NOTE — Progress Notes (Signed)
 Internal Medicine Clinic Attending  Case discussed with the resident at the time of the visit.  We reviewed the resident's history and exam and pertinent patient test results.  I agree with the assessment, diagnosis, and plan of care documented in the resident's note.

## 2024-01-24 ENCOUNTER — Ambulatory Visit: Payer: Self-pay | Admitting: Student

## 2024-01-24 ENCOUNTER — Other Ambulatory Visit: Payer: Self-pay

## 2024-01-25 ENCOUNTER — Other Ambulatory Visit (HOSPITAL_BASED_OUTPATIENT_CLINIC_OR_DEPARTMENT_OTHER): Payer: Self-pay

## 2024-01-29 ENCOUNTER — Other Ambulatory Visit (HOSPITAL_BASED_OUTPATIENT_CLINIC_OR_DEPARTMENT_OTHER): Payer: Self-pay

## 2024-02-12 ENCOUNTER — Other Ambulatory Visit: Payer: Self-pay | Admitting: Internal Medicine

## 2024-02-12 ENCOUNTER — Other Ambulatory Visit (HOSPITAL_BASED_OUTPATIENT_CLINIC_OR_DEPARTMENT_OTHER): Payer: Self-pay

## 2024-02-12 ENCOUNTER — Other Ambulatory Visit (HOSPITAL_COMMUNITY): Payer: Self-pay | Admitting: Cardiology

## 2024-02-14 ENCOUNTER — Other Ambulatory Visit: Payer: Self-pay

## 2024-02-14 ENCOUNTER — Other Ambulatory Visit (HOSPITAL_BASED_OUTPATIENT_CLINIC_OR_DEPARTMENT_OTHER): Payer: Self-pay

## 2024-02-15 ENCOUNTER — Other Ambulatory Visit (HOSPITAL_BASED_OUTPATIENT_CLINIC_OR_DEPARTMENT_OTHER): Payer: Self-pay

## 2024-02-16 ENCOUNTER — Other Ambulatory Visit (HOSPITAL_BASED_OUTPATIENT_CLINIC_OR_DEPARTMENT_OTHER): Payer: Self-pay

## 2024-02-16 ENCOUNTER — Other Ambulatory Visit: Payer: Self-pay

## 2024-02-16 MED ORDER — HYDRALAZINE HCL 50 MG PO TABS
50.0000 mg | ORAL_TABLET | Freq: Three times a day (TID) | ORAL | 3 refills | Status: AC
  Filled 2024-02-16 – 2024-05-01 (×2): qty 90, 30d supply, fill #0
  Filled 2024-07-31: qty 90, 30d supply, fill #1

## 2024-02-17 ENCOUNTER — Other Ambulatory Visit (HOSPITAL_BASED_OUTPATIENT_CLINIC_OR_DEPARTMENT_OTHER): Payer: Self-pay

## 2024-02-17 MED ORDER — SACUBITRIL-VALSARTAN 49-51 MG PO TABS
1.0000 | ORAL_TABLET | Freq: Two times a day (BID) | ORAL | 2 refills | Status: DC
  Filled 2024-02-17 – 2024-05-01 (×2): qty 60, 30d supply, fill #0

## 2024-02-25 ENCOUNTER — Other Ambulatory Visit (HOSPITAL_BASED_OUTPATIENT_CLINIC_OR_DEPARTMENT_OTHER): Payer: Self-pay

## 2024-03-08 ENCOUNTER — Telehealth (HOSPITAL_COMMUNITY): Payer: Self-pay

## 2024-03-08 NOTE — Telephone Encounter (Signed)
  [facility]   Pre-operative Risk Assessment       Request for Surgical Clearance    Procedure:  Right Orchiectomy { Date of Surgery:  Clearance 04/04/24                                Surgeon:  Dr. Lovie Socks Group or Practice Name:  [facility]  Phone number:  [phone] Fax number:  [phone]  Type of Clearance Requested:   - Medical  - Pharmacy:  Hold Apixaban  (Eliquis )   { Type of Anesthesia:  not listed  {  Signed, Andriette NOVAK Previn Jian   03/08/2024, 11:29 AM   [facility]

## 2024-03-10 NOTE — Telephone Encounter (Signed)
 Copy of clearance with provider recommendations faxed to requesting office via Epic fax function.    Lakemore, Harlene HERO, FNP to Me (Selected Message)       03/10/24  1:44 PM He is at least moderate CV risk for procedure due to HF, CAD and previous CVA, but not prohibitive. OK to hold Eliquis  2 days before procedure, resume afterwards as soon as safe to do so RCRI score= 5 points (10% risk of major cardiac event)

## 2024-03-13 ENCOUNTER — Other Ambulatory Visit (HOSPITAL_BASED_OUTPATIENT_CLINIC_OR_DEPARTMENT_OTHER): Payer: Self-pay

## 2024-03-15 ENCOUNTER — Other Ambulatory Visit (HOSPITAL_BASED_OUTPATIENT_CLINIC_OR_DEPARTMENT_OTHER): Payer: Self-pay

## 2024-04-05 ENCOUNTER — Ambulatory Visit: Payer: Self-pay

## 2024-04-05 ENCOUNTER — Ambulatory Visit

## 2024-04-05 ENCOUNTER — Telehealth: Payer: Self-pay | Admitting: *Deleted

## 2024-04-05 ENCOUNTER — Other Ambulatory Visit: Payer: Self-pay

## 2024-04-05 ENCOUNTER — Other Ambulatory Visit (HOSPITAL_COMMUNITY): Payer: Self-pay

## 2024-04-05 VITALS — BP 147/92 | HR 60 | Temp 98.2°F | Ht 70.0 in | Wt 190.8 lb

## 2024-04-05 DIAGNOSIS — E1165 Type 2 diabetes mellitus with hyperglycemia: Secondary | ICD-10-CM | POA: Diagnosis not present

## 2024-04-05 DIAGNOSIS — Z7984 Long term (current) use of oral hypoglycemic drugs: Secondary | ICD-10-CM | POA: Diagnosis not present

## 2024-04-05 DIAGNOSIS — Z794 Long term (current) use of insulin: Secondary | ICD-10-CM | POA: Diagnosis not present

## 2024-04-05 LAB — GLUCOSE, CAPILLARY
Glucose-Capillary: >600 mg/dL (ref 70–99)
Glucose-Capillary: 599 mg/dL (ref 70–99)

## 2024-04-05 MED ORDER — LANTUS SOLOSTAR 100 UNIT/ML ~~LOC~~ SOPN
15.0000 [IU] | PEN_INJECTOR | Freq: Every day | SUBCUTANEOUS | 0 refills | Status: DC
  Filled 2024-04-05: qty 3, 20d supply, fill #0

## 2024-04-05 NOTE — Progress Notes (Signed)
 Established Patient Office Visit  Subjective   Patient ID: Rodney Schultz, male    DOB: November 17, 1960  Age: 63 y.o. MRN: [id number]  Patient seen with Dr Shawn  Here for acute hyperglycemia visit. He has been checking his sugars at home via finger stick that was given to him at his appt in July. Yesterday he noticed his sugars were in the 200s and today they were in the 400s. So he came to the office. He feels completely normal, denies chest pain, SOB, vision changes, difficulty urinating, nausea, vomiting, diarrhea, or stomach pain. He has been taking his medicine as prescribed, jardiance  25 mg daily, lantus  15 units daily, and semaglutide  .25 mg. He can't recall anything out of the ordinary, he may have felt sick on Monday but his sugars were normal then. He hasn't eaten anything today because he hasn't been hungry. He has had lack of appetite for a while. He's thirsty and has finished two large bottles of water while in clinic. Has had normal urination since being in the clinic.         Objective:     BP (!) 147/92 (BP Location: Left Arm, Patient Position: Sitting, Cuff Size: Normal)   Pulse 60   Temp 98.2 F (36.8 C) (Oral)   Ht 5' 10 (1.778 m)   Wt 190 lb 12.8 oz (86.5 kg)   SpO2 97%   BMI 27.38 kg/m    Physical Exam Vitals reviewed.  Constitutional:      General: He is not in acute distress.    Appearance: Normal appearance. He is not ill-appearing or diaphoretic.  HENT:     Nose: Nose normal.  Eyes:     Conjunctiva/sclera: Conjunctivae normal.  Cardiovascular:     Rate and Rhythm: Normal rate and regular rhythm.     Pulses: Normal pulses.     Heart sounds: Normal heart sounds.  Abdominal:     General: There is no distension.     Palpations: Abdomen is soft.     Tenderness: There is no abdominal tenderness.  Skin:    General: Skin is warm.     Coloration: Skin is not pale.  Neurological:     Mental Status: He is alert and oriented to person, place, and time.      Gait: Gait normal.      Results for orders placed or performed in visit on 04/05/24  Glucose, capillary  Result Value Ref Range   Glucose-Capillary >600 (HH) 70 - 99 mg/dL  Glucose, capillary  Result Value Ref Range   Glucose-Capillary 599 (HH) 70 - 99 mg/dL   Comment 1 Call MD NNP PA CNM       The ASCVD Risk score (Arnett DK, et al., 2019) failed to calculate for the following reasons:   Risk score cannot be calculated because patient has a medical history suggesting prior/existing ASCVD    Assessment & Plan:   Assessment & Plan Type 2 diabetes mellitus with hyperglycemia, unspecified whether long term insulin  use (HCC) On 7/3, .25 mg ozempic  was added to degludec, metformin  and empagliflozin . A1c 7, MCR 389. Today, In office glucoses >600, an hour later 599. Patient otherwise in normal state of health. Has increased thirst and is starting to feel hungry, he hasn't eaten anything today but did take his medications including his insulin . It is odd that his sugars are this high on 15 units of degludec and no PO intake. Plan to reorder insulin  to pharmacy just incase something with  wrong with previous insulin  vial. Will have patient take 10 units of insulin  with new vial tonight and his normal 15 units, metformin , and empag tomorrow. Will get labs today and have patient return to clinic tomorrow. Told patient to go to ED if he starts to feel dizzy, GI upset, or generally not well. If acidotic on labs, d/c metformin .  Orders:   insulin  glargine (LANTUS  SOLOSTAR) 100 UNIT/ML Solostar Pen; Inject 15 Units into the skin daily. Discard pen 28 days after first use   Basic metabolic panel with GFR   Basic metabolic panel with GFR   Return for tomorrow .    Viktoria King, DO

## 2024-04-05 NOTE — Telephone Encounter (Signed)
 Called and spoke with patient. Stated sugars have been in the 450's for 1 week.  Stated is taking his medications as ordered.  Given appointment for this afternoon to follow up.

## 2024-04-05 NOTE — Telephone Encounter (Signed)
 FYI Only or Action Required?: Action required by provider: update on patient condition.  Patient was last seen in primary care on 01/20/2024 by Nooruddin, Saad, MD.  Called Nurse Triage reporting Hyperglycemia.  Symptoms began today.  Interventions attempted: Prescription medications: insulin  as prescribed, no missed doses.  Symptoms are: gradually worsening.  Triage Disposition: Call PCP Now  Patient/caregiver understands and will follow disposition?: Yes   Copied from CRM [id number]. Topic: Clinical - Red Word Triage >> Apr 05, 2024  2:38 PM Graeme ORN wrote: Red Word that prompted transfer to Nurse Triage: increased sugar - reading 450 -500   ----------------------------------------------------------------------- From previous Reason for Contact - Scheduling: Patient/patient representative is calling to schedule an appointment. Refer to attachments for appointment information. Reason for Disposition  Blood glucose > 400 mg/dL (77.7 mmol/L)  Answer Assessment - Initial Assessment Questions Additional info: Patient states he used to have sliding scale insulin  but that was discontinued. Caller transferred to CAL for further assistance, dispo: Call PCP now    1. BLOOD GLUCOSE: What is your blood glucose level?      Most recent 450 2. ONSET: When did you check the blood glucose?     today 3. USUAL RANGE: What is your glucose level usually? (e.g., usual fasting morning value, usual evening value)      4. KETONES: Do you check for ketones (urine or blood test strips)? If Yes, ask: What does the test show now?      not 5. TYPE 1 or 2:  Do you know what type of diabetes you have?  (e.g., Type 1, Type 2, Gestational; doesn't know)       6. INSULIN : Do you take insulin ? What type of insulin (s) do you use? What is the mode of delivery? (syringe, pen; injection or pump)?      Lantus  taking as prescribed 7. DIABETES PILLS: Do you take any pills for your diabetes? If  Yes, ask: Have you missed taking any pills recently?      8. OTHER SYMPTOMS: Do you have any symptoms? (e.g., fever, frequent urination, difficulty breathing, dizziness, weakness, vomiting)     Increase thirst, increased urination.  Protocols used: Diabetes - High Blood Sugar-A-AH

## 2024-04-05 NOTE — Patient Instructions (Signed)
 It was wonderful seeing you today!   Please remember to...  1) STOP taking Jardiance    2) Take an extra 10 units of Lantus  tonight  3) Continue 15 units of Lantus  tomorrow before coming to your appointment with us   4) If you start feeling nauseous, light headed, dizzy, or generally not well, we recommend that you go to the ED.   If you have any questions please feel free to the call the clinic at anytime at [phone].  Have a blessed day,  Dr. Charmayne

## 2024-04-06 ENCOUNTER — Other Ambulatory Visit (INDEPENDENT_AMBULATORY_CARE_PROVIDER_SITE_OTHER): Payer: Self-pay

## 2024-04-06 ENCOUNTER — Ambulatory Visit (INDEPENDENT_AMBULATORY_CARE_PROVIDER_SITE_OTHER)

## 2024-04-06 ENCOUNTER — Other Ambulatory Visit (HOSPITAL_COMMUNITY): Payer: Self-pay

## 2024-04-06 VITALS — BP 108/76 | HR 71 | Temp 98.0°F | Ht 70.0 in | Wt 190.0 lb

## 2024-04-06 DIAGNOSIS — E118 Type 2 diabetes mellitus with unspecified complications: Secondary | ICD-10-CM

## 2024-04-06 DIAGNOSIS — Z794 Long term (current) use of insulin: Secondary | ICD-10-CM

## 2024-04-06 LAB — BASIC METABOLIC PANEL WITH GFR
BUN/Creatinine Ratio: 15 (ref 10–24)
BUN/Creatinine Ratio: 15 (ref 10–24)
BUN: 36 mg/dL — ABNORMAL HIGH (ref 8–27)
BUN: 35 mg/dL — ABNORMAL HIGH (ref 8–27)
CO2: 20 mmol/L (ref 20–29)
CO2: 22 mmol/L (ref 20–29)
Calcium: 9.6 mg/dL (ref 8.6–10.2)
Calcium: 9.8 mg/dL (ref 8.6–10.2)
Chloride: 90 mmol/L — ABNORMAL LOW (ref 96–106)
Chloride: 94 mmol/L — ABNORMAL LOW (ref 96–106)
Creatinine, Ser: 2.36 mg/dL — ABNORMAL HIGH (ref 0.76–1.27)
Creatinine, Ser: 2.3 mg/dL — ABNORMAL HIGH (ref 0.76–1.27)
Glucose: 445 mg/dL — ABNORMAL HIGH (ref 70–99)
Potassium: 4.6 mmol/L (ref 3.5–5.2)
Sodium: 127 mmol/L — ABNORMAL LOW (ref 134–144)
Sodium: 130 mmol/L — ABNORMAL LOW (ref 134–144)
eGFR: 30 mL/min/1.73 — ABNORMAL LOW (ref >59)
eGFR: 31 mL/min/1.73 — ABNORMAL LOW (ref >59)

## 2024-04-06 LAB — GLUCOSE, CAPILLARY
Glucose-Capillary: 445 mg/dL — ABNORMAL HIGH (ref 70–99)
Glucose-Capillary: 443 mg/dL — ABNORMAL HIGH (ref 70–99)
Glucose-Capillary: 400 mg/dL — ABNORMAL HIGH (ref 70–99)

## 2024-04-06 MED ORDER — FREESTYLE LIBRE 3 PLUS SENSOR MISC
3 refills | Status: AC
  Filled 2024-04-06: qty 6, fill #0
  Filled 2024-05-01: qty 6, 90d supply, fill #0
  Filled 2024-07-31: qty 6, 90d supply, fill #1

## 2024-04-06 MED ORDER — INSULIN ASPART 100 UNIT/ML IJ SOLN
6.0000 [IU] | Freq: Once | INTRAMUSCULAR | Status: AC
  Administered 2024-04-06: 6 [IU] via SUBCUTANEOUS

## 2024-04-06 NOTE — Progress Notes (Signed)
 Internal Medicine Clinic Attending  I was physically present during the key portions of the resident provided service and participated in the medical decision making of patient's management care. I reviewed pertinent patient test results.  The assessment, diagnosis, and plan were formulated together and I agree with the documentation in the resident's note.  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 In office, he is asymptomatic aside from decreased appetite but conversant, mentating and ambulating appropriately and without difficulty. Initial POC glucose was >600, after hydration was 599. Pt has been very stable on his regimen of MFM, Jardiance  and Lantus  15U >1 year and denies any recent illnesses, trauma or inciting events. Per pt, he just recently re-filled his Lantus  last week and since then has been noting elevated blood sugars and my suspicion is that his most recent vial of Lantus  was faulty. After extensive counseling and shared decision making, we elected to obtain a stat BMP with the understanding that severe electrolyte derangements or acidosis would prompt referral to ED. We also recommended that he present to the ED if he were to become symptomatic. For now, will refill new vial of Lantus  and we instructed pt to take an additional 10U tonight and resume normal Lantus  15U daily tomorrow and return to clinic for POC glucose and further monitoring, discussion of BMP results. He voiced understanding & agreement w/ this plan. - Lantus  10U to be taken tonight - hold Jardiance   - RTC tomorrow AM for repeat visit - ED precautions provided

## 2024-04-06 NOTE — Assessment & Plan Note (Signed)
 Labs from yesterday were less concerning for acidosis as CO2 20 although his K did not result do to lab error. Na 127 likely 2/2 dehydration and elevated glucose. Repeat BMP this morning with improvement Na 130, CO2 22, K 4.6. Glucose 445, improved. Gave 6 units aspart in clinic and had patient weight 2 hours before recheck. While waiting, pharmacist placed [place] on left arm. 443 on recheck. Patient then left for lunch and came back to have another recheck. Recheck at 1614 improved to 400. Patient is responding although slowly. His benign presentation makes me wonder how often he has sugars this high and about his medication adherence. Plan to have patient not give himself anymore insulin  tonight, just take his home metformin  pill this evening and his normal regimen tomorrow morning. Normal regimen being 15 u glargine, empag 25, metformin  500 BID. With .25 mg ozempic  on Mondays. Discussed patient to go to ER if he starts to feel sick. Will have him call me tomorrow afternoon to let me know what his Herlene reading is. If increasing will have him increase glargine to 18 units daily. If improving, will have him continue current regimen. Follow up on Monday in the office.  Orders:   Basic metabolic panel with GFR   insulin  aspart (novoLOG ) injection 6 Units

## 2024-04-06 NOTE — Patient Instructions (Addendum)
 It was wonderful seeing you today!   Please remember to...  1) Continue to monitor your sugars  2) Take your normal regimen starting tomorrow, 9/19. Lantus  15 units, empagliflozin , and your metformin . On Monday morning, you'll also inject your ozempic .   3) Call [phone] tomorrow afternoon and we will make sure your sugars are still getting better.   4) Remember to drink plenty of water and eat healthy, regular meals.   5) Bring all your medicines with you to your appointment on Monday  If you have any questions please feel free to the call the clinic at anytime at [phone].  Have a blessed day,  Dr. Charmayne

## 2024-04-06 NOTE — Progress Notes (Signed)
 Established Patient Office Visit  Subjective   Patient ID: Rodney Schultz, male    DOB: 04-17-1961  Age: 63 y.o. MRN: [id number]  Patient came to clinic for an acute visit yesterday as his home sugars were 500+. Patient reports he took his glargine 15 units, empagliflozin  and metformin  earlier that morning which he says he's good at taking his medicine. At that time he felt completely normal and denied sick contacts. CBGs >600 and 500. He was very thirsty, drinking a lot of water, and peeing appropriately. Plan was to refill his glargine at clinic pharmacy (in case the previous vial he got a [facility] was spoiled/degraded) and have him administer 10 units last night. As well as give 15 units this morning with his metformin  and come back to clinic this morning.   Patient reported back this morning feeling well and says he checked his sugar this morning and it was 450. He took 10 units glargine, metformin , and empagliflozin  this morning.   Diabetes        Objective:     BP 108/76 (BP Location: Left Arm, Patient Position: Sitting, Cuff Size: Large)   Pulse 71   Temp 98 F (36.7 C) (Oral)   Ht 5' 10 (1.778 m)   Wt 190 lb (86.2 kg)   SpO2 100%   BMI 27.26 kg/m    Physical Exam Vitals reviewed.  Constitutional:      General: He is not in acute distress.    Appearance: Normal appearance. He is not ill-appearing, toxic-appearing or diaphoretic.  HENT:     Nose: Nose normal.  Eyes:     Conjunctiva/sclera: Conjunctivae normal.  Cardiovascular:     Rate and Rhythm: Normal rate and regular rhythm.     Heart sounds: Normal heart sounds.  Pulmonary:     Effort: Pulmonary effort is normal.     Breath sounds: Normal breath sounds.  Skin:    General: Skin is warm.  Neurological:     General: No focal deficit present.     Mental Status: He is alert and oriented to person, place, and time.  Psychiatric:        Behavior: Behavior normal.      Results for orders placed  or performed in visit on 04/06/24  Basic metabolic panel with GFR  Result Value Ref Range   Glucose 445 (H) 70 - 99 mg/dL   BUN 35 (H) 8 - 27 mg/dL   Creatinine, Ser 7.69 (H) 0.76 - 1.27 mg/dL   eGFR 31 (L) >40 fO/fpw/8.26   BUN/Creatinine Ratio 15 10 - 24   Sodium 130 (L) 134 - 144 mmol/L   Potassium 4.6 3.5 - 5.2 mmol/L   Chloride 94 (L) 96 - 106 mmol/L   CO2 22 20 - 29 mmol/L   Calcium  9.8 8.6 - 10.2 mg/dL  Glucose, capillary  Result Value Ref Range   Glucose-Capillary 445 (H) 70 - 99 mg/dL   Comment 1 Notify RN    Comment 2 Call MD NNP PA CNM    Comment 3 Document in Chart   Glucose, capillary  Result Value Ref Range   Glucose-Capillary 443 (H) 70 - 99 mg/dL  Glucose, capillary  Result Value Ref Range   Glucose-Capillary 400 (H) 70 - 99 mg/dL      The ASCVD Risk score (Arnett DK, et al., 2019) failed to calculate for the following reasons:   Risk score cannot be calculated because patient has a medical history suggesting prior/existing ASCVD  Assessment & Plan:   Assessment & Plan Type 2 diabetes with complication (HCC) Labs from yesterday were less concerning for acidosis as CO2 20 although his K did not result do to lab error. Na 127 likely 2/2 dehydration and elevated glucose. Repeat BMP this morning with improvement Na 130, CO2 22, K 4.6. Glucose 445, improved. Gave 6 units aspart in clinic and had patient weight 2 hours before recheck. While waiting, pharmacist placed [place] on left arm. 443 on recheck. Patient then left for lunch and came back to have another recheck. Recheck at 1614 improved to 400. Patient is responding although slowly. His benign presentation makes me wonder how often he has sugars this high and about his medication adherence. Plan to have patient not give himself anymore insulin  tonight, just take his home metformin  pill this evening and his normal regimen tomorrow morning. Normal regimen being 15 u glargine, empag 25, metformin  500 BID. With .25  mg ozempic  on Mondays. Discussed patient to go to ER if he starts to feel sick. Will have him call me tomorrow afternoon to let me know what his Herlene reading is. If increasing will have him increase glargine to 18 units daily. If improving, will have him continue current regimen. Follow up on Monday in the office.  Orders:   Basic metabolic panel with GFR   insulin  aspart (novoLOG ) injection 6 Units   Return in about 4 days (around 04/10/2024) for fu sugar.    Viktoria King, DO

## 2024-04-06 NOTE — Progress Notes (Signed)
 Consulted by MD during in-person appt today at [facility] to assist patient with setup of Freestyle Libre 3 sensor. Assisted patient in downloading app, applying device, and connecting to [facility] LibreView account. Patient requested to pick up 3 mo supply of sensors next month from the [facility] at [place]. Will collaborate with physician to place orders.   Will follow-up LibreView report next week given patient's acutely elevated BG today.   Lorain Baseman, PharmD [facility] [phone]

## 2024-04-06 NOTE — Addendum Note (Signed)
 Addended by: SHAWN SICK on: 04/06/2024 09:57 AM   Modules accepted: Level of Service

## 2024-04-07 NOTE — Progress Notes (Signed)
 Internal Medicine Clinic Attending  I was physically present during the key portions of the resident provided service and participated in the medical decision making of patient's management care. I reviewed pertinent patient test results.  The assessment, diagnosis, and plan were formulated together and I agree with the documentation in the resident's note.  50M Pmhx previously well-controlled T2DM that we saw in clinic yesterday now returns for scheduled follow up and repeat glucose check. After taking 10U Lantus  from newly prescribed vial, he reports improvement in his home glucose readings. He took his usual 15U Lantus  this AM as well as the remainder of his medications. He feels his appetite has improved since yesterday and has not developed any new symptoms.  In clinic, a repeat BMP was drawn which showed improving hyponatremia, normal potassium, stable renal function and no metabolic acidosis. POC in office showed elevated glucose to 440 and he was given 6u aspart prior to eating lunch. After he returned to clinic, his BG was stable from prior. I presume that with continued medication adherence, his blood glucose will continue to trend down, especially in the setting of his poor PO intake. During his visit, CGM was started for the patient and he was instructed on how to monitor his sugars which he will do throughout the weekend. We will have the patient RTC on Monday for repeat labs and BG monitoring and eval for further need for insulin  titration.   Jone Dauphin MD

## 2024-04-08 NOTE — Progress Notes (Signed)
 Internal Medicine Clinic Attending  I was physically present during the key portions of the resident provided service and participated in the medical decision making of patient's management care. I reviewed pertinent patient test results.  The assessment, diagnosis, and plan were formulated together and I agree with the documentation in the resident's note.  Jeanelle Layman CROME, MD

## 2024-04-10 ENCOUNTER — Ambulatory Visit (INDEPENDENT_AMBULATORY_CARE_PROVIDER_SITE_OTHER)

## 2024-04-10 ENCOUNTER — Other Ambulatory Visit (HOSPITAL_COMMUNITY): Payer: Self-pay

## 2024-04-10 VITALS — BP 106/78 | HR 63

## 2024-04-10 DIAGNOSIS — I509 Heart failure, unspecified: Secondary | ICD-10-CM

## 2024-04-10 DIAGNOSIS — Z794 Long term (current) use of insulin: Secondary | ICD-10-CM

## 2024-04-10 DIAGNOSIS — E1165 Type 2 diabetes mellitus with hyperglycemia: Secondary | ICD-10-CM

## 2024-04-10 DIAGNOSIS — E118 Type 2 diabetes mellitus with unspecified complications: Secondary | ICD-10-CM

## 2024-04-10 MED ORDER — INSULIN LISPRO (1 UNIT DIAL) 100 UNIT/ML (KWIKPEN)
PEN_INJECTOR | SUBCUTANEOUS | 1 refills | Status: DC
  Filled 2024-04-10: qty 3, 30d supply, fill #0

## 2024-04-10 MED ORDER — LANTUS SOLOSTAR 100 UNIT/ML ~~LOC~~ SOPN
18.0000 [IU] | PEN_INJECTOR | Freq: Every day | SUBCUTANEOUS | 3 refills | Status: DC
  Filled 2024-04-10: qty 15, 83d supply, fill #0

## 2024-04-10 MED ORDER — SEMAGLUTIDE(0.25 OR 0.5MG/DOS) 2 MG/3ML ~~LOC~~ SOPN
0.5000 mg | PEN_INJECTOR | SUBCUTANEOUS | 3 refills | Status: DC
  Filled 2024-04-10: qty 3, 28d supply, fill #0

## 2024-04-10 NOTE — Patient Instructions (Addendum)
 It was nice to see you today!  Your goal blood sugar is 80-130 before eating and less than 180 after eating. Use your [facility] app to monitor your sugars. Double check your sugars with your glucometer if your symptoms do not match the number.  Please schedule a follow-up appointment at the heart clinic: [phone]. It is time for you to have a repeat echo.  Medication Changes:  Increase Lantus  to 18 units once daily  Increase Ozempic  to 0.5 mg weekly  Continue Jardiance  25 mg daily  Continue metformin  XR 500 mg BID  You can pick up Novolog  (fast acting insulin ) from the pharmacy to have as back up for VERY high sugars. I do not think you will need this since we are adjusting your other medications. They may fill a different version called Humalog  if your insurance prefers.   Continue all other medication the same.   Keep up the good work with diet and exercise. Aim for a diet full of vegetables, fruit and lean meats (chicken, [place], fish). Try to limit salt intake by eating fresh or frozen vegetables (instead of canned), rinse canned vegetables prior to cooking and do not add any additional salt to meals.  Call if you have any issues!  Lorain Baseman, PharmD [facility] [phone]

## 2024-04-10 NOTE — Progress Notes (Signed)
 04/10/2024 Name: Rodney Schultz MRN: [id number] DOB: 04-20-1961  Chief Complaint  Patient presents with   Diabetes   Congestive Heart Failure    Rodney Schultz is a 63 y.o. year old male who was referred for medication management by their primary care provider, [name], DO. They presented for a face to face visit today.   They were referred to the pharmacist by their PCP for assistance in managing diabetes . PMH includes HTN, HFrEF (EF initially 20-25% in Dec 2022, improved to 40-45% in August 2024 with mildly reduced RV), T2DM (HHS in Jan 2024), CKD3b, HLD, hx of tobacco use, CVA (2024), seizure (2024, - same admission as CVA), CAD (R/LHC in 2023 with diffuse disease, medically managed), Afib.   Subjective: Patient was last seen by PCP, [name], DO, on 04/06/24. At last visit, patient had severely elevated BG and was given 6 units of Novolog  in clinic. He was set up with a Freestyle Libre CGM. He was instructed to return the following week for repeat BP and BG monitoring. He was instructed to increase Lantus  to 18 units daily.  Today, patient presents in  good spirits and presents without  any assistance. He has some, but not all, of his medication bottles with him today. He reports he is feeling better. He has had some trouble accessing his Herlene app, but upon review on his phone, it is still connected and working. Reading 206 mg/dL this AM.    Care Team: Primary Care Provider: [name], DO ; Next Scheduled Visit: needs to be scheduled   Medication Access/Adherence  Current Pharmacy:  [place] [facility] [address] [place] [address] Phone: [phone] Fax: [phone]   Patient reports affordability concerns with their medications: No  Patient reports access/transportation concerns to their pharmacy: No  - Using [facility] pharmacy Patient reports adherence concerns with their medications:  Yes  - has several medication  bottles with him today with very delayed fill dates, but patient insists he is taking them every day.  Recent fill dates:  - Eliquis  01/24/24 for 30ds - has bottle with medication remaining, states he is taking twice daily - Atorvastatin  02/14/24 for 30ds - has bottle with medication remaining - carvedilol  02/14/24 for 30ds - reports he has this at home, not with him today - Jardiance  01/24/24 for 30ds - reports he has this at home, not with him today - hydralazine  02/23/24 for 30ds - has bottle with him with medication remaining, states he is taking three times daily - Vascepa  11/11/23 for 30ds- does not have with him, reports he keeps in his fridge and takes 1 tab BID - isosorbide  mononitrate 05/13/23 for 30ds - has almost full bottle with him today, but reports he has been taking - levetiracetam  (Keppra ) 03/24/23 - reports he was told to stop taking this - metformin  XR 01/14/24 for 30ds - has bottle with medication remaining - potassium 10/30/23 for 30 ds- reports he takes every now and then - Entresto  02/23/24 for 30ds - has bottle with  medication remaining, confirms taking twice daily - torsemide  03/15/24 for 30ds - reports he is taking daily - Ozempic  01/24/24 for 60ds - reports he is taking weekly, denies missed doses - Aspirin  81 mg daily - has bottle with him, though was previously told to stop when using with Eliquis . - Insulin  glargine (Lantus ) - does not have with him today, but has been filling regularly   Reports that he  took all his medications this AM around 6AM. He reports that he uses a pill box to organize his medications, but instead of filling the pill box with the medications he needs to take each day, he pours the contents of his bottle into one slot (ex: puts hydralazine  in the Saturday slot, etc - then goes down the line and takes each pill). Reports that he lines the medication bottles that don't fit next to it to take. Reports that he can remember which pills he needs to take twice  daily.   Diabetes:  Current medications: Lantus  18 units daily (reports he is still taking 15 units daily), Jardiance  25 mg daily, metformin  XR 500 mg BID, Ozempic  0.25 mg weekly (Mondays), has novolog  pen with him from Feb 2024 hospitalization - states he took 10 units Saturday AM to bring BG down and now he has run out and would like a refill. Medications tried in the past: glipizide , Novolog   Denies GI AE with Ozempic  - though when he describes his diet, reports that often only eats 1 meal/day, but this was normal for him prior to Ozempic .  Current glucose readings: Freestyle Libre 3+      Observed patterns: patient reports that he took 10 units of Novolog  on Saturday AM. He reports that Saturday PM he was helping a friend move a washer machines and his BG finally came back into normal range. CGM reports that he had a low, but patient reports with glucometer BG only dropped to 90 mg/dL.   Patient denies hypoglycemic s/sx including dizziness, shakiness, sweating. Patient reports hyperglycemic symptoms including polyuria, polydipsia, polyphagia, nocturia, neuropathy, blurred vision when his BG was elevated > 300 mg/dL. Has been improved over the past 2 days.  Current meal patterns: Sometimes only eats 1 meal per day. Yesterday reports that he had fried fish for dinner, no other meals.  - Drinks: water and diet soda. Denies intake of juice or regular soda.  Current physical activity: Was physically active on Saturday PM which correlated with drop in BG.  Current medication access support: [employer] Dual Complete   Heart Failure (EF 40-45% in Aug 2024, improved from 20-25% in 2022):  Current medications:  ACEi/ARB/ARNI: Entresto  49/51 mg BID (suspect intermittent adherence given fill hx) SGLT2i: Jardiance  25 mg daily (suspect intermittent adherence given fill hx) Beta blocker: carvedilol  6.25 mg BID (suspect intermittent adherence given fill hx) Mineralocorticoid Receptor Antagonist:  spironolactone  25 mg daily (suspect intermittent adherence given fill hx) Diuretic regimen: torsemide  20 mg daily + potassium 20 mEq daily (not taking)  Current home blood pressure readings: 118/77 mmHg this AM  Patient denies volume overload signs or symptoms including shortness of breath, lower extremity edema, increased use of pillows at night   Objective:  BP Readings from Last 3 Encounters:  04/10/24 106/78  04/06/24 108/76  04/05/24 (!) 147/92    Lab Results  Component Value Date   HGBA1C 7.0 (A) 01/20/2024   HGBA1C 6.4 (A) 06/01/2023   HGBA1C 6.6 (A) 01/13/2023       Latest Ref Rng & Units 04/06/2024    8:56 AM 04/05/2024    4:25 PM 11/09/2023   11:42 AM  BMP  Glucose 70 - 99 mg/dL 554  CANCELED  866   BUN 8 - 27 mg/dL 35  36  23   Creatinine 0.76 - 1.27 mg/dL 7.69  7.63  7.71   BUN/Creat Ratio 10 - 24 15  15     Sodium 134 - 144 mmol/L 130  127  143   Potassium 3.5 - 5.2 mmol/L 4.6  CANCELED  4.1   Chloride 96 - 106 mmol/L 94  90  109   CO2 20 - 29 mmol/L 22  20  24    Calcium  8.6 - 10.2 mg/dL 9.8  9.6  8.7     Lab Results  Component Value Date   CHOL 90 04/14/2023   HDL 20 (L) 04/14/2023   LDLCALC 41 04/14/2023   LDLDIRECT 73 08/20/2022   TRIG 146 04/14/2023   CHOLHDL 4.5 04/14/2023    Medications Reviewed Today     Reviewed by Brinda Lorain SQUIBB, RPH (Pharmacist) on 04/10/24 at 1217  Med List Status: <None>   Medication Order Taking? Sig Documenting Provider Last Dose Status Informant  apixaban  (ELIQUIS ) 5 MG TABS tablet [id number] Yes Take 1 tablet (5 mg total) by mouth 2 (two) times daily. Nooruddin, Saad, MD  Active   atorvastatin  (LIPITOR ) 80 MG tablet [id number] Yes Take 1 tablet (80 mg total) by mouth daily. Fernand Prost, MD  Active   Blood Glucose Monitoring Suppl (ACCU-CHEK GUIDE) w/Device KIT [id number]  Use as directed twice daily.   Active   carvedilol  (COREG ) 6.25 MG tablet [id number] Yes Take 1 tablet (6.25 mg total) by mouth 2 (two) times daily.  Rolan Ezra RAMAN, MD  Active   Continuous Glucose Sensor (FREESTYLE LIBRE 3 PLUS SENSOR) [place] [id number] Yes Change sensor every 15 days. Charmayne Holmes, DO  Active   empagliflozin  (JARDIANCE ) 25 MG TABS tablet [id number] Yes Take 1 tablet (25 mg total) by mouth daily before breakfast. Jolaine Pac, DO  Active    Patient not taking:   Discontinued 04/10/24 1021 (Patient has not taken in last 30 days)   glucose blood (ACCU-CHEK GUIDE TEST) test strip [id number]  Use three times daily to check blood sugars Elnora Ip, MD  Active   hydrALAZINE  (APRESOLINE ) 50 MG tablet [id number] Yes Take 1 tablet (50 mg total) by mouth 3 (three) times daily. Rolan Ezra RAMAN, MD  Active   icosapent  Ethyl (VASCEPA ) 1 g capsule [id number] Yes Take 2 capsules (2 g total) by mouth 2 (two) times daily.  Patient taking differently: Take 2 capsules (2 g total) by mouth 2 (two) times daily.   Rolan Ezra RAMAN, MD  Active   insulin  glargine (LANTUS  SOLOSTAR) 100 UNIT/ML Solostar Pen [id number]  Inject 18 Units into the skin daily. Discard pen 28 days after first use. May increase up to 20 units daily if instructed by your provider. Charmayne Holmes, DO  Active   insulin  lispro (HUMALOG ) 100 UNIT/ML KwikPen [id number] Yes Inject up to once daily into the skin as needed for elevated blood sugars per sliding scale: BG 250-300: 4 units, 300-350: 6 units, 350-400: 8 units, > 400: 10 units and call PCP. Charmayne Holmes, DO  Active   Insulin  Pen Needle (UNIFINE PENTIPS) 32G X 4 MM MISC [id number]  Use daily with insulin  Rosan Dayton BROCKS, DO  Active   isosorbide  mononitrate (IMDUR ) 30 MG 24 hr tablet [id number] Yes Take 1 tablet (30 mg total) by mouth daily. Rolan Ezra RAMAN, MD  Active   Lancets Micro Thin 33G [place] [id number]  Use to check blood sugar twice daily Rosan Dayton BROCKS, DO  Active   levETIRAcetam  (KEPPRA ) 250 MG tablet [id number]  Take 1 tablet (250 mg total) by mouth 2 (two) times daily.  Patient not taking: Reported on  04/10/2024   Fernand Prost, MD  Active     Discontinued 09/13/20 1454  metFORMIN  (GLUCOPHAGE -XR) 500 MG 24 hr tablet [id number] Yes Take 1 tablet (500 mg total) by mouth 2 (two) times daily. Jolaine Pac, DO  Active   potassium chloride  SA (KLOR-CON  M) 20 MEQ tablet [id number] Yes Take 1 tablet (20 mEq total) by mouth daily.  Patient taking differently: Take 1 tablet (20 mEq total) by mouth daily.   [place], [place], FNP  Active   sacubitril -valsartan  (ENTRESTO ) 49-51 MG [id number] Yes Take 1 tablet by mouth 2 (two) times daily. Marylu Gee, DO  Active   Semaglutide ,0.25 or 0.5MG /DOS, 2 MG/3ML NELMA [id number]  Inject 0.5 mg into the skin once a week. Charmayne Holmes, DO  Active   spironolactone  (ALDACTONE ) 25 MG tablet [id number] Yes Take 1 tablet (25 mg total) by mouth daily. Nooruddin, Saad, MD  Active   torsemide  (DEMADEX ) 20 MG tablet [id number] Yes Take 1 tablet (20 mg total) by mouth daily. [place], Harlene HERO, [place]  Active               Assessment/Plan:   Diabetes: - Currently uncontrolled with current TIR of 29% below goal < 70% over the past 4 days, but improving with BG mostly in target over the past 2 days. Medication adherence appears suboptimal, though patient reports that he takes his medications every day as prescribed. I do believe he is adherent to insulin , but could've missed a dose, had a faulty batch, or missed his other maintenance medications that led to severe hyperglycemia last week and prior. Anticipate that with worsening kidney function, patient will not be able to stay on metformin  long term if eGFR is consistently < 30 mL/min. Will follow-up BMP from today. To promote improved glycemic control, will have patient titrate basal insulin  as previously instructed and titrate Ozempic , as he is still taking the starting dose of 0.25 mg weekly. He denies GI AE, but will monitor closely for impact on appetite suppression.  - Last UACR July 2025: 389 mg/dL. Patient is  appropriately treated with SGLT2i - Patient denies personal or family history of multiple endocrine neoplasia type 2, medullary thyroid cancer; personal history of pancreatitis or gallbladder disease. - Reviewed long term cardiovascular and renal outcomes of uncontrolled blood sugar - Reviewed goal A1c, goal fasting, and goal 2 hour post prandial glucose - Reviewed hypoglycemia management plan and the rule of 15 - Reviewed dietary modifications including  utilizing the healthy plate method, limiting portion size of carbohydrate foods, increasing intake of protein and non-starchy vegetables. Encouraged him to eat 3 meals per day. Counseled patient to stay hydrated with water throughout the day. - Reviewed lifestyle modifications including: aiming for 150 minutes of moderate intensity exercise every week.  - Recommend to INCREASE insulin  glargine (Lantus ) to 18 units daily as previously instructed - Recommend to INCREASE Ozempic  to 0.5 mg weekly. Patient educated on possible GI AE. - Recommend to continue Jardiance  25 mg daily - Recommend to continue metformin  XR 500 mg BID, though may discontinue pending results of BMP - Provided patient with prescription for one pen of insulin  lispro (Humalog ) to use up to once daily PRN per sliding scale (BG 250-300: 4 units, 300-350: 6 units, 350-400: 8 units, > 400: 10 units and call PCP ) for severely elevated BG given recent unexplained hyperglycemia. Patient educated that this is back-up therapy, and I do not anticipate he will need this if he makes the changes we discussed today. Patient expressed understanding. - Recommend to check glucose continuously using FL3+ CGM. Assisted patient in pinning his  Libre app to his home screen. - Next A1C due 04/21/24.       Heart Failure: - Currently appropriately managed, though patient's adherence appears suboptimal. BP well controlled today. Patient appears euvolemic on exam. No s/sx of hypotension. He is due for Adv HF  f/u and repeat echo. Provided him with HF clinic phone number today and instructions to schedule appointment. Should continue to closely monitor Scr and eGFR to determine appropriateness of spironolactone .  - Reviewed appropriate blood pressure monitoring technique and reviewed goal blood pressure - Reviewed to weigh daily and when to contact cardiology with weight gain - Recommend to continue current regimen:   Current medications:  ACEi/ARB/ARNI: Entresto  49/51 mg BID SGLT2i: Jardiance  25 mg daily  Beta blocker: carvedilol  6.25 mg BID  Mineralocorticoid Receptor Antagonist: spironolactone  25 mg daily Diuretic regimen: torsemide  20 mg daily  Will follow-up BMP to determine whether patient needs to restart potassium 20 mEq daily    Medication Management: - Currently strategy insufficient to maintain appropriate adherence to prescribed medication regimen. Patient exhibits difficulty managing his medications. Feel he would benefit from pill packs, though hesitant to start without confirming adherence to regimen first and obtaining appropriate lab monitoring. Will follow-up in three weeks to reassess. - Patient reports that he was instructed to stop levetiracetam  (Keppra ) and has not filled since Sept 2024, though he never followed up with neurology to confirm this. Will discuss with PCP to decide whether to remove this from medication list.  - Patient reports he was told to take aspirin  and Eliquis , though note from March 2024 instructed him to STOP aspirin  and continue Eliquis . Noted after visit completed. Attempted to call patient after visit, but unable to reach. Will educate patient to stop aspirin  when following up on labs tomorrow.  - Patient no longer has current supply of ezetimibe . Due for repeat lipid panel, but suspect he will need to continue ezetimibe  to achieve LDL-C goal.  - Patient appears to be intermittently taking Vascepa . He has an indication to reduce CV risk with hx of TG > 150  mg/dL, but could consider discontinuing to manage pill burden.  - Will continue to follow for medication management  Written patient instructions provided. Patient verbalized understanding of treatment plan.    Follow Up Plan:  Pharmacist 05/01/24 - will follow-up with CGM review remotely in one week PCP clinic visit 05/22/24   Lorain Baseman, PharmD [facility] [phone]

## 2024-04-11 ENCOUNTER — Telehealth: Payer: Self-pay

## 2024-04-11 LAB — BASIC METABOLIC PANEL WITH GFR
BUN/Creatinine Ratio: 12 (ref 10–24)
BUN: 25 mg/dL (ref 8–27)
CO2: 19 mmol/L — ABNORMAL LOW (ref 20–29)
Calcium: 9.7 mg/dL (ref 8.6–10.2)
Chloride: 101 mmol/L (ref 96–106)
Creatinine, Ser: 2.17 mg/dL — ABNORMAL HIGH (ref 0.76–1.27)
Glucose: 187 mg/dL — ABNORMAL HIGH (ref 70–99)
Potassium: 4.9 mmol/L (ref 3.5–5.2)
Sodium: 136 mmol/L (ref 134–144)
eGFR: 33 mL/min/1.73 — ABNORMAL LOW (ref >59)

## 2024-04-11 NOTE — Progress Notes (Signed)
 Attempted to outreach patient to review BMP from yesterday. Scr continued to improve. K WNL - would not recommend restarting potassium supplementation. LVM with call back instructions, will also send MyChart message.     Latest Ref Rng & Units 04/10/2024   11:13 AM 04/06/2024    8:56 AM 04/05/2024    4:25 PM  BMP  Glucose 70 - 99 mg/dL 812  554  CANCELED   BUN 8 - 27 mg/dL 25  35  36   Creatinine 0.76 - 1.27 mg/dL 7.82  7.69  7.63   BUN/Creat Ratio 10 - 24 12  15  15    Sodium 134 - 144 mmol/L 136  130  127   Potassium 3.5 - 5.2 mmol/L 4.9  4.6  CANCELED   Chloride 96 - 106 mmol/L 101  94  90   CO2 20 - 29 mmol/L 19  22  20    Calcium  8.6 - 10.2 mg/dL 9.7  9.8  9.6    Lorain Baseman, PharmD [facility] [phone]

## 2024-05-01 ENCOUNTER — Ambulatory Visit (INDEPENDENT_AMBULATORY_CARE_PROVIDER_SITE_OTHER)

## 2024-05-01 ENCOUNTER — Other Ambulatory Visit (HOSPITAL_COMMUNITY): Payer: Self-pay

## 2024-05-01 VITALS — BP 122/88 | HR 62 | Wt 195.4 lb

## 2024-05-01 DIAGNOSIS — Z7985 Long-term (current) use of injectable non-insulin antidiabetic drugs: Secondary | ICD-10-CM | POA: Diagnosis not present

## 2024-05-01 DIAGNOSIS — Z79899 Other long term (current) drug therapy: Secondary | ICD-10-CM

## 2024-05-01 DIAGNOSIS — Z794 Long term (current) use of insulin: Secondary | ICD-10-CM

## 2024-05-01 DIAGNOSIS — Z91148 Patient's other noncompliance with medication regimen for other reason: Secondary | ICD-10-CM | POA: Diagnosis not present

## 2024-05-01 DIAGNOSIS — I5022 Chronic systolic (congestive) heart failure: Secondary | ICD-10-CM | POA: Diagnosis not present

## 2024-05-01 DIAGNOSIS — E119 Type 2 diabetes mellitus without complications: Secondary | ICD-10-CM

## 2024-05-01 DIAGNOSIS — I63512 Cerebral infarction due to unspecified occlusion or stenosis of left middle cerebral artery: Secondary | ICD-10-CM

## 2024-05-01 DIAGNOSIS — Z8673 Personal history of transient ischemic attack (TIA), and cerebral infarction without residual deficits: Secondary | ICD-10-CM | POA: Diagnosis not present

## 2024-05-01 DIAGNOSIS — E1165 Type 2 diabetes mellitus with hyperglycemia: Secondary | ICD-10-CM

## 2024-05-01 DIAGNOSIS — E118 Type 2 diabetes mellitus with unspecified complications: Secondary | ICD-10-CM

## 2024-05-01 LAB — POCT GLYCOSYLATED HEMOGLOBIN (HGB A1C): HbA1c, POC (controlled diabetic range): 11.4 % — AB (ref 0.0–7.0)

## 2024-05-01 LAB — GLUCOSE, CAPILLARY: Glucose-Capillary: 130 mg/dL — ABNORMAL HIGH (ref 70–99)

## 2024-05-01 MED ORDER — ISOSORBIDE MONONITRATE ER 30 MG PO TB24
30.0000 mg | ORAL_TABLET | Freq: Every day | ORAL | 3 refills | Status: AC
  Filled 2024-05-01: qty 90, 90d supply, fill #0
  Filled 2024-07-31: qty 90, 90d supply, fill #1

## 2024-05-01 MED ORDER — EMPAGLIFLOZIN 25 MG PO TABS
25.0000 mg | ORAL_TABLET | Freq: Every day | ORAL | 2 refills | Status: AC
  Filled 2024-05-01 – 2024-06-19 (×3): qty 90, 90d supply, fill #0

## 2024-05-01 MED ORDER — ATORVASTATIN CALCIUM 80 MG PO TABS
80.0000 mg | ORAL_TABLET | Freq: Every day | ORAL | 3 refills | Status: AC
  Filled 2024-05-01: qty 90, 90d supply, fill #0
  Filled 2024-07-31: qty 90, 90d supply, fill #1

## 2024-05-01 NOTE — Progress Notes (Signed)
 05/01/2024 Name: Rodney Schultz MRN: [id number] DOB: [birth year]  Chief Complaint  Patient presents with   Diabetes   Congestive Heart Failure    Rodney Schultz is a 63 y.o. year old male who was referred for medication management by their primary care provider, [name], DO. They presented for a face to face visit today.   They were referred to the pharmacist by their PCP for assistance in managing diabetes . PMH includes HTN, HFrEF (EF initially 20-25% in Dec 2022, improved to 40-45% in August 2024 with mildly reduced RV), T2DM (HHS in Jan 2024), CKD3b, HLD, hx of tobacco use, CVA (2024), seizure (2024, - same admission as CVA), CAD (R/LHC in 2023 with diffuse disease, medically managed), Afib.   Subjective: Patient was last seen by PCP, [name], DO, on 04/06/24. At last visit, patient had severely elevated BG and was given 6 units of Novolog  in clinic. He was set up with a Freestyle Libre CGM. He was instructed to return the following week for repeat BP and BG monitoring. He was instructed to increase Lantus  to 18 units daily. At pharmacy appt on 04/10/24, patient reported BG improved and symptoms of hyperglycemia had resolved, but still not controlled. He had not yet increased Lantus  to 18 units. He brought his medications with him, many of which were overdue for refills, but still contained a lot of medication, indicating non-adherence. He instructed to increase Lantus  to 18 units daily and increase Ozempic  to 0.5 mg weekly. He was instructed not to taking aspirin  with Eliquis . Upon reviewing repeat BMP, instructed to stop potassium supplementation.  Today, patient presents in good spirits and presents without  any assistance. He reports that one week ago, he stopped taking all his diabetes medications. He had been having some low blood sugars so he stopped Lantus  18 units daily, last Monday. He reports his last dose of Ozempic  (0.25 mg) was 2 Mondays ago. He also has not taken  Jardiance  or metformin . He has also not taken any rapid acting insulin  recently. TIR over the past week (without insulin  or any DM medication) is 96%. He reports he stopped drinking Pepsi and stopped adding sugar to his coffee. He reports he did take his BP medications this AM (including carvedilol , hydralazine , Entresto , spironlactone, and torsemide ). He confirmed he is not taking aspirin  with Eliquis  and that he will not take his potassium supplement. He is wanting to pick up refills of meds and sensors from the pharmacy after his visit today.  Care Team: Primary Care Provider: [name], DO ; Next Scheduled Visit: 05/22/24   Medication Access/Adherence  Current Pharmacy:  [facility] [address] [place] [address] Phone: [phone] Fax: [phone]   Patient reports affordability concerns with their medications: No  Patient reports access/transportation concerns to their pharmacy: No  - Using [facility] pharmacy Patient reports adherence concerns with their medications:  Yes  - brought meds with him on 04/10/24 - patient reported he was taking all meds, but last fill dates were several months prior. He did not have Jardiance  or Vascepa  with him. Will assist in filling CGM, Eliquis , atorvastatin , carvedilol , Jardiance , hydralazine , isosorbide , Entresto , spironolactone , and torsemide  today.  He reports that he uses a pill box to organize his medications, but instead of filling the pill box with the medications he needs to take each day, he pours the contents of his bottle into one slot (ex: puts hydralazine  in the Saturday slot, etc -  then goes down the line and takes each pill). Reports that he lines the medication bottles that don't fit next to it to take. Reports that he can remember which pills he needs to take twice daily.   Diabetes:  Current medications: Lantus  18 units daily (not taking, stopped one week ago, Jardiance  25 mg daily  (not taking), metformin  XR 500 mg BID (not taking), Ozempic  0.25 mg weekly (Mondays - last dose 04/17/24), Humalog  once daily PRN per sliding scale (BG 250-300: 4 units, 300-350: 6 units, 350-400: 8 units, > 400: 10 units and call PCP ) for severely elevated BG (has not taken)  Medications tried in the past: glipizide , Novolog   Current glucose readings: Freestyle Libre 3+ (values from 10/7 to 10/13 indicate control without insulin  or any other DM medications)      Observed patterns: Patient has been able to maintain glycemic control without insulin .   Patient denies hypoglycemic s/sx including dizziness, shakiness, sweating. Patient denies hyperglycemic symptoms including polyuria, polydipsia, polyphagia, nocturia, neuropathy, blurred vision.  Current meal patterns: Sometimes only eats 1 meal per day. Yesterday reports that he had fried fish for dinner, no other meals.  - Drinks: water and diet soda. Denies intake of juice or regular soda. Reports he was previously drinking Pepsi and adding sugar to his coffee.  Current physical activity: Yard work, walking  Current medication access support: [employer] Dual Complete   Heart Failure (EF 40-45% in Aug 2024, improved from 20-25% in 2022):  Current medications:  ACEi/ARB/ARNI: Entresto  49/51 mg BID  SGLT2i: Jardiance  25 mg daily (not currently taking) Beta blocker: carvedilol  6.25 mg BID  Mineralocorticoid Receptor Antagonist: spironolactone  25 mg daily  Hydral/nitrate: hydralazine  50 mg TID + isosorbide  mononitrate 30 mg daily Diuretic regimen: torsemide  20 mg daily    Current home blood pressure readings: Reports BP is < 130/80 mmHg at home  Patient denies volume overload signs or symptoms including shortness of breath, lower extremity edema, increased use of pillows at night   Objective:  BP Readings from Last 3 Encounters:  05/01/24 122/88  04/10/24 106/78  04/06/24 108/76    Lab Results  Component Value Date   HGBA1C 11.4  (A) 05/01/2024   HGBA1C 7.0 (A) 01/20/2024   HGBA1C 6.4 (A) 06/01/2023       Latest Ref Rng & Units 04/10/2024   11:13 AM 04/06/2024    8:56 AM 04/05/2024    4:25 PM  BMP  Glucose 70 - 99 mg/dL 812  554  CANCELED   BUN 8 - 27 mg/dL 25  35  36   Creatinine 0.76 - 1.27 mg/dL 7.82  7.69  7.63   BUN/Creat Ratio 10 - 24 12  15  15    Sodium 134 - 144 mmol/L 136  130  127   Potassium 3.5 - 5.2 mmol/L 4.9  4.6  CANCELED   Chloride 96 - 106 mmol/L 101  94  90   CO2 20 - 29 mmol/L 19  22  20    Calcium  8.6 - 10.2 mg/dL 9.7  9.8  9.6     Lab Results  Component Value Date   CHOL 90 04/14/2023   HDL 20 (L) 04/14/2023   LDLCALC 41 04/14/2023   LDLDIRECT 73 08/20/2022   TRIG 146 04/14/2023   CHOLHDL 4.5 04/14/2023    Medications Reviewed Today     Reviewed by Brinda Lorain SQUIBB, RPH (Pharmacist) on 05/01/24 at 1043  Med List Status: <None>   Medication Order Taking? Sig Documenting Provider  Last Dose Status Informant  apixaban  (ELIQUIS ) 5 MG TABS tablet [id number] Yes Take 1 tablet (5 mg total) by mouth 2 (two) times daily. Nooruddin, Saad, MD  Active   atorvastatin  (LIPITOR ) 80 MG tablet [id number]  Take 1 tablet (80 mg total) by mouth daily. Nooruddin, Saad, MD  Active   Blood Glucose Monitoring Suppl (ACCU-CHEK GUIDE) w/Device KIT [id number]  Use as directed twice daily.   Active   carvedilol  (COREG ) 6.25 MG tablet [id number] Yes Take 1 tablet (6.25 mg total) by mouth 2 (two) times daily. Rolan Ezra RAMAN, MD  Active   Continuous Glucose Sensor (FREESTYLE LIBRE 3 PLUS SENSOR) [place] [id number]  Change sensor every 15 days. Charmayne Holmes, DO  Active   empagliflozin  (JARDIANCE ) 25 MG TABS tablet [id number]  Take 1 tablet (25 mg total) by mouth daily before breakfast. Nooruddin, Saad, MD  Active   glucose blood (ACCU-CHEK GUIDE TEST) test strip [id number]  Use three times daily to check blood sugars Elnora Ip, MD  Active   hydrALAZINE  (APRESOLINE ) 50 MG tablet [id number] Yes Take 1 tablet (50  mg total) by mouth 3 (three) times daily. Rolan Ezra RAMAN, MD  Active   icosapent  Ethyl (VASCEPA ) 1 g capsule [id number]  Take 2 capsules (2 g total) by mouth 2 (two) times daily.  Patient taking differently: Take 2 capsules (2 g total) by mouth 2 (two) times daily.   Rolan Ezra RAMAN, MD  Active    Patient not taking:   Discontinued 05/01/24 1042 (Change in therapy)    Patient not taking:   Discontinued 05/01/24 1042 (Change in therapy)   Insulin  Pen Needle (UNIFINE PENTIPS) 32G X 4 MM MISC [id number]  Use daily with insulin  Rosan [place] BROCKS, DO  Active   isosorbide  mononitrate (IMDUR ) 30 MG 24 hr tablet [id number] Yes Take 1 tablet (30 mg total) by mouth daily. Nooruddin, Saad, MD  Active   Lancets Micro Thin 33G MISC [id number]  Use to check blood sugar twice daily Rosan [place] BROCKS, DO  Active   levETIRAcetam  (KEPPRA ) 250 MG tablet [id number]  Take 1 tablet (250 mg total) by mouth 2 (two) times daily.  Patient not taking: Reported on 05/01/2024   Fernand Prost, MD  Active     Discontinued 09/13/20 1454  Patient not taking:   Discontinued 05/01/24 1043 (Change in therapy)    Patient taking differently:   Discontinued 05/01/24 0909 (No longer needed (for PRN medications))   sacubitril -valsartan  (ENTRESTO ) 49-51 MG [id number] Yes Take 1 tablet by mouth 2 (two) times daily. Marylu Gee, DO  Active   Semaglutide ,0.25 or 0.5MG /DOS, 2 MG/3ML NELMA [id number]  Inject 0.5 mg into the skin once a week.  Patient not taking: Reported on 05/01/2024   Charmayne Holmes, DO  Active   spironolactone  (ALDACTONE ) 25 MG tablet [id number] Yes Take 1 tablet (25 mg total) by mouth daily. Nooruddin, Saad, MD  Active   torsemide  (DEMADEX ) 20 MG tablet [id number] Yes Take 1 tablet (20 mg total) by mouth daily. [place], Harlene HERO, [place]  Active               Assessment/Plan:   Diabetes: - Currently controlled with current TIR of 96% over the past week above goal > 70%, in the setting of patient making diet/lifestyle  changes to control BG and discontinuing maintenance medications including Lantus , Ozempic , metformin , and Jardiance . A1C today was 11.4%, which correlates with acutely worsened control over the past several months - however, patient appears to be stable at this  time (GMI is 6.4% today). Advised patient that with these rapid changes in glycemic control, we need to continue closely monitoring his BG via CGM and he should call us  if BG begin to be out of range. Instructed him to resume Jardiance  now as this will help stabilize BG in addition to protecting his heart function and kidneys. Ok with discontinuing metformin  with eGFR close to 30 mL/min. Discussed cardiac and renal benefit associated with Ozempic . Patient does not want to restart at this time, but would recommend adding this back first prior to restarting insulin .  - Last UACR July 2025: 389 mg/dL. Patient is appropriately treated with SGLT2i - Reviewed long term cardiovascular and renal outcomes of uncontrolled blood sugar - Reviewed goal A1c, goal fasting, and goal 2 hour post prandial glucose - Reviewed hypoglycemia management plan and the rule of 15 - Reviewed dietary modifications including  utilizing the healthy plate method, limiting portion size of carbohydrate foods, increasing intake of protein and non-starchy vegetables. Encouraged him to eat 3 meals per day. Counseled patient to stay hydrated with water throughout the day. - Reviewed lifestyle modifications including: aiming for 150 minutes of moderate intensity exercise every week.  - Recommend to resume Jardiance  25 mg daily - Recommend to stop metformin  - Recommend to continue holding Ozempic  and Lantus . Recommend to resume Ozempic  first if patient begins to have BG that are frequently out of range. - Recommend to check glucose continuously using FL3+ CGM - patient is doing very well with self monitoring. - Next A1C due 07/23/23    Heart Failure: - Currently appropriately managed,  though patient's adherence appears suboptimal. BP slightly above goal < 130/80 mmHg today, though it is typically well controlled. Advised him that he is due for Adv HF f/u and repeat echo. Assisted patient in obtaining refills for HF GDMT today. Last BMP was stable with Scr slightly improved and potassium WNL. - Reviewed appropriate blood pressure monitoring technique and reviewed goal blood pressure - Reviewed to weigh daily and when to contact cardiology with weight gain - Recommend to continue current regimen:   Current medications:  ACEi/ARB/ARNI: Entresto  49/51 mg BID SGLT2i: Jardiance  25 mg daily - instructed to resume Beta blocker: carvedilol  6.25 mg BID  Mineralocorticoid Receptor Antagonist: spironolactone  25 mg daily Hydral/nitrate: hydralazine  50 mg TID, isosorbide  mononitrate 30 mg adily Diuretic regimen: torsemide  20 mg daily     Medication Management: - Currently strategy insufficient to maintain appropriate adherence to prescribed medication regimen. Patient exhibits difficulty managing his medications. Could consider pill packs in the future, once patient is on a stable regimen. - Patient reports that he was instructed to stop levetiracetam  (Keppra ) and has not filled since Sept 2024, though he never followed up with neurology to confirm this. Will discuss with PCP to decide whether to remove this from medication list.  - Due for repeat lipid panel at PCP f/u, if LDL-C > 55 mg/dL should restart ezetimibe . Assisted patient in obtaining refill of atorvastatin  today. - Patient appears to be intermittently taking Vascepa . He has an indication to reduce CV risk with hx of TG > 150 mg/dL, but could consider discontinuing to manage pill burden.  - Will continue to follow for medication management   Written patient instructions provided. Patient verbalized understanding of treatment plan.    Follow Up Plan:  Pharmacist 06/19/24 - will follow-up CGM report in 2 weeks PCP clinic  visit 05/22/24   Lorain Baseman, PharmD [facility] [phone]

## 2024-05-01 NOTE — Patient Instructions (Addendum)
 It was nice to see you today!  Your goal blood sugar is 80-130 before eating and less than 180 after eating.   Medication Changes:  RE-START Jardiance  25 mg daily for your heart and kidneys. This medication will also help keep your blood sugar regulated.  If your blood sugar starts to increase again, please RE-START Ozempic  0.25 mg injected once weekly.  Continue Eliquis  5 mg twice daily. Do not take aspirin  with this medication, as it increases your risk of bleeding  For blood pressure - continue carvedilol  6.25 mg twice daily, hydralazine  50 mg three times daily, isosorbide  30 mg once daily, Entresto  49-51 mg twice daily, spironolactone  25 mg daily.  Continue all other medication the same.   Call and lets us  know if you have any questions or concerns.  Keep up the good work with diet and exercise. Aim for a diet full of vegetables, fruit and lean meats (chicken, [place], fish). Try to limit salt intake by eating fresh or frozen vegetables (instead of canned), rinse canned vegetables prior to cooking and do not add any additional salt to meals.   Lorain Baseman, PharmD [facility] [phone]

## 2024-05-05 ENCOUNTER — Other Ambulatory Visit: Payer: Self-pay | Admitting: Urology

## 2024-05-08 ENCOUNTER — Telehealth: Payer: Self-pay | Admitting: *Deleted

## 2024-05-08 NOTE — Telephone Encounter (Signed)
 Copied from CRM #[id number]. Topic: General - Inquiry >> May 08, 2024 10:09 AM Farrel B wrote: [facility] called this morning from [phone] in regard to a fax that was sent on Friday after 1pm she stated the patient is getting prepared for surgery and is needing clearance. Please call and advise she also stated that a fax would be resent as of 10:10am

## 2024-05-09 NOTE — Telephone Encounter (Signed)
 Returned phone call to [facility] to let the office know that I did received the medical clearance form, but no answer. The form is in the doctor box, waiting for the doctor to complete. I left a message for Rodney Schultz to call he back.

## 2024-05-09 NOTE — Telephone Encounter (Signed)
 Return call to [facility] - placed on hold x 7 minutes w/o talking to anyone. Hme stated the clearance form is in the doctor's box for completion.

## 2024-05-09 NOTE — Telephone Encounter (Unsigned)
 Copied from CRM #[id number]. Topic: General - Inquiry >> May 08, 2024 10:09 AM Farrel B wrote: [facility] called this morning from [phone] in regard to a fax that was sent on Friday after 1pm she stated the patient is getting prepared for surgery and is needing clearance. Please call and advise she also stated that a fax would be resent as of 10:10am >> May 09, 2024 12:10 PM Susanna ORN wrote: Arland, with [facility], calling to check if the surgical clearance form has been received. Nothing is listed in chart as of now. Advised her to give a call back once staff returns from lunch.

## 2024-05-16 NOTE — Addendum Note (Signed)
 Addended by: ANTONE DWAYNE SAILOR on: 05/16/2024 12:35 PM   Modules accepted: Orders

## 2024-05-22 ENCOUNTER — Ambulatory Visit: Admitting: Student

## 2024-05-22 ENCOUNTER — Encounter: Payer: Self-pay | Admitting: Student

## 2024-05-22 VITALS — BP 159/99 | HR 54 | Temp 98.0°F | Ht 70.0 in | Wt 189.6 lb

## 2024-05-22 DIAGNOSIS — N448 Other noninflammatory disorders of the testis: Secondary | ICD-10-CM

## 2024-05-22 DIAGNOSIS — Z87891 Personal history of nicotine dependence: Secondary | ICD-10-CM | POA: Diagnosis not present

## 2024-05-22 DIAGNOSIS — I700 Atherosclerosis of aorta: Secondary | ICD-10-CM | POA: Diagnosis not present

## 2024-05-22 DIAGNOSIS — Z01818 Encounter for other preprocedural examination: Secondary | ICD-10-CM | POA: Diagnosis not present

## 2024-05-22 DIAGNOSIS — Z7984 Long term (current) use of oral hypoglycemic drugs: Secondary | ICD-10-CM

## 2024-05-22 DIAGNOSIS — I110 Hypertensive heart disease with heart failure: Secondary | ICD-10-CM | POA: Diagnosis not present

## 2024-05-22 DIAGNOSIS — I10 Essential (primary) hypertension: Secondary | ICD-10-CM

## 2024-05-22 DIAGNOSIS — E118 Type 2 diabetes mellitus with unspecified complications: Secondary | ICD-10-CM | POA: Diagnosis not present

## 2024-05-22 DIAGNOSIS — I5022 Chronic systolic (congestive) heart failure: Secondary | ICD-10-CM | POA: Diagnosis not present

## 2024-05-22 DIAGNOSIS — Z79899 Other long term (current) drug therapy: Secondary | ICD-10-CM

## 2024-05-22 NOTE — Assessment & Plan Note (Signed)
 Patient reports quitting smoking 8 months ago.

## 2024-05-22 NOTE — Assessment & Plan Note (Signed)
 Patient measures glucose levels with Freestyle Libre. Patient has stopped taking Lantus  about 2-3 months ago due to blood sugars dropping too low (60s), and has not taken ozempic  or metformin  in about a month. Patient reports glucose levels being well controlled, around 99 while fasting, and 150 postprandial. Did not bring in [place] this visit. Last A1c 11.4. Patient denies hypoglycemic symptoms such as lightheadedness or dizziness, and denies hyperglycemic symptoms such as polydipsia, polyuria, SOB since stopping diabetes meds. Discussed return precautions with patient.   Plan:  -continue taking Jardiance  25 mg daily, will hold on Lantus , Ozempic , and metformin  till next visit  -follow up visit in 2 months to discuss diabetes management  -will bring Libre in to assess recent glucose levels

## 2024-05-22 NOTE — Assessment & Plan Note (Signed)
 Patient has not noticed any SOB or lower extremity swelling. Cardiopulmonary exam unremarkable. Continue Entresto  49-51 mg twice daily, Jardiance  25 mg daily, carvedilol  6.25 mg twice daily, spironolactone  25 mg daily, hydralazine  50 mg three times daily, Imdur  30 mg once daily, and torsemide  20 mg daily.

## 2024-05-22 NOTE — Patient Instructions (Signed)
 Thank you so much for coming to the clinic today!   Glad you're doing well! Please bring in your sugar monitor at your next visit  We will fax over the paperwork to the Urology office, and remember to hold the eliquis  two days before the procedure (12/1).  If you have any questions please feel free to the call the clinic at anytime at [phone]. It was a pleasure seeing you!  Best, Dr. Lejon Afzal

## 2024-05-22 NOTE — Assessment & Plan Note (Signed)
 Needs lipid panel check for next visit.

## 2024-05-22 NOTE — Progress Notes (Signed)
 This is a Psychologist, Occupational Note.  The care of the patient was discussed with Dr. Nooruddin and the assessment and plan was formulated with their assistance.  Please see their note for official documentation of the patient encounter.   Subjective:   Patient ID: Rodney Schultz male   DOB: 06/06/1961 63 y.o.   MRN: [id number]  HPI: [name] is a 63 y.o. male with pmh of T2DM, HTN, HfrEF, Afib on Eliquis , and aortic atherosclerosis who presents to the clinic today for medical clearance and risk assessment for upcoming right radical orchiectomy. Patient endorses no complaints today. Reports sugars have been around 99 fasting, and 150 after eating. Patient states that he Is no longer taking Lantus , metformin , or ozempic ; is only taking Jardiance  and managing diabetes through increased activity and diet. Reports taking blood pressures 2-3 times at home and is around 108/69. Patient reports still taking all of his HfrEF medications (Enestro, Jardiance , Coreg , Spironolactone , Torsemide ). Patient denies Patient denies hypoglycemic symptoms such as lightheadedness or dizziness, and denies hyperglycemic symptoms such as polydipsia, polyuria, SOB. Patient denies hypoglycemic symptoms such as lightheadedness or dizziness, and denies hyperglycemic symptoms such as polydipsia, polyuria, SOB. Patient denies hypoglycemic symptoms such as lightheadedness or dizziness, and denies hyperglycemic symptoms such as polydipsia, polyuria, SOB. Patient denies hypoglycemic symptoms such as lightheadedness or dizziness, and denies hyperglycemic symptoms such as polydipsia, polyuria, SOB. Patient denies hypoglycemic symptoms such as lightheadedness or dizziness, and denies hyperglycemic symptoms such as polydipsia, polyuria, SOB.  Denies constipation or diarrhea.     Past Medical History:  Diagnosis Date   Diabetes mellitus without complication (HCC)    GERD (gastroesophageal reflux disease)    Hypertension    Current  Outpatient Medications  Medication Sig Dispense Refill   apixaban  (ELIQUIS ) 5 MG TABS tablet Take 1 tablet (5 mg total) by mouth 2 (two) times daily. 60 tablet 2   atorvastatin  (LIPITOR ) 80 MG tablet Take 1 tablet (80 mg total) by mouth daily. 90 tablet 3   Blood Glucose Monitoring Suppl (ACCU-CHEK GUIDE) w/Device KIT Use as directed twice daily. 1 kit 0   carvedilol  (COREG ) 6.25 MG tablet Take 1 tablet (6.25 mg total) by mouth 2 (two) times daily. 180 tablet 3   Continuous Glucose Sensor (FREESTYLE LIBRE 3 PLUS SENSOR) MISC Change sensor every 15 days. 6 each 3   empagliflozin  (JARDIANCE ) 25 MG TABS tablet Take 1 tablet (25 mg total) by mouth daily before breakfast. 90 tablet 2   glucose blood (ACCU-CHEK GUIDE TEST) test strip Use three times daily to check blood sugars 100 each 12   hydrALAZINE  (APRESOLINE ) 50 MG tablet Take 1 tablet (50 mg total) by mouth 3 (three) times daily. 90 tablet 3   icosapent  Ethyl (VASCEPA ) 1 g capsule Take 2 capsules (2 g total) by mouth 2 (two) times daily. (Patient taking differently: Take 2 capsules (2 g total) by mouth 2 (two) times daily.) 120 capsule 6   Insulin  Pen Needle (UNIFINE PENTIPS) 32G X 4 MM MISC Use daily with insulin  100 each 2   isosorbide  mononitrate (IMDUR ) 30 MG 24 hr tablet Take 1 tablet (30 mg total) by mouth daily. 90 tablet 3   Lancets Micro Thin 33G MISC Use to check blood sugar twice daily 100 each 1   levETIRAcetam  (KEPPRA ) 250 MG tablet Take 1 tablet (250 mg total) by mouth 2 (two) times daily. (Patient not taking: Reported on 05/01/2024) 60 tablet 2   sacubitril -valsartan  (ENTRESTO ) 49-51 MG Take 1  tablet by mouth 2 (two) times daily. 60 tablet 2   Semaglutide ,0.25 or 0.5MG /DOS, 2 MG/3ML SOPN Inject 0.5 mg into the skin once a week. (Patient not taking: Reported on 05/01/2024) 3 mL 3   spironolactone  (ALDACTONE ) 25 MG tablet Take 1 tablet (25 mg total) by mouth daily. 90 tablet 3   torsemide  (DEMADEX ) 20 MG tablet Take 1 tablet (20 mg  total) by mouth daily. 30 tablet 5   No current facility-administered medications for this visit.   Family History  Problem Relation Age of Onset   Healthy Mother    Social History   Socioeconomic History   Marital status: Divorced    Spouse name: Not on file   Number of children: 4   Years of education: Not on file   Highest education level: 11th grade  Occupational History   Occupation: orthoptist yard    Comment: operates forklift, holiday representative yard dog  Tobacco Use   Smoking status: Some Days    Current packs/day: 0.10    Average packs/day: 0.1 packs/day for 36.3 years (3.6 ttl pk-yrs)    Types: Cigarettes    Start date: 09/02/2021   Smokeless tobacco: Never   Tobacco comments:    Once in a blue moon      Vaping Use   Vaping status: Never Used  Substance and Sexual Activity   Alcohol use: Not Currently   Drug use: Never   Sexual activity: Not on file  Other Topics Concern   Not on file  Social History Narrative   Not on file   Social Drivers of Health   Financial Resource Strain: Low Risk  (01/20/2024)   Overall Financial Resource Strain (CARDIA)    Difficulty of Paying Living Expenses: Not very hard  Food Insecurity: No Food Insecurity (01/20/2024)   Hunger Vital Sign    Worried About Running Out of Food in the Last Year: Never true    Ran Out of Food in the Last Year: Never true  Transportation Needs: No Transportation Needs (01/20/2024)   PRAPARE - Administrator, Civil Service (Medical): No    Lack of Transportation (Non-Medical): No  Physical Activity: Unknown (01/20/2024)   Exercise Vital Sign    Days of Exercise per Week: 4 days    Minutes of Exercise per Session: Not on file  Stress: No Stress Concern Present (01/20/2024)   [employer] of Occupational Health - Occupational Stress Questionnaire    Feeling of Stress: Not at all  Social Connections: Moderately Isolated (01/20/2024)   Social Connection and Isolation Panel    Frequency of  Communication with Friends and Family: More than three times a week    Frequency of Social Gatherings with Friends and Family: More than three times a week    Attends Religious Services: Patient unable to answer    Active Member of Clubs or Organizations: Yes    Attends Banker Meetings: More than 4 times per year    Marital Status: Divorced   Review of Systems: Pertinent items are noted in HPI. Objective:  Physical Exam: Vitals:   05/22/24 0938 05/22/24 1022  BP: (!) 148/97 (!) 159/99  Pulse: (!) 55 (!) 54  Temp: 98 F (36.7 C)   TempSrc: Oral   SpO2: 100%   Weight: 189 lb 9.6 oz (86 kg)   Height: 5' 10 (1.778 m)    BP (!) 159/99 (BP Location: Right Arm, Patient Position: Sitting, Cuff Size: Small) Comment: patient has not take  blood pressure medications this morning  Pulse (!) 54   Temp 98 F (36.7 C) (Oral)   Ht 5' 10 (1.778 m)   Wt 189 lb 9.6 oz (86 kg)   SpO2 100%   BMI 27.20 kg/m   General Appearance:    Alert, cooperative, no distress, appears stated age  Head:    Normocephalic, without obvious abnormality, atraumatic  Lungs:     Clear to auscultation bilaterally, respirations unlabored  Heart:    Regular rate and rhythm, S1 and S2 normal, no murmur, rub   or gallop  Extremities:   Extremities normal, atraumatic, no cyanosis or edema   Assessment & Plan:   Assessment & Plan Type 2 diabetes with complication [facility]) Patient measures glucose levels with Freestyle Libre. Patient has stopped taking Lantus  about 2-3 months ago due to blood sugars dropping too low (60s), and has not taken ozempic  or metformin  in about a month. Patient reports glucose levels being well controlled, around 99 while fasting, and 150 postprandial. Did not bring in [place] this visit. Last A1c 11.4. Patient denies hypoglycemic symptoms such as lightheadedness or dizziness, and denies hyperglycemic symptoms such as polydipsia, polyuria, SOB since stopping diabetes meds. Discussed return  precautions with patient.   Plan:  -continue taking Jardiance  25 mg daily, will hold on Lantus , Ozempic , and metformin  till next visit  -follow up visit in 2 months to discuss diabetes management  -will bring Libre in to assess recent glucose levels     Essential hypertension BP today 148/97, repeat 159/99. Did not take BP meds before visit. Patient reports taking BP 2-3 times a day, and readings are usually around 108/69. Continue carvedilol  6.25 mg twice daily, hydralazine  50 mg three times daily, Imdur  30 mg once daily, Entresto  49-51 mg twice daily, and spironolactone  25 mg daily.     Chronic HFrEF (heart failure with reduced ejection fraction) (HCC) Patient has not noticed any SOB or lower extremity swelling. Cardiopulmonary exam unremarkable. Continue Entresto  49-51 mg twice daily, Jardiance  25 mg daily, carvedilol  6.25 mg twice daily, spironolactone  25 mg daily, hydralazine  50 mg three times daily, Imdur  30 mg once daily, and torsemide  20 mg daily.     Aortic atherosclerosis Needs lipid panel check for next visit.     History of tobacco abuse Patient reports quitting smoking 8 months ago.      Discussed and evaluated with Dr. Nooruddin and discussed plan with Dr. Jeanelle.    Cozetta Pereyra, MS3

## 2024-05-23 NOTE — Progress Notes (Signed)
 Internal Medicine Clinic Attending  Case discussed with the resident at the time of the visit.  We reviewed the resident's history and exam and pertinent patient test results.  I agree with the assessment, diagnosis, and plan of care documented in the resident's note.

## 2024-05-29 NOTE — Telephone Encounter (Signed)
 Message forwarded to [facility]  Copied from CRM [id number]. Topic: General - Other >> May 11, 2024  9:15 AM Alfonso ORN wrote: Reason for CRM: Zona with [facility] fax over   10/17 and 10/20 for a surgical clearance for patient , recieved a call from [facility]  [phone] ext [phone] recieved a call from [facility] >> May 24, 2024  9:01 AM Alfonso ORN wrote: zona calling from [facility] regarding patient  having surgery 06/21/24 , need status of the surgical clearance was requested on  05/05/24   [phone]

## 2024-05-30 NOTE — Telephone Encounter (Signed)
 Returned phone call to [facility] with [facility] regarding medical clearance. Zona states she already received the form from our office. Advised Ms. Candie to call back if she needs further assistance.

## 2024-06-04 NOTE — Patient Instructions (Signed)
 SURGICAL WAITING ROOM VISITATION Patients having surgery or a procedure may have no more than 2 support people in the waiting area - these visitors may rotate.    Children under the age of 49 must have an adult with them who is not the patient.  If the patient needs to stay at the hospital during part of their recovery, the visitor guidelines for inpatient rooms apply. Pre-op nurse will coordinate an appropriate time for 1 support person to accompany patient in pre-op.  This support person may not rotate.    Please refer to the [facility] website for the visitor guidelines for Inpatients (after your surgery is over and you are in a regular room).       Your procedure is scheduled on: 06-21-24   Report to [facility] Main Entrance    Report to admitting at 6:15 AM   Call this number if you have problems the morning of surgery [phone]   Do not eat food or drink liquids :After Midnight.          If you have questions, please contact your surgeon's office.   FOLLOW  ANY ADDITIONAL PRE OP INSTRUCTIONS YOU RECEIVED FROM YOUR SURGEON'S OFFICE!!!     Oral Hygiene is also important to reduce your risk of infection.                                    Remember - BRUSH YOUR TEETH THE MORNING OF SURGERY WITH YOUR REGULAR TOOTHPASTE   Do NOT smoke after Midnight   Take these medicines the morning of surgery with A SIP OF WATER:    Atorvastatin    Carvedilol    Hydralazine    Isosorbide     Stop all vitamins and herbal supplements 7 days before surgery  How to Manage Your Diabetes Before and After Surgery  Why is it important to control my blood sugar before and after surgery? Improving blood sugar levels before and after surgery helps healing and can limit problems. A way of improving blood sugar control is eating a healthy diet by:  Eating less sugar and carbohydrates  Increasing activity/exercise  Talking with your doctor about reaching your blood sugar goals High blood  sugars (greater than 180 mg/dL) can raise your risk of infections and slow your recovery, so you will need to focus on controlling your diabetes during the weeks before surgery. Make sure that the doctor who takes care of your diabetes knows about your planned surgery including the date and location.  How do I manage my blood sugar before surgery? Check your blood sugar at least 4 times a day, starting 2 days before surgery, to make sure that the level is not too high or low. Check your blood sugar the morning of your surgery when you wake up and every 2 hours until you get to the Short Stay unit. If your blood sugar is less than 70 mg/dL, you will need to treat for low blood sugar: Do not take insulin . Treat a low blood sugar (less than 70 mg/dL) with  cup of clear juice (cranberry or apple), 4 glucose tablets, OR glucose gel. Recheck blood sugar in 15 minutes after treatment (to make sure it is greater than 70 mg/dL). If your blood sugar is not greater than 70 mg/dL on recheck, call [phone] for further instructions. Report your blood sugar to the short stay nurse when you get to Short Stay.  If you are admitted to the hospital after surgery: Your blood sugar will be checked by the staff and you will probably be given insulin  after surgery (instead of oral diabetes medicines) to make sure you have good blood sugar levels. The goal for blood sugar control after surgery is 80-180 mg/dL.   WHAT DO I DO ABOUT MY DIABETES MEDICATION?  Do not take oral diabetes medicines (pills) the morning of surgery.  Hold Jardiance  3 days before surgery (do not take after 06-17-24)  DO NOT TAKE THE FOLLOWING 7 DAYS PRIOR TO SURGERY: Ozempic , Wegovy , Rybelsus  (Semaglutide ), Byetta (exenatide), Bydureon (exenatide ER), Victoza, Saxenda (liraglutide), or Trulicity (dulaglutide) Mounjaro (Tirzepatide) Adlyxin (Lixisenatide), Polyethylene Glycol Loxenatide.  Reviewed and Endorsed by [facility] Patient  Education Committee, August 2015   Hold Eliquis  2 days before surgery (do not take after 06-18-24)                       You may not have any metal on your body including  jewelry, and body piercing             Do not wear lotions, powders, cologne, or deodorant              Men may shave face and neck.   Do not bring valuables to the hospital. [facility] IS NOT RESPONSIBLE   FOR VALUABLES.   Contacts, dentures or bridgework may not be worn into surgery.  DO NOT BRING YOUR HOME MEDICATIONS TO THE HOSPITAL. PHARMACY WILL DISPENSE MEDICATIONS LISTED ON YOUR MEDICATION LIST TO YOU DURING YOUR ADMISSION IN THE HOSPITAL!    Patients discharged on the day of surgery will not be allowed to drive home.  Someone NEEDS to stay with you for the first 24 hours after anesthesia.               Please read over the following fact sheets you were given: IF YOU HAVE QUESTIONS ABOUT YOUR PRE-OP INSTRUCTIONS PLEASE CALL [phone] Gwen  If you received a COVID test during your pre-op visit  it is requested that you wear a mask when out in public, stay away from anyone that may not be feeling well and notify your surgeon if you develop symptoms. If you test positive for Covid or have been in contact with anyone that has tested positive in the last 10 days please notify you surgeon.  [place] - Preparing for Surgery Before surgery, you can play an important role.  Because skin is not sterile, your skin needs to be as free of germs as possible.  You can reduce the number of germs on your skin by washing with CHG (chlorahexidine gluconate) soap before surgery.  CHG is an antiseptic cleaner which kills germs and bonds with the skin to continue killing germs even after washing. Please DO NOT use if you have an allergy to CHG or antibacterial soaps.  If your skin becomes reddened/irritated stop using the CHG and inform your nurse when you arrive at Short Stay. Do not shave (including legs and underarms) for  at least 48 hours prior to the first CHG shower.  You may shave your face/neck.  Please follow these instructions carefully:  1.  Shower with CHG Soap the night before surgery ONLY (DO NOT USE THE SOAP THE MORNING OF SURGERY).  2.  If you choose to wash your hair, wash your hair first as usual with your normal  shampoo.  3.  After you shampoo, rinse  your hair and body thoroughly to remove the shampoo.                             4.  Use CHG as you would any other liquid soap.  You can apply chg directly to the skin and wash.  Gently with a scrungie or clean washcloth.  5.  Apply the CHG Soap to your body ONLY FROM THE NECK DOWN.   Do   not use on face/ open                           Wound or open sores. Avoid contact with eyes, ears mouth and   genitals (private parts).                       Wash face,  Genitals (private parts) with your normal soap.             6.  Wash thoroughly, paying special attention to the area where your    surgery  will be performed.  7.  Thoroughly rinse your body with warm water from the neck down.  8.  DO NOT shower/wash with your normal soap after using and rinsing off the CHG Soap.                9.  Pat yourself dry with a clean towel.            10.  Wear clean pajamas.            11.  Place clean sheets on your bed the night of your first shower and do not  sleep with pets. Day of Surgery : Do not apply any CHG, lotions/deodorants the morning of surgery.  Please wear clean clothes to the hospital/surgery center.  FAILURE TO FOLLOW THESE INSTRUCTIONS MAY RESULT IN THE CANCELLATION OF YOUR SURGERY  PATIENT SIGNATURE_________________________________  NURSE SIGNATURE__________________________________  ________________________________________________________________________

## 2024-06-04 NOTE — Progress Notes (Signed)
 I did not see this patient for his PST

## 2024-06-05 ENCOUNTER — Encounter (HOSPITAL_COMMUNITY): Payer: Self-pay

## 2024-06-05 ENCOUNTER — Other Ambulatory Visit: Payer: Self-pay

## 2024-06-05 ENCOUNTER — Encounter (HOSPITAL_COMMUNITY)
Admission: RE | Admit: 2024-06-05 | Discharge: 2024-06-05 | Disposition: A | Discharge status: Home / Self Care | Source: Ambulatory Visit | Attending: Orthopedic Surgery | Admitting: Orthopedic Surgery

## 2024-06-05 VITALS — BP 140/92 | HR 60 | Temp 98.3°F | Resp 16 | Ht 70.0 in | Wt 188.8 lb

## 2024-06-05 DIAGNOSIS — Z79899 Other long term (current) drug therapy: Secondary | ICD-10-CM | POA: Insufficient documentation

## 2024-06-05 DIAGNOSIS — I10 Essential (primary) hypertension: Secondary | ICD-10-CM

## 2024-06-05 DIAGNOSIS — I110 Hypertensive heart disease with heart failure: Secondary | ICD-10-CM | POA: Insufficient documentation

## 2024-06-05 DIAGNOSIS — I5022 Chronic systolic (congestive) heart failure: Secondary | ICD-10-CM | POA: Diagnosis not present

## 2024-06-05 DIAGNOSIS — Z01818 Encounter for other preprocedural examination: Secondary | ICD-10-CM

## 2024-06-05 DIAGNOSIS — Z01812 Encounter for preprocedural laboratory examination: Secondary | ICD-10-CM | POA: Insufficient documentation

## 2024-06-05 DIAGNOSIS — E118 Type 2 diabetes mellitus with unspecified complications: Secondary | ICD-10-CM | POA: Insufficient documentation

## 2024-06-05 HISTORY — DX: Heart failure, unspecified: I50.9

## 2024-06-05 HISTORY — DX: Unspecified osteoarthritis, unspecified site: M19.90

## 2024-06-05 LAB — BASIC METABOLIC PANEL WITH GFR
Anion gap: 13 (ref 5–15)
BUN: 41 mg/dL — ABNORMAL HIGH (ref 8–23)
CO2: 23 mmol/L (ref 22–32)
Calcium: 9.8 mg/dL (ref 8.9–10.3)
Chloride: 104 mmol/L (ref 98–111)
Creatinine, Ser: 2.76 mg/dL — ABNORMAL HIGH (ref 0.61–1.24)
GFR, Estimated: 25 mL/min — ABNORMAL LOW (ref >60)
Glucose, Bld: 168 mg/dL — ABNORMAL HIGH (ref 70–99)
Potassium: 4.4 mmol/L (ref 3.5–5.1)
Sodium: 140 mmol/L (ref 135–145)

## 2024-06-05 LAB — CBC
HCT: 40.9 % (ref 39.0–52.0)
Hemoglobin: 13 g/dL (ref 13.0–17.0)
MCH: 27.7 pg (ref 26.0–34.0)
MCHC: 31.8 g/dL (ref 30.0–36.0)
MCV: 87 fL (ref 80.0–100.0)
Platelets: 279 K/uL (ref 150–400)
RBC: 4.7 MIL/uL (ref 4.22–5.81)
RDW: 12.6 % (ref 11.5–15.5)
WBC: 5.7 K/uL (ref 4.0–10.5)
nRBC: 0 % (ref 0.0–0.2)

## 2024-06-05 LAB — HEMOGLOBIN A1C
Hgb A1c MFr Bld: 9 % — ABNORMAL HIGH (ref 4.8–5.6)
Mean Plasma Glucose: 211.6 mg/dL

## 2024-06-05 LAB — GLUCOSE, CAPILLARY: Glucose-Capillary: 143 mg/dL — ABNORMAL HIGH (ref 70–99)

## 2024-06-05 NOTE — Progress Notes (Addendum)
 Date of COVID positive in last 90 days:  No  PCP - Schuyler Novak, DO Cardiologist - Ezra Shuck, MD  Chest x-ray - N/A EKG - 10-26-23 Epic Stress Test - N/A ECHO - 02-23-23 Epic Cardiac Cath - 08-11-21 Epic MR Cardiac - 06-09-22 Epic Pacemaker/ICD device last checked:N/A Spinal Cord Stimulator:N/A  Bowel Prep - N/A  Sleep Study - N/A CPAP -   Freestyle Libre Fasting Blood Sugar - 97 to 115 Checks Blood Sugar - multiple times a day  Last dose of GLP1 agonist-  N/A GLP1 instructions:  Do not take after     Jardiance  Last dose of SGLT-2 inhibitors-  N/A SGLT-2 instructions:  Do not take after 06-17-24   Blood Thinner Instructions: Eliquis  to stop 2 days prior to surgery (patient aware): Aspirin  Instructions:N/A Last Dose:  Activity level:  Can go up a flight of stairs and perform activities of daily living without stopping and without symptoms of chest pain or shortness of breath.  Anesthesia review: CHF, Afib, HTN, DM.  Patient states that shortness of breath resolved after he stopped smoking.  A1c 9.0, creatinine 2.76, BUN 41 on preop labs  Patient denies shortness of breath, fever, cough and chest pain at PAT appointment  Patient verbalized understanding of instructions that were given to them at the PAT appointment. Patient was also instructed that they will need to review over the PAT instructions again at home before surgery.

## 2024-06-07 NOTE — Progress Notes (Signed)
 Anesthesia Chart Review   Case: [id number] Date/Time: 06/21/24 0815   Procedure: ORCHIECTOMY (Right)   Anesthesia type: General   Diagnosis: Testicular pain, right [N50.811]   Pre-op diagnosis: RIGHT TESTICULAR PAIN   Location: [facility] PROCEDURE [facility] / [facility]   Surgeons: Lovie Arlyss CROME, MD       DISCUSSION:63 y.o. former smoker with h/o HTN, DM II (A1C 9.0), a-fib, CHF, right testicular pain scheduled for above procedure 06/21/2024 with Dr. Arlyss Lovie.   A1C has improved, now 9.0 from 11.4 in October.   Pt last seen by cardiology 10/26/2023. BP controlled. Echo 8/24 showed EF 40-45%. NYHA class I-II, mildly volume up on exam and ReDs 39%.  LHC (1/23) with diffuse disease occluded large ramus and moderate D1. 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 No good PCI options in setting of diffuse disease and no chest pain, and he does not have good targets for CABG (would be high risk CABG with low EF). Continue medical management. No chest pain.   Pt reports to PAT nurse he can climb a flight of stairs without chest pain or shortness of breath.   Pt seen by PCP 05/22/2024 for preoperative evaluation.  Per OV note, Mr. Macomber has been overall doing well.  Has stopped taking his insulin  as well as his metformin  and Ozempic , and fasting sugar appears to be in the 99 range.  Unfortunately, he did not bring in his glucometer at this visit, but there is documented evidence of his glucose being well-controlled.  He has made changes to his diet, and is exercising more which could be the reason for the stark change.  Will follow-up with this at his next visit.   His RCRI score is 10%, and per NSQIP risk calculator his average risk is slightly above average at 3.2% for any complication.  Pharmacy advised pt to hold Eliquis  2 days prior to procedure.  VS: There were no vitals taken for this visit.  PROVIDERS: [name], DO is PCP   Cardiologist - Ezra Shuck, MD  LABS:  Labs reviewed: Acceptable for surgery. (all labs ordered are listed, but only abnormal results are displayed)  Labs Reviewed - No data to display   IMAGES:   EKG:   CV: Echo 02/23/2023  1. Left ventricular ejection fraction, by estimation, is 40 to 45%. The  left ventricle has mildly decreased function. The left ventricle  demonstrates regional wall motion abnormalities (see scoring  diagram/findings for description). Left ventricular  diastolic parameters are consistent with Grade I diastolic dysfunction  (impaired relaxation). There is global hypokinesis. Cannot rule out severe  hypokinesis to akinesis of the left ventricular, apical segment due to  poor endocardial definition. The  average left ventricular global longitudinal strain is -10.4 %. The global  longitudinal strain is abnormal.   2. Right ventricular systolic function is mildly reduced. The right  ventricular size is normal.   3. The mitral valve is normal in structure. Mild mitral valve  regurgitation. No evidence of mitral stenosis.   4. The aortic valve is tricuspid. Aortic valve regurgitation is not  visualized. Aortic valve sclerosis/calcification is present, without any  evidence of aortic stenosis.   5. The inferior vena cava is normal in size with greater than 50%  respiratory variability, suggesting right atrial pressure of 3 mmHg.   6. Ascending aorta measurements are within normal limits for age when  indexed to body surface area.   7. Recommend limited echo with definity  contrast.  Past Medical History:  Diagnosis Date   Arthritis    CHF (congestive heart failure) (HCC)    Diabetes mellitus without complication (HCC)    GERD (gastroesophageal reflux disease)    Hypertension     Past Surgical History:  Procedure Laterality Date   RIGHT HEART CATH N/A 08/11/2021   Procedure: RIGHT HEART CATH;  Surgeon: Rolan Ezra RAMAN, MD;  Location: [facility] INVASIVE CV LAB;  Service: Cardiovascular;  Laterality: N/A;    testical  Right     MEDICATIONS: No current facility-administered medications for this encounter.    apixaban  (ELIQUIS ) 5 MG TABS tablet   atorvastatin  (LIPITOR ) 80 MG tablet   carvedilol  (COREG ) 6.25 MG tablet   empagliflozin  (JARDIANCE ) 25 MG TABS tablet   hydrALAZINE  (APRESOLINE ) 50 MG tablet   isosorbide  mononitrate (IMDUR ) 30 MG 24 hr tablet   Multiple Vitamins-Minerals (CENTRUM SILVER ADULT 50+) TABS   sacubitril -valsartan  (ENTRESTO ) 49-51 MG   spironolactone  (ALDACTONE ) 25 MG tablet   torsemide  (DEMADEX ) 20 MG tablet   Blood Glucose Monitoring Suppl (ACCU-CHEK GUIDE) w/Device KIT   Continuous Glucose Sensor (FREESTYLE LIBRE 3 PLUS SENSOR) MISC   glucose blood (ACCU-CHEK GUIDE TEST) test strip   icosapent  Ethyl (VASCEPA ) 1 g capsule   Insulin  Pen Needle (UNIFINE PENTIPS) 32G X 4 MM MISC   Lancets Micro Thin 33G MISC   levETIRAcetam  (KEPPRA ) 250 MG tablet     [facility], PA-C [facility] Pre-Surgical Testing [phone]

## 2024-06-19 ENCOUNTER — Ambulatory Visit

## 2024-06-19 ENCOUNTER — Other Ambulatory Visit (HOSPITAL_COMMUNITY): Payer: Self-pay

## 2024-06-19 VITALS — BP 114/80 | HR 57 | Wt 188.2 lb

## 2024-06-19 DIAGNOSIS — I5022 Chronic systolic (congestive) heart failure: Secondary | ICD-10-CM

## 2024-06-19 DIAGNOSIS — E118 Type 2 diabetes mellitus with unspecified complications: Secondary | ICD-10-CM

## 2024-06-19 DIAGNOSIS — Z7984 Long term (current) use of oral hypoglycemic drugs: Secondary | ICD-10-CM

## 2024-06-19 DIAGNOSIS — Z87891 Personal history of nicotine dependence: Secondary | ICD-10-CM

## 2024-06-19 DIAGNOSIS — Z79899 Other long term (current) drug therapy: Secondary | ICD-10-CM

## 2024-06-19 DIAGNOSIS — E119 Type 2 diabetes mellitus without complications: Secondary | ICD-10-CM

## 2024-06-19 NOTE — Progress Notes (Signed)
 06/19/2024 Name: Rodney Schultz MRN: [id number] DOB: 1961/05/16  Chief Complaint  Patient presents with   Diabetes   Congestive Heart Failure    Rodney Schultz is a 63 y.o. year old male who was referred for medication management by their primary care provider, [name], DO. They presented for a face to face visit today.   They were referred to the pharmacist by their PCP for assistance in managing diabetes . PMH includes HTN, HFrEF (EF initially 20-25% in Dec 2022, improved to 40-45% in August 2024 with mildly reduced RV), T2DM (HHS in Jan 2024), CKD3b, HLD, hx of tobacco use, CVA (2024), seizure (2024, - same admission as CVA), CAD (R/LHC in 2023 with diffuse disease, medically managed), Afib.   Subjective: Patient was last seen by PCP, Dr. [name] on 05/22/24. BP was elevated, but patient reports he had not taken his BP medications that morning. He continued to hold his diabetes medications. Patient has upcoming R radial orchiectomy. Previously patient had brought medications with him to pharmacy clinic, and had current supplies of most of his medications, despite being overdue for refills, indicating intermittent adherence.  Today, patient presents in good spirits without assistance. He continues to only take his heart and BP medications. He states he does not have any interest in restarting Ozempic  at this time. He had FL3+ CGM on for the past week,  but reports his app accidentally got deleted and he needs assistance resetting. He reports he took his BP medications around 7AM.   Care Team: Primary Care Provider: [name], DO ; Next Scheduled Visit: needs to be scheduled ~Jan 2026   Medication Access/Adherence  Current Pharmacy:  [facility] [address], [address] [place] [address] Phone: [phone] Fax: [phone]  [facility] - [facility] [address] [place] [address] Phone:  [phone] Fax: [phone]   Patient reports affordability concerns with their medications: No  Patient reports access/transportation concerns to their pharmacy: No  - Using [facility] pharmacy Patient reports adherence concerns with their medications:  Yes  - brought meds with him on 04/10/24 - patient reported he was taking all meds, but last fill dates were several months prior.   Will assist in filling Jardiance , torsemide , and test strips as requested by patient today.  He reported that he uses a pill box to organize his medications, but instead of filling the pill box with the medications he needs to take each day, he pours the contents of his bottle into one slot (ex: puts hydralazine  in the Saturday slot, etc - then goes down the line and takes each pill). Reports that he lines the medication bottles that don't fit next to it to take. Previously reported that he can remember which pills he needs to take twice daily.   Diabetes:  Current medications:  Jardiance  25 mg daily  Medications tried in the past: glipizide , Novolog   Current glucose readings: Freestyle Libre 3+       Observed patterns: Patient has been able to maintain glycemic control without insulin .   Patient denies hypoglycemic s/sx including dizziness, shakiness, sweating. Patient denies hyperglycemic symptoms including polyuria, polydipsia, polyphagia, nocturia, neuropathy, blurred vision.  Current meal patterns: Sometimes only eats 1 meal per day. Yesterday reports that he had fried fish for dinner, no other meals.  - Drinks: water and diet soda. Denies intake of juice or regular soda. Reports he was previously drinking Pepsi and adding sugar to his  coffee. - Snacking: 4-5 grapes  Current physical activity: Yard work, walking  Current medication access support: [employer] Dual Complete   Heart Failure (EF 40-45% in Aug 2024, improved from 20-25% in 2022):  Current medications:  ACEi/ARB/ARNI: Entresto  49/51 mg BID  (confirms taking BID) SGLT2i: Jardiance  25 mg daily (states he needs a refill) Beta blocker: carvedilol  6.25 mg BID  Mineralocorticoid Receptor Antagonist: spironolactone  25 mg daily  Hydral/nitrate: hydralazine  50 mg TID + isosorbide  mononitrate 30 mg daily Diuretic regimen: torsemide  20 mg daily (states he needs a refill)   Current home blood pressure readings: Reports BP is < 130/80 mmHg at home, however recalls reading of 125/88 mmHg on Monday.  Patient denies volume overload signs or symptoms including shortness of breath, lower extremity edema, increased use of pillows at night   Objective:  BP Readings from Last 3 Encounters:  06/19/24 114/80  06/05/24 (!) 140/92  05/22/24 (!) 159/99    Lab Results  Component Value Date   HGBA1C 9.0 (H) 06/05/2024   HGBA1C 11.4 (A) 05/01/2024   HGBA1C 7.0 (A) 01/20/2024       Latest Ref Rng & Units 06/05/2024    8:30 AM 04/10/2024   11:13 AM 04/06/2024    8:56 AM  BMP  Glucose 70 - 99 mg/dL 831  812  554   BUN 8 - 23 mg/dL 41  25  35   Creatinine 0.61 - 1.24 mg/dL 7.23  7.82  7.69   BUN/Creat Ratio 10 - 24  12  15    Sodium 135 - 145 mmol/L 140  136  130   Potassium 3.5 - 5.1 mmol/L 4.4  4.9  4.6   Chloride 98 - 111 mmol/L 104  101  94   CO2 22 - 32 mmol/L 23  19  22    Calcium  8.9 - 10.3 mg/dL 9.8  9.7  9.8     Lab Results  Component Value Date   CHOL 90 04/14/2023   HDL 20 (L) 04/14/2023   LDLCALC 41 04/14/2023   LDLDIRECT 73 08/20/2022   TRIG 146 04/14/2023   CHOLHDL 4.5 04/14/2023    Medications Reviewed Today     Reviewed by Rodney Schultz, RPH (Pharmacist) on 06/19/24 at [phone]  Med List Status: <None>   Medication Order Taking? Sig Documenting Provider Last Dose Status Informant  apixaban  (ELIQUIS ) 5 MG TABS tablet [id number] Yes Take 1 tablet (5 mg total) by mouth 2 (two) times daily. Nooruddin, Saad, MD  Active Self, Pharmacy Records  atorvastatin  (LIPITOR ) 80 MG tablet [id number] Yes Take 1 tablet (80 mg total) by  mouth daily. Nooruddin, Saad, MD  Active Self, Pharmacy Records  Blood Glucose Monitoring Suppl (ACCU-CHEK GUIDE) w/Device KIT [id number]  Use as directed twice daily.   Active Self, Pharmacy Records  carvedilol  (COREG ) 6.25 MG tablet [id number] Yes Take 1 tablet (6.25 mg total) by mouth 2 (two) times daily. Rolan Ezra RAMAN, MD  Active Self, Pharmacy Records  Continuous Glucose Sensor (FREESTYLE LIBRE 3 PLUS SENSOR) [place] [id number] Yes Change sensor every 15 days. Charmayne Holmes, DO  Active Self, Pharmacy Records  empagliflozin  (JARDIANCE ) 25 MG TABS tablet [id number] Yes Take 1 tablet (25 mg total) by mouth daily before breakfast. Nooruddin, Saad, MD  Active Self, Pharmacy Records  glucose blood (ACCU-CHEK GUIDE TEST) test strip [id number]  Use three times daily to check blood sugars Elnora Ip, MD  Active Self, Pharmacy Records  hydrALAZINE  (APRESOLINE ) 50 MG tablet [id number] Yes Take 1 tablet (50 mg total)  by mouth 3 (three) times daily. Rolan Ezra RAMAN, MD  Active Self, Pharmacy Records  icosapent  Ethyl (VASCEPA ) 1 g capsule [id number]  Take 2 capsules (2 g total) by mouth 2 (two) times daily.  Patient not taking: Reported on 06/19/2024   Rolan Ezra RAMAN, MD  Active Self, Pharmacy Records  Insulin  Pen Needle [facility] PENTIPS) 32G X 4 MM MISC [id number]  Use daily with insulin  Rosan Dayton BROCKS, DO  Active Self, Pharmacy Records  isosorbide  mononitrate (IMDUR ) 30 MG 24 hr tablet [id number] Yes Take 1 tablet (30 mg total) by mouth daily. Nooruddin, Saad, MD  Active Self, Pharmacy Records  Lancets Micro Thin 33G [place] [id number]  Use to check blood sugar twice daily Rosan Dayton BROCKS, DO  Active Self, Pharmacy Records  levETIRAcetam  (KEPPRA ) 250 MG tablet [id number]  Take 1 tablet (250 mg total) by mouth 2 (two) times daily.  Patient not taking: Reported on 06/19/2024   Fernand Prost, MD  Active Self, Pharmacy Records    Discontinued 09/13/20 1454 Multiple Vitamins-Minerals (CENTRUM SILVER ADULT 50+)  TABS [id number]  Take 1 tablet by mouth daily at 12 noon. [provider]  Active Self, Pharmacy Records  sacubitril -valsartan  (ENTRESTO ) 49-51 MG [id number] Yes Take 1 tablet by mouth 2 (two) times daily. Marylu Gee, DO  Active Self, Pharmacy Records  spironolactone  (ALDACTONE ) 25 MG tablet [id number] Yes Take 1 tablet (25 mg total) by mouth daily. Nooruddin, Saad, MD  Active Self, Pharmacy Records  torsemide  (DEMADEX ) 20 MG tablet [id number] Yes Take 1 tablet (20 mg total) by mouth daily. [place], Harlene HERO, FNP  Active Self, Pharmacy Records              Assessment/Plan:   Diabetes: - Currently controlled with current TIR of 78% over the past week above goal > 70%, in the setting of patient making diet/lifestyle changes to control BG and discontinuing maintenance medications including Lantus , Ozempic , and metformin . Most recent eGFR < 30 mL/min, so would not restart metformin . Advised patient that we would recommend restarting Ozempic  for CV protection, but he is not interested at this time. He is wiling to continue Jardiance , but this is unlikely to influence his glycemic control with advanced CKD. He is able to continue monitoring with CGM and will let us  know if glycemic control worsens. - Last UACR July 2025: 389 mg/dL. Patient is appropriately treated with SGLT2i - Reviewed long term cardiovascular and renal outcomes of uncontrolled blood sugar - Reviewed goal A1c, goal fasting, and goal 2 hour post prandial glucose - Reviewed hypoglycemia management plan and the rule of 15 - Reviewed dietary modifications including  utilizing the healthy plate method, limiting portion size of carbohydrate foods, increasing intake of protein and non-starchy vegetables. Encouraged him to eat 3 meals per day. Counseled patient to stay hydrated with water throughout the day. - Reviewed lifestyle modifications including: aiming for 150 minutes of moderate intensity exercise every week.  - Recommend  to continue Jardiance  25 mg daily - Recommend to check glucose continuously using FL3+ CGM - patient is doing very well with self monitoring, assisted in re-connecting to app today. Advised patient of recent recall of FL3+ CGMs for false low readings. Advised when to double check CGM with glucometer readings, and to let us  know if he has a faulty sensor so he can request a replacement. - Next A1C due 07/23/23    Heart Failure: - Currently appropriately managed, though patient's adherence appears suboptimal. BP today below goal < 130/80 mmHg. Last BMP  demonstrated slightly worsened Scr and kidney function. Should continue to monitor appropriateness of spironolactone . Difficult to assess adherence today, since he did not have his medication bottles with him. Anticipate he will have repeat labs soon with upcoming surgery.  - Reviewed appropriate blood pressure monitoring technique and reviewed goal blood pressure - Reviewed to weigh daily and when to contact cardiology with weight gain - Recommend to continue current regimen:   Current medications:  ACEi/ARB/ARNI: Entresto  49/51 mg BID SGLT2i: Jardiance  25 mg daily Beta blocker: carvedilol  6.25 mg BID  Mineralocorticoid Receptor Antagonist: spironolactone  25 mg daily Hydral/nitrate: hydralazine  50 mg TID, isosorbide  mononitrate 30 mg adily Diuretic regimen: torsemide  20 mg daily     Medication Management: - Currently strategy insufficient to maintain appropriate adherence to prescribed medication regimen. Patient exhibits difficulty managing his medications. Could consider pill packs in the future, once patient is on a stable regimen,  but did not initiate today given fluctuating kidney function.  - Patient reports that he was instructed to stop levetiracetam  (Keppra ) and has not filled since Sept 2024, though he never followed up with neurology to confirm this. Will discuss with PCP to decide whether to remove this from medication list.  - Due  for repeat lipid panel at PCP f/u, if LDL-C > 55 mg/dL should restart ezetimibe .  - Patient appears to be intermittently taking Vascepa . He has an indication to reduce CV risk with hx of TG > 150 mg/dL, but could consider discontinuing to manage pill burden.  - Will continue to follow for medication management   Written patient instructions provided. Patient verbalized understanding of treatment plan.    Follow Up Plan:  Pharmacist 07/31/24 PCP clinic visit needs to be scheduled   Lorain Baseman, PharmD [facility] [phone]

## 2024-06-19 NOTE — Patient Instructions (Addendum)
 It was nice to see you today! Please bring ALL your medications to your next appointment.   Your goal blood sugar is 80-130 mg/dL before eating and less than 180 mg/dL after eating. Continue using your continuous monitor  Medication Changes:  Continue all medication the same. Pick up your refill of Jardiance  and torsemide  today.  If you experience symptoms of a low blood sugar (dizzy, shaky, sweaty) check your blood sugar with your fingerstick glucometer. If it is less than 70 mg/dL, you should eat or drink 15 grams of fast-acting carbohydrates like 4 glucose tablets, 1/2 cup of regular soda, or 1/2 cup of juice. Recheck your blood sugar after 15 minutes. If the level is still below 70 mg/dL repeat the process. If this occurs frequently, you should notify your healthcare provider.   Lifestyle Recommendations:  Aim for 150 minutes of moderate intensity exercise every week. This is any activity that elevates your heart rate and breathing rate, but still allows you to carry on a conversation. Exercising after meals can help prevent spikes in your blood sugar.  Diet Recommendations:   Try to eat 3 real meals using the healthy plate method and 1-2 snacks per day. Never go more than 4-5 hours while awake without eating. Eat breakfast within the first hour of getting up.     Carbohydrates include starch, sugar, and fiber. Sugar and starch raise blood glucose.  Starchy foods include bread, rice, pasta, potatoes, corn, cereal, grits, crackers, bagels, muffins, all baked foods Some fruits are higher in sugar, like grapes, watermelon, oranges, and most tropical fruits. Limit your serving size to 1/2 cup at a time.  Protein foods include meat, fish, poultry, eggs, dairy, and beans (although beans also provide carbohydrates).  Non-starchy vegetables do not impact your blood sugar very much. These include greens, broccoli, asparagus, carrots, cauliflower, cucumber, mushrooms, peppers, yellow squash,  zucchini squash, tomato, to name a few!  Avoid all sugary beverages. Stay hydrated with water throughout the day. Sparkling/flavored water, unsweet tea, black coffee, and zero-sugar or diet drinks will not impact your blood sugar, but they should not be used as your only source of hydration!  You can find more information at https://diabetes.org/food-nutrition/eating-healthy   Rodney Schultz, PharmD [facility] [phone]

## 2024-06-21 ENCOUNTER — Ambulatory Visit (HOSPITAL_COMMUNITY)
Admission: RE | Admit: 2024-06-21 | Discharge: 2024-06-21 | Disposition: A | Discharge status: Home / Self Care | Source: Ambulatory Visit | Attending: Urology | Admitting: Urology

## 2024-06-21 ENCOUNTER — Ambulatory Visit (HOSPITAL_COMMUNITY): Payer: Self-pay | Admitting: Physician Assistant

## 2024-06-21 ENCOUNTER — Other Ambulatory Visit (HOSPITAL_COMMUNITY): Payer: Self-pay

## 2024-06-21 ENCOUNTER — Encounter (HOSPITAL_COMMUNITY): Payer: Self-pay | Admitting: Urology

## 2024-06-21 ENCOUNTER — Encounter (HOSPITAL_COMMUNITY)
Admission: RE | Disposition: A | Discharge status: Home / Self Care | Payer: Self-pay | Source: Ambulatory Visit | Attending: Urology

## 2024-06-21 DIAGNOSIS — Z79899 Other long term (current) drug therapy: Secondary | ICD-10-CM

## 2024-06-21 DIAGNOSIS — I2510 Atherosclerotic heart disease of native coronary artery without angina pectoris: Secondary | ICD-10-CM | POA: Diagnosis not present

## 2024-06-21 DIAGNOSIS — N50811 Right testicular pain: Secondary | ICD-10-CM

## 2024-06-21 DIAGNOSIS — E1122 Type 2 diabetes mellitus with diabetic chronic kidney disease: Secondary | ICD-10-CM

## 2024-06-21 DIAGNOSIS — N1832 Chronic kidney disease, stage 3b: Secondary | ICD-10-CM

## 2024-06-21 DIAGNOSIS — I130 Hypertensive heart and chronic kidney disease with heart failure and stage 1 through stage 4 chronic kidney disease, or unspecified chronic kidney disease: Secondary | ICD-10-CM

## 2024-06-21 DIAGNOSIS — I5022 Chronic systolic (congestive) heart failure: Secondary | ICD-10-CM

## 2024-06-21 DIAGNOSIS — E118 Type 2 diabetes mellitus with unspecified complications: Secondary | ICD-10-CM

## 2024-06-21 DIAGNOSIS — Z01818 Encounter for other preprocedural examination: Secondary | ICD-10-CM

## 2024-06-21 HISTORY — PX: ORCHIECTOMY: SHX2116

## 2024-06-21 LAB — COMPREHENSIVE METABOLIC PANEL WITH GFR
ALT: 10 U/L (ref 0–44)
AST: 14 U/L — ABNORMAL LOW (ref 15–41)
Albumin: 3.8 g/dL (ref 3.5–5.0)
Alkaline Phosphatase: 77 U/L (ref 38–126)
Anion gap: 11 (ref 5–15)
BUN: 35 mg/dL — ABNORMAL HIGH (ref 8–23)
CO2: 24 mmol/L (ref 22–32)
Calcium: 8.9 mg/dL (ref 8.9–10.3)
Chloride: 107 mmol/L (ref 98–111)
Creatinine, Ser: 2.44 mg/dL — ABNORMAL HIGH (ref 0.61–1.24)
GFR, Estimated: 29 mL/min — ABNORMAL LOW (ref >60)
Glucose, Bld: 124 mg/dL — ABNORMAL HIGH (ref 70–99)
Potassium: 3.8 mmol/L (ref 3.5–5.1)
Sodium: 141 mmol/L (ref 135–145)
Total Bilirubin: 0.2 mg/dL (ref 0.0–1.2)
Total Protein: 7.3 g/dL (ref 6.5–8.1)

## 2024-06-21 LAB — GLUCOSE, CAPILLARY
Glucose-Capillary: 181 mg/dL — ABNORMAL HIGH (ref 70–99)
Glucose-Capillary: 113 mg/dL — ABNORMAL HIGH (ref 70–99)

## 2024-06-21 SURGERY — ORCHIECTOMY
Anesthesia: General | Laterality: Right

## 2024-06-21 MED ORDER — FENTANYL CITRATE (PF) 50 MCG/ML IJ SOSY
25.0000 ug | PREFILLED_SYRINGE | INTRAMUSCULAR | Status: DC | PRN
  Administered 2024-06-21: 25 ug via INTRAVENOUS

## 2024-06-21 MED ORDER — CELECOXIB 100 MG PO CAPS
100.0000 mg | ORAL_CAPSULE | Freq: Two times a day (BID) | ORAL | 0 refills | Status: AC
  Filled 2024-06-21: qty 20, 10d supply, fill #0

## 2024-06-21 MED ORDER — MIDAZOLAM HCL 5 MG/5ML IJ SOLN
INTRAMUSCULAR | Status: DC | PRN
  Administered 2024-06-21: 2 mg via INTRAVENOUS

## 2024-06-21 MED ORDER — TRAMADOL HCL 50 MG PO TABS
50.0000 mg | ORAL_TABLET | Freq: Four times a day (QID) | ORAL | 0 refills | Status: AC | PRN
  Filled 2024-06-21: qty 12, 3d supply, fill #0

## 2024-06-21 MED ORDER — BUPIVACAINE HCL (PF) 0.25 % IJ SOLN
INTRAMUSCULAR | Status: DC | PRN
  Administered 2024-06-21: 5 mL

## 2024-06-21 MED ORDER — ONDANSETRON HCL 4 MG/2ML IJ SOLN
INTRAMUSCULAR | Status: DC | PRN
  Administered 2024-06-21: 4 mg via INTRAVENOUS

## 2024-06-21 MED ORDER — EPHEDRINE 5 MG/ML INJ
INTRAVENOUS | Status: AC
  Filled 2024-06-21: qty 5

## 2024-06-21 MED ORDER — BUPIVACAINE HCL (PF) 0.25 % IJ SOLN
INTRAMUSCULAR | Status: AC
  Filled 2024-06-21: qty 30

## 2024-06-21 MED ORDER — PROPOFOL 10 MG/ML IV BOLUS
INTRAVENOUS | Status: AC
  Filled 2024-06-21: qty 20

## 2024-06-21 MED ORDER — ONDANSETRON HCL 4 MG/2ML IJ SOLN: 4.0000 mg | Freq: Once | INTRAMUSCULAR | Status: DC | PRN

## 2024-06-21 MED ORDER — CHLORHEXIDINE GLUCONATE 0.12 % MT SOLN
15.0000 mL | Freq: Once | OROMUCOSAL | Status: AC
  Administered 2024-06-21: 15 mL via OROMUCOSAL

## 2024-06-21 MED ORDER — FENTANYL CITRATE (PF) 50 MCG/ML IJ SOSY
PREFILLED_SYRINGE | INTRAMUSCULAR | Status: AC
  Filled 2024-06-21: qty 1

## 2024-06-21 MED ORDER — EPHEDRINE SULFATE (PRESSORS) 25 MG/5ML IV SOSY
PREFILLED_SYRINGE | INTRAVENOUS | Status: DC | PRN
  Administered 2024-06-21: 15 mg via INTRAVENOUS
  Administered 2024-06-21: 10 mg via INTRAVENOUS
  Administered 2024-06-21: 20 mg via INTRAVENOUS

## 2024-06-21 MED ORDER — INSULIN ASPART 100 UNIT/ML IJ SOLN
0.0000 [IU] | INTRAMUSCULAR | Status: DC | PRN
  Administered 2024-06-21: 4 [IU] via SUBCUTANEOUS
  Filled 2024-06-21: qty 4

## 2024-06-21 MED ORDER — ACETAMINOPHEN 10 MG/ML IV SOLN: 1000.0000 mg | Freq: Once | INTRAVENOUS | Status: DC | PRN

## 2024-06-21 MED ORDER — LIDOCAINE HCL (PF) 2 % IJ SOLN
INTRAMUSCULAR | Status: AC
  Filled 2024-06-21: qty 5

## 2024-06-21 MED ORDER — LIDOCAINE HCL (PF) 1 % IJ SOLN
INTRAMUSCULAR | Status: AC
  Filled 2024-06-21: qty 30

## 2024-06-21 MED ORDER — LIDOCAINE HCL 1 % IJ SOLN
INTRAMUSCULAR | Status: DC | PRN
  Administered 2024-06-21: 5 mL

## 2024-06-21 MED ORDER — SODIUM CHLORIDE 0.9 % IV SOLN: INTRAVENOUS | Status: DC

## 2024-06-21 MED ORDER — FENTANYL CITRATE (PF) 100 MCG/2ML IJ SOLN
INTRAMUSCULAR | Status: AC
  Filled 2024-06-21: qty 2

## 2024-06-21 MED ORDER — PROPOFOL 10 MG/ML IV BOLUS
INTRAVENOUS | Status: DC | PRN
  Administered 2024-06-21: 160 mg via INTRAVENOUS

## 2024-06-21 MED ORDER — LIDOCAINE HCL (PF) 2 % IJ SOLN
INTRAMUSCULAR | Status: DC | PRN
  Administered 2024-06-21: 100 mg via INTRADERMAL

## 2024-06-21 MED ORDER — MIDAZOLAM HCL 2 MG/2ML IJ SOLN
INTRAMUSCULAR | Status: AC
  Filled 2024-06-21: qty 2

## 2024-06-21 MED ORDER — DEXAMETHASONE SOD PHOSPHATE PF 10 MG/ML IJ SOLN
INTRAMUSCULAR | Status: DC | PRN
  Administered 2024-06-21: 5 mg via INTRAVENOUS

## 2024-06-21 MED ORDER — OXYCODONE HCL 5 MG/5ML PO SOLN: 5.0000 mg | Freq: Once | ORAL | Status: AC | PRN

## 2024-06-21 MED ORDER — LACTATED RINGERS IV SOLN: INTRAVENOUS | Status: DC

## 2024-06-21 MED ORDER — OXYCODONE HCL 5 MG PO TABS
ORAL_TABLET | ORAL | Status: AC
  Filled 2024-06-21: qty 1

## 2024-06-21 MED ORDER — CEFAZOLIN SODIUM-DEXTROSE 2-4 GM/100ML-% IV SOLN
2.0000 g | INTRAVENOUS | Status: AC
  Administered 2024-06-21: 2 g via INTRAVENOUS
  Filled 2024-06-21: qty 100

## 2024-06-21 MED ORDER — OXYCODONE HCL 5 MG PO TABS
5.0000 mg | ORAL_TABLET | Freq: Once | ORAL | Status: AC | PRN
  Administered 2024-06-21: 5 mg via ORAL

## 2024-06-21 MED ORDER — ONDANSETRON HCL 4 MG/2ML IJ SOLN
INTRAMUSCULAR | Status: AC
  Filled 2024-06-21: qty 2

## 2024-06-21 MED ORDER — ORAL CARE MOUTH RINSE: 15.0000 mL | Freq: Once | OROMUCOSAL | Status: AC

## 2024-06-21 MED ORDER — FENTANYL CITRATE (PF) 100 MCG/2ML IJ SOLN
INTRAMUSCULAR | Status: DC | PRN
  Administered 2024-06-21: 50 ug via INTRAVENOUS

## 2024-06-21 SURGICAL SUPPLY — 40 items
BAG COUNTER SPONGE SURGICOUNT (BAG) IMPLANT
BLADE SURG 15 STRL LF DISP TIS (BLADE) ×1 IMPLANT
BNDG GAUZE DERMACEA FLUFF 4 (GAUZE/BANDAGES/DRESSINGS) ×1 IMPLANT
BRIEF MESH DISP LRG (UNDERPADS AND DIAPERS) IMPLANT
CHLORAPREP W/TINT 26 (MISCELLANEOUS) ×1 IMPLANT
COVER SURGICAL LIGHT HANDLE (MISCELLANEOUS) ×1 IMPLANT
DERMABOND ADVANCED .7 DNX12 (GAUZE/BANDAGES/DRESSINGS) ×1 IMPLANT
DRAIN PENROSE 0.25X18 (DRAIN) IMPLANT
DRAPE LAPAROTOMY T 98X78 PEDS (DRAPES) ×1 IMPLANT
DRAPE UTILITY XL STRL (DRAPES) ×1 IMPLANT
ELECT PENCIL ROCKER SW 15FT (MISCELLANEOUS) ×1 IMPLANT
ELECT REM PT RETURN 15FT ADLT (MISCELLANEOUS) ×1 IMPLANT
GAUZE 4X4 16PLY ~~LOC~~+RFID DBL (SPONGE) ×1 IMPLANT
GLOVE SS BIOGEL STRL SZ 7 (GLOVE) ×1 IMPLANT
GOWN STRL REUS W/ TWL XL LVL3 (GOWN DISPOSABLE) ×1 IMPLANT
KIT BASIN OR (CUSTOM PROCEDURE TRAY) ×1 IMPLANT
KIT TURNOVER KIT A (KITS) ×1 IMPLANT
LIGASURE IMPACT 36 18CM CVD LR (INSTRUMENTS) IMPLANT
NDL HYPO 22X1.5 SAFETY MO (MISCELLANEOUS) ×1 IMPLANT
PACK BASIC VI WITH GOWN DISP (CUSTOM PROCEDURE TRAY) ×1 IMPLANT
SPIKE FLUID TRANSFER (MISCELLANEOUS) ×1 IMPLANT
SUPPORTER AHLETIC TETRA LG (SOFTGOODS) ×1 IMPLANT
SUT CHROMIC 3 0 SH 27 (SUTURE) IMPLANT
SUT CHROMIC 4 0 SH 27 (SUTURE) IMPLANT
SUT MNCRL AB 3-0 PS2 18 (SUTURE) IMPLANT
SUT MNCRL AB 4-0 PS2 18 (SUTURE) ×1 IMPLANT
SUT PDS AB 3-0 SH 27 (SUTURE) IMPLANT
SUT PDS AB 4-0 SH 27 (SUTURE) IMPLANT
SUT SILK 2 0 SH (SUTURE) IMPLANT
SUT SILK 2-0 18XBRD TIE 12 (SUTURE) IMPLANT
SUT SILK 3 0 SH 30 (SUTURE) IMPLANT
SUT SILK 3-0 18XBRD TIE 12 (SUTURE) IMPLANT
SUT SILK 4-0 12X30IN (SUTURE) IMPLANT
SUT VIC AB 2-0 SH 27XBRD (SUTURE) ×1 IMPLANT
SUT VIC AB 3-0 SH 27XBRD (SUTURE) ×1 IMPLANT
SYR BULB IRRIG 60ML STRL (SYRINGE) ×1 IMPLANT
SYR CONTROL 10ML LL (SYRINGE) ×1 IMPLANT
TOWEL OR 17X26 10 PK STRL BLUE (TOWEL DISPOSABLE) ×1 IMPLANT
WATER STERILE IRR 1000ML POUR (IV SOLUTION) ×1 IMPLANT
YANKAUER SUCT BULB TIP 10FT TU (MISCELLANEOUS) ×1 IMPLANT

## 2024-06-21 NOTE — H&P (Signed)
 H&P  History of Present Illness: Rodney Schultz is a 63 y.o. year old M who presents today for right orchiectomy for painful inguinal right testicle  Past Medical History:  Diagnosis Date   Arthritis    CHF (congestive heart failure) (HCC)    Diabetes mellitus without complication (HCC)    GERD (gastroesophageal reflux disease)    Hypertension     Past Surgical History:  Procedure Laterality Date   RIGHT HEART CATH N/A 08/11/2021   Procedure: RIGHT HEART CATH;  Surgeon: Rolan Ezra RAMAN, MD;  Location: [facility] INVASIVE CV LAB;  Service: Cardiovascular;  Laterality: N/A;   testical  Right     Home Medications:  Current Meds  Medication Sig   apixaban  (ELIQUIS ) 5 MG TABS tablet Take 1 tablet (5 mg total) by mouth 2 (two) times daily.   atorvastatin  (LIPITOR ) 80 MG tablet Take 1 tablet (80 mg total) by mouth daily.   Blood Glucose Monitoring Suppl (ACCU-CHEK GUIDE) w/Device KIT Use as directed twice daily.   carvedilol  (COREG ) 6.25 MG tablet Take 1 tablet (6.25 mg total) by mouth 2 (two) times daily.   Continuous Glucose Sensor (FREESTYLE LIBRE 3 PLUS SENSOR) MISC Change sensor every 15 days.   empagliflozin  (JARDIANCE ) 25 MG TABS tablet Take 1 tablet (25 mg total) by mouth daily before breakfast.   glucose blood (ACCU-CHEK GUIDE TEST) test strip Use three times daily to check blood sugars   hydrALAZINE  (APRESOLINE ) 50 MG tablet Take 1 tablet (50 mg total) by mouth 3 (three) times daily.   Insulin  Pen Needle (UNIFINE PENTIPS) 32G X 4 MM MISC Use daily with insulin    isosorbide  mononitrate (IMDUR ) 30 MG 24 hr tablet Take 1 tablet (30 mg total) by mouth daily.   Lancets Micro Thin 33G MISC Use to check blood sugar twice daily   Multiple Vitamins-Minerals (CENTRUM SILVER ADULT 50+) TABS Take 1 tablet by mouth daily at 12 noon.   sacubitril -valsartan  (ENTRESTO ) 49-51 MG Take 1 tablet by mouth 2 (two) times daily.   spironolactone  (ALDACTONE ) 25 MG tablet Take 1 tablet (25 mg total) by mouth  daily.   torsemide  (DEMADEX ) 20 MG tablet Take 1 tablet (20 mg total) by mouth daily.    Allergies:  Allergies  Allergen Reactions   Influenza Vaccines     Sick- was put in hospital     Family History  Problem Relation Age of Onset   Healthy Mother     Social History:  reports that he has quit smoking. His smoking use included cigarettes. He started smoking about 2 years ago. He has a 3.6 pack-year smoking history. He has never used smokeless tobacco. He reports that he does not currently use alcohol. He reports that he does not use drugs.  ROS: A complete review of systems was performed.  All systems are negative except for pertinent findings as noted.  Physical Exam:  Vital signs in last 24 hours: Temp:  [98.3 F (36.8 C)] 98.3 F (36.8 C) (12/03 9361) Pulse Rate:  [55] 55 (12/03 0638) BP: (127)/(83) 127/83 (12/03 9361) SpO2:  [99 %] 99 % (12/03 9361) Weight:  [85 kg-85.4 kg] 85 kg (12/03 0732) Constitutional:  Alert and oriented, No acute distress Cardiovascular: Regular rate and rhythm Respiratory: Normal respiratory effort, Lungs clear bilaterally GI: Abdomen is soft, nontender, nondistended, no abdominal masses Lymphatic: No lymphadenopathy Neurologic: Grossly intact, no focal deficits Psychiatric: Normal mood and affect   Laboratory Data:  No results for input(s): WBC, HGB, HCT, PLT in  the last 72 hours.  No results for input(s): NA, K, CL, GLUCOSE, BUN, CALCIUM , CREATININE in the last 72 hours.  Invalid input(s): CO3   Results for orders placed or performed during the hospital encounter of 06/21/24 (from the past 24 hours)  Glucose, capillary     Status: Abnormal   Collection Time: 06/21/24  6:42 AM  Result Value Ref Range   Glucose-Capillary 181 (H) 70 - 99 mg/dL   Comment 1 Notify RN    No results found for this or any previous visit (from the past 240 hours).  Renal Function: No results for input(s): CREATININE in the last  168 hours. Estimated Creatinine Clearance: 27.6 mL/min (A) (by C-G formula based on SCr of 2.76 mg/dL (H)).  Radiologic Imaging: No results found.  Assessment:  Rodney Schultz is a 63 y.o. year old M with painful inguinal right testicle  Plan:  To OR for orchiectomy as planned. Procedure and risks reviewed, including but not limited bleeding, infection, persistent pain, hematoma, wound healing issues, swelling, decreased testosterone  Herlene Foot, MD 06/21/2024, 8:18 AM  [facility] Pager: [phone]

## 2024-06-21 NOTE — Anesthesia Procedure Notes (Signed)
 Procedure Name: LMA Insertion Date/Time: 06/21/2024 8:35 AM  Performed by: Carleton Garnette SAUNDERS, CRNAPre-anesthesia Checklist: Patient identified, Emergency Drugs available, Suction available, Patient being monitored and Timeout performed Patient Re-evaluated:Patient Re-evaluated prior to induction Oxygen Delivery Method: Circle system utilized Preoxygenation: Pre-oxygenation with 100% oxygen Induction Type: IV induction LMA: LMA inserted LMA Size: 5.0 Tube type: Oral Number of attempts: 1 Placement Confirmation: positive ETCO2 and breath sounds checked- equal and bilateral Tube secured with: Tape Dental Injury: Teeth and Oropharynx as per pre-operative assessment

## 2024-06-21 NOTE — Transfer of Care (Signed)
 Immediate Anesthesia Transfer of Care Note  Patient: Rodney Schultz  Procedure(s) Performed: ORCHIECTOMY (Right)  Patient Location: PACU  Anesthesia Type:General  Level of Consciousness: sedated  Airway & Oxygen Therapy: Patient Spontanous Breathing and Patient connected to face mask oxygen  Post-op Assessment: Report given to RN and Post -op Vital signs reviewed and stable  Post vital signs: Reviewed and stable  Last Vitals:  Vitals Value Taken Time  BP 139/83 06/21/24 09:36  Temp    Pulse 51 06/21/24 09:37  Resp 15 06/21/24 09:37  SpO2 100 % 06/21/24 09:37  Vitals shown include unfiled device data.  Last Pain:  Vitals:   06/21/24 0732  TempSrc:   PainSc: 0-No pain         Complications: No notable events documented.

## 2024-06-21 NOTE — Op Note (Signed)
 Preoperative diagnosis:  1. Right testicular pain 2. undescended right testicle  Postoperative diagnosis: same  Procedure(s): 1. Right Inguinal orchiectomy  Surgeon: Dr. Herlene Foot  Anesthesia: general  Complications: none  EBL: 10 cc  Specimens: Right testicle and spermatic cord  Disposition of specimens: path  Intraoperative findings: testicle identified in low inguinal region, stuck to old scar. Dissected free intact  Description of procedure:  With appropriate consent obtained the patient was brought to the operating room where anesthesia was induced.  Prepped and draped in the usual sterile fashion in a supine position.  Timeout was performed.  On exam patient had a scar in the midline of the suprapubic region extending down into the upper right hemiscrotum.  The testicle was palpable just superior and lateral to this in the luminal region.  I marked off a high subinguinal incision and infiltrated this with local.  I then used a scalpel and Bovie to make an incision and dissected the layers of the abdomen here.  We encountered the spermatic cord.  It was grasped with a Babcock and brought out through the incision.  A Penrose was placed underneath.  We were then able to deliver the testicle into the operative field.  As mentioned above it was matted to the old scar and so I used the Bovie to dissect it free.  Once it was freed, I divided the spermatic cord into 2 bundles and a hemostat was placed around each.  The spermatic cord was divided with the Bovie and testicle was sent for pathology.  Each spermatic cord bundle was then doubly ligated with a 2-0 silk tie and 2-0 stick tie.  There inspected for hemostasis.  The wound was then irrigated with saline and Bovie was used to ensure hemostasis.  The incision was then closed in layers and Dermabond applied.  Patient Toller procedure well was brought to the PACU in stable condition  Herlene Foot MD 06/21/2024, 9:32 AM   [facility]  Pager: [phone]

## 2024-06-21 NOTE — Anesthesia Preprocedure Evaluation (Signed)
 Anesthesia Evaluation  Patient identified by MRN, date of birth, ID band Patient awake    Reviewed: Allergy & Precautions, NPO status , Patient's Chart, lab work & pertinent test results, reviewed documented beta blocker date and time   History of Anesthesia Complications Negative for: history of anesthetic complications  Airway Mallampati: II  TM Distance: >3 FB     Dental  (+) Edentulous Upper, Edentulous Lower   Pulmonary neg COPD, former smoker, neg PE   breath sounds clear to auscultation       Cardiovascular hypertension, + CAD and +CHF  (-) Past MI and (-) Cardiac Stents + dysrhythmias Atrial Fibrillation  Rhythm:Regular Rate:Normal  LHC (1/23) with diffuse disease occluded large ramus and moderate D1. 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 No good PCI options in setting of diffuse disease and no chest pain, and he does not have good targets for CABG   IMPRESSIONS     1. Left ventricular ejection fraction, by estimation, is 40 to 45%. The  left ventricle has mildly decreased function. The left ventricle  demonstrates regional wall motion abnormalities (see scoring  diagram/findings for description). Left ventricular  diastolic parameters are consistent with Grade I diastolic dysfunction  (impaired relaxation). There is global hypokinesis. Cannot rule out severe  hypokinesis to akinesis of the left ventricular, apical segment due to  poor endocardial definition. The  average left ventricular global longitudinal strain is -10.4 %. The global  longitudinal strain is abnormal.   2. Right ventricular systolic function is mildly reduced. The right  ventricular size is normal.   3. The mitral valve is normal in structure. Mild mitral valve  regurgitation. No evidence of mitral stenosis.   4. The aortic valve is tricuspid. Aortic valve regurgitation is not  visualized. Aortic valve  sclerosis/calcification is present, without any  evidence of aortic stenosis.   5. The inferior vena cava is normal in size with greater than 50%  respiratory variability, suggesting right atrial pressure of 3 mmHg.   6. Ascending aorta measurements are within normal limits for age when  indexed to body surface area.   7. Recommend limited echo with definity  contrast.     Neuro/Psych Seizures -,     GI/Hepatic ,GERD  ,,(+) neg Cirrhosis        Endo/Other  diabetes, Type 2    Renal/GU CRFRenal disease     Musculoskeletal  (+) Arthritis ,    Abdominal   Peds  Hematology   Anesthesia Other Findings   Reproductive/Obstetrics                              Anesthesia Physical Anesthesia Plan  ASA: 3  Anesthesia Plan: General   Post-op Pain Management:    Induction: Intravenous  PONV Risk Score and Plan: 1 and Ondansetron  and Dexamethasone  Airway Management Planned: LMA  Additional Equipment:   Intra-op Plan:   Post-operative Plan: Extubation in OR  Informed Consent: I have reviewed the patients History and Physical, chart, labs and discussed the procedure including the risks, benefits and alternatives for the proposed anesthesia with the patient or authorized representative who has indicated his/her understanding and acceptance.     Dental advisory given  Plan Discussed with: CRNA  Anesthesia Plan Comments:          Anesthesia Quick Evaluation

## 2024-06-21 NOTE — Discharge Instructions (Signed)
 HOME CARE INSTRUCTIONS FOR SCROTAL PROCEDURES  Wound Care & Hygiene: -You should apply an ice bag to the scrotum for the first 24-48 hours.  This may help decrease swelling and soreness. Apply the ice for 20 minutes on, then remove for 20 minutes. Avoid direct ice to skin contact.  -You may have a dressing held in place by an athletic supporter.  You may remove the dressing in 24 hours and shower in 48 hours.   -Continue to use the athletic supporter or tight fitting underwear (briefs, compression shorts) for at least a week.  Activity: Rest today - not necessarily flat bed rest.  Just take it easy.  You should not do strenuous activities until your follow-up visit with your doctor.  You may resume light activity in 48 hours.  Return to Work:  Your doctor will advise you of this depending on the type of work you do  Diet: Drink liquids or eat a light diet this evening.  You may resume a regular diet tomorrow.  General Expectations: You may have a small amount of bleeding.  The scrotum may be swollen or bruised for about a week.  Call your Doctor if these occur:  -persistent or heavy bleeding  -temperature of 101 degrees or more  -severe pain, not relieved by your pain medication  Return to [facility]:  Call to set up and appointment.

## 2024-06-22 ENCOUNTER — Encounter (HOSPITAL_COMMUNITY): Payer: Self-pay | Admitting: Urology

## 2024-06-22 NOTE — Anesthesia Postprocedure Evaluation (Signed)
 Anesthesia Post Note  Patient: Rodney Schultz  Procedure(s) Performed: ORCHIECTOMY (Right)     Patient location during evaluation: PACU Anesthesia Type: General Level of consciousness: awake and alert Pain management: pain level controlled Vital Signs Assessment: post-procedure vital signs reviewed and stable Respiratory status: spontaneous breathing, nonlabored ventilation, respiratory function stable and patient connected to nasal cannula oxygen Cardiovascular status: blood pressure returned to baseline and stable Postop Assessment: no apparent nausea or vomiting Anesthetic complications: no   No notable events documented.  Last Vitals:  Vitals:   06/21/24 1030 06/21/24 1040  BP: (!) 146/86 (!) 141/86  Pulse: (!) 55 (!) 56  Resp: 17 18  Temp: (!) 36.3 C (!) 36.3 C  SpO2: 97% 97%    Last Pain:  Vitals:   06/21/24 1040  TempSrc:   PainSc: 0-No pain                 Lynwood MARLA Cornea

## 2024-06-23 LAB — SURGICAL PATHOLOGY

## 2024-07-31 ENCOUNTER — Other Ambulatory Visit (HOSPITAL_COMMUNITY): Payer: Self-pay

## 2024-07-31 ENCOUNTER — Other Ambulatory Visit (HOSPITAL_COMMUNITY): Payer: Self-pay | Admitting: Family Medicine

## 2024-07-31 ENCOUNTER — Ambulatory Visit

## 2024-07-31 VITALS — BP 123/88 | HR 60 | Wt 182.6 lb

## 2024-07-31 DIAGNOSIS — I63512 Cerebral infarction due to unspecified occlusion or stenosis of left middle cerebral artery: Secondary | ICD-10-CM

## 2024-07-31 DIAGNOSIS — E118 Type 2 diabetes mellitus with unspecified complications: Secondary | ICD-10-CM

## 2024-07-31 DIAGNOSIS — E785 Hyperlipidemia, unspecified: Secondary | ICD-10-CM

## 2024-07-31 DIAGNOSIS — I5022 Chronic systolic (congestive) heart failure: Secondary | ICD-10-CM

## 2024-07-31 MED ORDER — TORSEMIDE 20 MG PO TABS
20.0000 mg | ORAL_TABLET | Freq: Every day | ORAL | 1 refills | Status: AC
  Filled 2024-07-31: qty 30, 30d supply, fill #0

## 2024-07-31 MED ORDER — APIXABAN 5 MG PO TABS
5.0000 mg | ORAL_TABLET | Freq: Two times a day (BID) | ORAL | 2 refills | Status: AC
  Filled 2024-07-31: qty 180, 90d supply, fill #0

## 2024-07-31 MED ORDER — SACUBITRIL-VALSARTAN 49-51 MG PO TABS
1.0000 | ORAL_TABLET | Freq: Two times a day (BID) | ORAL | 2 refills | Status: AC
  Filled 2024-07-31: qty 180, 90d supply, fill #0

## 2024-07-31 MED ORDER — OZEMPIC (0.25 OR 0.5 MG/DOSE) 2 MG/3ML ~~LOC~~ SOPN
0.2500 mg | PEN_INJECTOR | SUBCUTANEOUS | 3 refills | Status: AC
  Filled 2024-07-31: qty 3, 56d supply, fill #0

## 2024-07-31 NOTE — Patient Instructions (Signed)
 It was nice to see you today!  Your goal blood sugar is 80-130 mg/dL before eating and less than 180 mg/dL after eating. Continue using your [place] sensors. You can pick up a refill from the pharmacy today.  Your goal blood pressure is less than 130/80 mmHg. Keep a log to bring to appointments.  Medication Changes: Restart Ozempic  0.25 mg once weekly. Let us  know if you experience side effects like nausea, vomiting, or severe stomach pain. We will keep you on the lowest dose for now to avoid weight loss.   Restart carvedilol  6.25 mg twice daily.   Continue all other medication the same.   Lifestyle Recommendations:  Aim for 150 minutes of moderate intensity exercise every week. This is any activity that elevates your heart rate and breathing rate, but still allows you to carry on a conversation. Exercising after meals can help prevent spikes in your blood sugar.  Diet Recommendations:   Try to eat 3 real meals using the healthy plate method and 1-2 snacks per day. Never go more than 4-5 hours while awake without eating. Eat breakfast within the first hour of getting up.     Carbohydrates include starch, sugar, and fiber. Sugar and starch raise blood glucose.  Starchy foods include bread, rice, pasta, potatoes, corn, cereal, grits, crackers, bagels, muffins, all baked foods Some fruits are higher in sugar, like grapes, watermelon, oranges, and most tropical fruits. Limit your serving size to 1/2 cup at a time.  Protein foods include meat, fish, poultry, eggs, dairy, and beans (although beans also provide carbohydrates).  Non-starchy vegetables do not impact your blood sugar very much. These include greens, broccoli, asparagus, carrots, cauliflower, cucumber, mushrooms, peppers, yellow squash, zucchini squash, tomato, to name a few!  Avoid all sugary beverages. Stay hydrated with water throughout the day. Sparkling/flavored water, unsweet tea, black coffee, and zero-sugar or diet drinks  will not impact your blood sugar, but they should not be used as your only source of hydration!  You can find more information at https://diabetes.org/food-nutrition/eating-healthy   Lorain Baseman, PharmD [facility] [phone]

## 2024-07-31 NOTE — Progress Notes (Signed)
 "  07/31/2024 Name: Rodney Schultz MRN: [id number] DOB: 06-21-1961  Chief Complaint  Patient presents with   Diabetes   Hypertension   Congestive Heart Failure    Rodney Schultz is a 64 y.o. year old male who was referred for medication management by their primary care provider, [name], DO. They presented for a face to face visit today.   They were referred to the pharmacist by their PCP for assistance in managing diabetes . PMH includes HTN, HFrEF (EF initially 20-25% in Dec 2022, improved to 40-45% in August 2024 with mildly reduced RV), T2DM (HHS in Jan 2024), CKD3b, HLD, hx of tobacco use, CVA (2024), seizure (2024, - same admission as CVA), CAD (R/LHC in 2023 with diffuse disease, medically managed), Afib.   Subjective: Patient was last seen by PCP, Dr. [name] on 05/22/24. BP was elevated, but patient reports he had not taken his BP medications that morning. He continued to hold his diabetes medications. Patient has upcoming R radial orchiectomy. Previously patient had brought medications with him to pharmacy clinic, and had current supplies of most of his medications, despite being overdue for refills, indicating intermittent adherence. At pharmacy visit on 06/19/24, BG remained controlled despite only taking Jardiance  25 mg daily. Assisted in restarting CGM.   Today, patient reports doing well. Brings his medication bottles with him. He is completely out of carvedilol . He is running low on spironolactone , hydralazine , atorvastatin , Eliquis , Entresto , and isosorbide . He took his medications at 7:30AM. Reports he is only taking torsemide  as needed, maybe every other day. He also only has 1 FL3+ CGM remaining.   Care Team: Primary Care Provider: [name], DO ; Next Scheduled Visit: needs to be scheduled ~Jan 2026   Medication Access/Adherence  Current Pharmacy:  [place] [facility] [address] [place] [address] Phone:  [phone] Fax: [phone]  [facility] [address] [place] [address] Phone: [phone] Fax: [phone]   Patient reports affordability concerns with their medications: No  Patient reports access/transportation concerns to their pharmacy: No  - Using [facility] pharmacy Patient reports adherence concerns with their medications:  Yes  - brought meds with him today. Was overdue for refills for Eliquis , carvedilol , hydralazine , and Entresto , though patient denies missed doses.  He previously reported that he uses a pill box to organize his medications, but instead of filling the pill box with the medications he needs to take each day, he pours the contents of his bottle into one slot (ex: puts hydralazine  in the Saturday slot, etc - then goes down the line and takes each pill). Reports that he lines the medication bottles that don't fit next to it to take. Previously reported that he can remember which pills he needs to take twice daily.   Diabetes:  Current medications:  Jardiance  25 mg daily, states he took Ozempic  0.5 mg x1 about 1 week ago because blood sugars rose > 200 mg/dL - he is now out of Ozempic , but is interested in potentially restarting  Medications tried in the past: glipizide , Novolog   Current glucose readings: Freestyle Libre 3+  Date of Download: 07/31/24      Observed patterns: Patient has been able to maintain glycemic control without insulin . Very few post-prandial BG spikes.  Patient denies hypoglycemic s/sx including dizziness, shakiness, sweating. Patient denies hyperglycemic symptoms including polyuria, polydipsia, polyphagia, nocturia, neuropathy, blurred vision.  Current meal patterns: Sometimes only eats 1 meal per  day. [name] reports that he had fried fish for dinner, no other meals.  - Drinks: water and diet soda. Denies intake of juice or regular soda. Reports he was previously drinking Pepsi and adding sugar  to his coffee. - Snacking: 4-5 grapes  Current physical activity: Yard work, walking  Current medication access support: UHC Dual Complete   Heart Failure (EF 40-45% in Aug 2024, improved from 20-25% in 2022):  Current medications:  ACEi/ARB/ARNI: Entresto  49/51 mg BID  SGLT2i: Jardiance  25 mg daily Beta blocker: carvedilol  6.25 mg BID (out for several weeks) Mineralocorticoid Receptor Antagonist: spironolactone  25 mg daily  Hydral/nitrate: hydralazine  50 mg TID + isosorbide  mononitrate 30 mg daily Diuretic regimen: torsemide  20 mg daily (taking every other day, or PRN)   Current home blood pressure readings: Reports BP is < 130/80 mmHg at home,  recalls reading of 111/80 mmHg.  Patient denies volume overload signs or symptoms including shortness of breath, lower extremity edema, increased use of pillows at night   Objective:  BP Readings from Last 3 Encounters:  07/31/24 123/88  06/21/24 (!) 141/86  06/19/24 114/80    Lab Results  Component Value Date   HGBA1C 9.0 (H) 06/05/2024   HGBA1C 11.4 (A) 05/01/2024   HGBA1C 7.0 (A) 01/20/2024       Latest Ref Rng & Units 06/21/2024    9:53 AM 06/05/2024    8:30 AM 04/10/2024   11:13 AM  BMP  Glucose 70 - 99 mg/dL 875  831  812   BUN 8 - 23 mg/dL 35  41  25   Creatinine 0.61 - 1.24 mg/dL 7.55  7.23  7.82   BUN/Creat Ratio 10 - 24   12   Sodium 135 - 145 mmol/L 141  140  136   Potassium 3.5 - 5.1 mmol/L 3.8  4.4  4.9   Chloride 98 - 111 mmol/L 107  104  101   CO2 22 - 32 mmol/L 24  23  19    Calcium  8.9 - 10.3 mg/dL 8.9  9.8  9.7     Lab Results  Component Value Date   CHOL 90 04/14/2023   HDL 20 (L) 04/14/2023   LDLCALC 41 04/14/2023   LDLDIRECT 73 08/20/2022   TRIG 146 04/14/2023   CHOLHDL 4.5 04/14/2023    Medications Reviewed Today     Reviewed by Brinda Lorain SQUIBB, RPH-CPP (Pharmacist) on 07/31/24 at 1327  Med List Status: <None>   Medication Order Taking? Sig Documenting Provider Last Dose Status Informant   apixaban  (ELIQUIS ) 5 MG TABS tablet [id number] Yes Take 1 tablet (5 mg total) by mouth 2 (two) times daily. Lovie Clarity, MD  Active   atorvastatin  (LIPITOR ) 80 MG tablet [id number] Yes Take 1 tablet (80 mg total) by mouth daily. Nooruddin, Saad, MD  Active Self, Pharmacy Records  Blood Glucose Monitoring Suppl (ACCU-CHEK GUIDE) w/Device KIT [id number]  Use as directed twice daily.   Active Self, Pharmacy Records           Med Note [facility], EMILY M   Wed Jun 21, 2024  7:33 AM) Used PTA  carvedilol  (COREG ) 6.25 MG tablet [id number]  Take 1 tablet (6.25 mg total) by mouth 2 (two) times daily.  Patient not taking: Reported on 07/31/2024   Rolan Ezra RAMAN, MD  Active Self, Pharmacy Records  celecoxib  (CELEBREX ) 100 MG capsule [id number]  Take 1 capsule (100 mg total) by mouth 2 (two) times daily. Lovie Arlyss CROME, MD  Active   Continuous  Glucose Sensor (FREESTYLE LIBRE 3 PLUS SENSOR) MISC [id number] Yes Change sensor every 15 days. Charmayne Holmes, DO  Active Self, Pharmacy Records           Med Note [facility], EMILY M   Wed Jun 21, 2024  7:34 AM) Currently wearing left side  empagliflozin  (JARDIANCE ) 25 MG TABS tablet [id number] Yes Take 1 tablet (25 mg total) by mouth daily before breakfast. Nooruddin, Saad, MD  Active Self, Pharmacy Records  glucose blood (ACCU-CHEK GUIDE TEST) test strip [id number]  Use three times daily to check blood sugars Elnora Ip, MD  Active Self, Pharmacy Records  hydrALAZINE  (APRESOLINE ) 50 MG tablet [id number] Yes Take 1 tablet (50 mg total) by mouth 3 (three) times daily. Rolan Ezra RAMAN, MD  Active Self, Pharmacy Records  icosapent  Ethyl (VASCEPA ) 1 g capsule [id number]  Take 2 capsules (2 g total) by mouth 2 (two) times daily.  Patient not taking: Reported on 06/19/2024   Rolan Ezra RAMAN, MD  Active Self, Pharmacy Records  Insulin  Pen Needle [facility] PENTIPS) 32G X 4 MM MISC [id number]  Use daily with insulin  Rosan Dayton BROCKS, DO  Active Self, Pharmacy Records   isosorbide  mononitrate (IMDUR ) 30 MG 24 hr tablet [id number] Yes Take 1 tablet (30 mg total) by mouth daily. Nooruddin, Saad, MD  Active Self, Pharmacy Records  Lancets Micro Thin 33G [place] [id number]  Use to check blood sugar twice daily Rosan Dayton BROCKS, DO  Active Self, Pharmacy Records   Patient not taking:   Discontinued 07/31/24 1327 (Expired Prescription)   Discontinued 09/13/20 1454 Multiple Vitamins-Minerals (CENTRUM SILVER ADULT 50+) TABS [id number]  Take 1 tablet by mouth daily at 12 noon. [provider]  Active Self, Pharmacy Records  sacubitril -valsartan  (ENTRESTO ) 49-51 MG [id number] Yes Take 1 tablet by mouth 2 (two) times daily. Lovie Clarity, MD  Active   Semaglutide ,0.25 or 0.5MG /DOS, (OZEMPIC , 0.25 OR 0.5 MG/DOSE,) 2 MG/3ML SOPN [id number] Yes Inject 0.25 mg into the skin once a week. Lovie Clarity, MD  Active   spironolactone  (ALDACTONE ) 25 MG tablet [id number] Yes Take 1 tablet (25 mg total) by mouth daily. Nooruddin, Saad, MD  Active Self, Pharmacy Records  torsemide  (DEMADEX ) 20 MG tablet [id number] Yes Take 1 tablet (20 mg total) by mouth daily. Lovie Clarity, MD  Active               Assessment/Plan:   Diabetes: - Currently controlled with current TIR of 96% over the past week above goal > 70%, on Jardiance  monotherapy. He did take a dose of Ozempic  0.5 mg last week due to noticing a few BG spikes. He would be interested in restarting Ozempic , which I agree with due to benefits of CV protection and slowing progression of CKD. However, appetite is low at baseline so would prefer to initiate at 0.25 mg weekly and monitor for side effects. He is able to continue monitoring with CGM and will let us  know if glycemic control worsens. - Last UACR July 2025: 389 mg/dL. Patient is appropriately treated with SGLT2i - Reviewed long term cardiovascular and renal outcomes of uncontrolled blood sugar - Reviewed goal A1c, goal fasting, and goal 2 hour post prandial glucose -  Reviewed hypoglycemia management plan and the rule of 15 - Reviewed dietary modifications including  utilizing the healthy plate method, limiting portion size of carbohydrate foods, increasing intake of protein and non-starchy vegetables. Encouraged him to eat 3 meals per day. Counseled patient to stay hydrated with water throughout the day. - Reviewed  lifestyle modifications including: aiming for 150 minutes of moderate intensity exercise every week.  - Recommend to continue Jardiance  25 mg daily - Recommend to restart Ozempic  at 0.25 mg weekly. Collaborated with [facility] pharmacy to fill today.  - Recommend to check glucose continuously using FL3+ CGM - patient is doing very well with self monitoring. Collaborated with [facility] to fill today. - Next A1C due 09/05/24, obtained lipid panel today    Heart Failure: - Currently appropriately managed, though patient's adherence appears suboptimal. BP today above goal < 130/80 mmHg, but he is out of carvedilol . HR is 60 today without beta blocker, should continue to monitor. Last eGFR was 29 mL/min, just below cut off for spironolactone . K WNL. Will discuss with PCP. Encouraged patient to schedule cardiology follow-up.  - Reviewed appropriate blood pressure monitoring technique and reviewed goal blood pressure - Reviewed to weigh daily and when to contact cardiology with weight gain - Recommend to continue current regimen:   Current medications:  ACEi/ARB/ARNI: Entresto  49/51 mg BID SGLT2i: Jardiance  25 mg daily Beta blocker: carvedilol  6.25 mg BID  Mineralocorticoid Receptor Antagonist: spironolactone  25 mg daily - may need to discontinue with eGFR < 30 mL/min Hydral/nitrate: hydralazine  50 mg TID, isosorbide  mononitrate 30 mg daily Diuretic regimen: torsemide  20 mg daily (ok to continue taking PRN)   Written patient instructions provided. Patient verbalized understanding of treatment plan.    Follow Up Plan:  Pharmacist 09/11/24 PCP clinic visit  10/09/24   Lorain Baseman, PharmD [facility] [phone]   "

## 2024-08-01 ENCOUNTER — Ambulatory Visit: Payer: Self-pay

## 2024-08-01 LAB — LIPID PANEL
Chol/HDL Ratio: 5 ratio (ref 0.0–5.0)
Cholesterol, Total: 116 mg/dL (ref 100–199)
HDL: 23 mg/dL — ABNORMAL LOW
LDL Chol Calc (NIH): 56 mg/dL (ref 0–99)
Triglycerides: 228 mg/dL — ABNORMAL HIGH (ref 0–149)
VLDL Cholesterol Cal: 37 mg/dL (ref 5–40)

## 2024-09-11 ENCOUNTER — Ambulatory Visit: Payer: Self-pay

## 2024-10-09 ENCOUNTER — Ambulatory Visit: Payer: Self-pay | Admitting: Student
